# Patient Record
Sex: Female | Born: 1935 | Race: White | Hispanic: No | Smoking: Never smoker
Health system: Southern US, Community
[De-identification: ages and names within clinical notes are randomized; demographics above are authoritative.]

## PROBLEM LIST (undated history)

## (undated) DIAGNOSIS — I10 Essential (primary) hypertension: Secondary | ICD-10-CM

## (undated) DIAGNOSIS — M810 Age-related osteoporosis without current pathological fracture: Secondary | ICD-10-CM

## (undated) DIAGNOSIS — K922 Gastrointestinal hemorrhage, unspecified: Secondary | ICD-10-CM

## (undated) DIAGNOSIS — A159 Respiratory tuberculosis unspecified: Secondary | ICD-10-CM

## (undated) DIAGNOSIS — K253 Acute gastric ulcer without hemorrhage or perforation: Secondary | ICD-10-CM

## (undated) DIAGNOSIS — H269 Unspecified cataract: Secondary | ICD-10-CM

## (undated) DIAGNOSIS — T7840XA Allergy, unspecified, initial encounter: Secondary | ICD-10-CM

## (undated) HISTORY — DX: Gastrointestinal hemorrhage, unspecified: K92.2

## (undated) HISTORY — DX: Allergy, unspecified, initial encounter: T78.40XA

## (undated) HISTORY — DX: Unspecified cataract: H26.9

## (undated) HISTORY — DX: Age-related osteoporosis without current pathological fracture: M81.0

## (undated) HISTORY — DX: Acute gastric ulcer without hemorrhage or perforation: K25.3

## (undated) HISTORY — DX: Respiratory tuberculosis unspecified: A15.9

## (undated) HISTORY — PX: APPENDECTOMY: SHX54

## (undated) HISTORY — PX: OTHER SURGICAL HISTORY: SHX169

## (undated) HISTORY — PX: EYE SURGERY: SHX253

## (undated) HISTORY — PX: ABDOMINAL HYSTERECTOMY: SHX81

---
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
## 1998-04-09 ENCOUNTER — Other Ambulatory Visit: Admission: RE | Admit: 1998-04-09 | Discharge: 1998-04-09 | Payer: Self-pay

## 2003-10-21 ENCOUNTER — Other Ambulatory Visit: Admission: RE | Admit: 2003-10-21 | Discharge: 2003-10-21 | Payer: Self-pay | Admitting: Internal Medicine

## 2006-08-02 ENCOUNTER — Other Ambulatory Visit: Admission: RE | Admit: 2006-08-02 | Discharge: 2006-08-02 | Payer: Self-pay | Admitting: Internal Medicine

## 2014-11-12 DIAGNOSIS — J40 Bronchitis, not specified as acute or chronic: Secondary | ICD-10-CM | POA: Diagnosis not present

## 2014-11-12 DIAGNOSIS — M810 Age-related osteoporosis without current pathological fracture: Secondary | ICD-10-CM | POA: Diagnosis not present

## 2014-11-12 DIAGNOSIS — J0101 Acute recurrent maxillary sinusitis: Secondary | ICD-10-CM | POA: Diagnosis not present

## 2014-12-30 DIAGNOSIS — H2511 Age-related nuclear cataract, right eye: Secondary | ICD-10-CM | POA: Diagnosis not present

## 2014-12-30 DIAGNOSIS — H2512 Age-related nuclear cataract, left eye: Secondary | ICD-10-CM | POA: Diagnosis not present

## 2014-12-30 DIAGNOSIS — H3531 Nonexudative age-related macular degeneration: Secondary | ICD-10-CM | POA: Diagnosis not present

## 2014-12-30 DIAGNOSIS — H02839 Dermatochalasis of unspecified eye, unspecified eyelid: Secondary | ICD-10-CM | POA: Diagnosis not present

## 2014-12-30 DIAGNOSIS — H18411 Arcus senilis, right eye: Secondary | ICD-10-CM | POA: Diagnosis not present

## 2015-01-19 DIAGNOSIS — H25012 Cortical age-related cataract, left eye: Secondary | ICD-10-CM | POA: Diagnosis not present

## 2015-01-19 DIAGNOSIS — H25812 Combined forms of age-related cataract, left eye: Secondary | ICD-10-CM | POA: Diagnosis not present

## 2015-01-19 DIAGNOSIS — H2512 Age-related nuclear cataract, left eye: Secondary | ICD-10-CM | POA: Diagnosis not present

## 2015-01-20 DIAGNOSIS — H25041 Posterior subcapsular polar age-related cataract, right eye: Secondary | ICD-10-CM | POA: Diagnosis not present

## 2015-01-20 DIAGNOSIS — H2511 Age-related nuclear cataract, right eye: Secondary | ICD-10-CM | POA: Diagnosis not present

## 2015-01-20 DIAGNOSIS — H25011 Cortical age-related cataract, right eye: Secondary | ICD-10-CM | POA: Diagnosis not present

## 2015-02-19 DIAGNOSIS — Z803 Family history of malignant neoplasm of breast: Secondary | ICD-10-CM | POA: Diagnosis not present

## 2015-02-19 DIAGNOSIS — Z1231 Encounter for screening mammogram for malignant neoplasm of breast: Secondary | ICD-10-CM | POA: Diagnosis not present

## 2015-10-16 DIAGNOSIS — H6123 Impacted cerumen, bilateral: Secondary | ICD-10-CM | POA: Diagnosis not present

## 2015-10-16 DIAGNOSIS — H903 Sensorineural hearing loss, bilateral: Secondary | ICD-10-CM | POA: Diagnosis not present

## 2015-10-20 DIAGNOSIS — L820 Inflamed seborrheic keratosis: Secondary | ICD-10-CM | POA: Diagnosis not present

## 2015-10-20 DIAGNOSIS — Z85828 Personal history of other malignant neoplasm of skin: Secondary | ICD-10-CM | POA: Diagnosis not present

## 2015-10-20 DIAGNOSIS — C44319 Basal cell carcinoma of skin of other parts of face: Secondary | ICD-10-CM | POA: Diagnosis not present

## 2015-10-20 DIAGNOSIS — L821 Other seborrheic keratosis: Secondary | ICD-10-CM | POA: Diagnosis not present

## 2016-04-12 DIAGNOSIS — H2511 Age-related nuclear cataract, right eye: Secondary | ICD-10-CM | POA: Diagnosis not present

## 2016-04-12 DIAGNOSIS — H18412 Arcus senilis, left eye: Secondary | ICD-10-CM | POA: Diagnosis not present

## 2016-04-12 DIAGNOSIS — Z961 Presence of intraocular lens: Secondary | ICD-10-CM | POA: Diagnosis not present

## 2016-04-12 DIAGNOSIS — H353130 Nonexudative age-related macular degeneration, bilateral, stage unspecified: Secondary | ICD-10-CM | POA: Diagnosis not present

## 2016-04-12 DIAGNOSIS — H18411 Arcus senilis, right eye: Secondary | ICD-10-CM | POA: Diagnosis not present

## 2016-04-22 DIAGNOSIS — H2511 Age-related nuclear cataract, right eye: Secondary | ICD-10-CM | POA: Diagnosis not present

## 2016-05-26 DIAGNOSIS — H353132 Nonexudative age-related macular degeneration, bilateral, intermediate dry stage: Secondary | ICD-10-CM | POA: Diagnosis not present

## 2016-05-26 DIAGNOSIS — H35362 Drusen (degenerative) of macula, left eye: Secondary | ICD-10-CM | POA: Diagnosis not present

## 2016-05-26 DIAGNOSIS — H35361 Drusen (degenerative) of macula, right eye: Secondary | ICD-10-CM | POA: Diagnosis not present

## 2016-05-30 DIAGNOSIS — Z803 Family history of malignant neoplasm of breast: Secondary | ICD-10-CM | POA: Diagnosis not present

## 2016-05-30 DIAGNOSIS — Z1231 Encounter for screening mammogram for malignant neoplasm of breast: Secondary | ICD-10-CM | POA: Diagnosis not present

## 2016-05-30 DIAGNOSIS — M810 Age-related osteoporosis without current pathological fracture: Secondary | ICD-10-CM | POA: Diagnosis not present

## 2016-06-02 DIAGNOSIS — R51 Headache: Secondary | ICD-10-CM | POA: Diagnosis not present

## 2016-06-02 DIAGNOSIS — E559 Vitamin D deficiency, unspecified: Secondary | ICD-10-CM | POA: Diagnosis not present

## 2016-06-02 DIAGNOSIS — Z13820 Encounter for screening for osteoporosis: Secondary | ICD-10-CM | POA: Diagnosis not present

## 2016-06-02 DIAGNOSIS — Z0001 Encounter for general adult medical examination with abnormal findings: Secondary | ICD-10-CM | POA: Diagnosis not present

## 2016-06-09 DIAGNOSIS — Z7689 Persons encountering health services in other specified circumstances: Secondary | ICD-10-CM | POA: Diagnosis not present

## 2019-02-04 DIAGNOSIS — I10 Essential (primary) hypertension: Secondary | ICD-10-CM

## 2019-02-04 HISTORY — DX: Essential (primary) hypertension: I10

## 2019-02-14 ENCOUNTER — Encounter: Payer: Self-pay | Admitting: Family Medicine

## 2019-02-14 ENCOUNTER — Ambulatory Visit (INDEPENDENT_AMBULATORY_CARE_PROVIDER_SITE_OTHER): Payer: Medicare Other | Admitting: Family Medicine

## 2019-02-14 ENCOUNTER — Other Ambulatory Visit: Payer: Self-pay

## 2019-02-14 VITALS — BP 206/74 | HR 86 | Temp 98.1°F | Ht 60.0 in | Wt 119.6 lb

## 2019-02-14 DIAGNOSIS — I10 Essential (primary) hypertension: Secondary | ICD-10-CM | POA: Insufficient documentation

## 2019-02-14 DIAGNOSIS — R42 Dizziness and giddiness: Secondary | ICD-10-CM

## 2019-02-14 DIAGNOSIS — R030 Elevated blood-pressure reading, without diagnosis of hypertension: Secondary | ICD-10-CM | POA: Diagnosis not present

## 2019-02-14 MED ORDER — LISINOPRIL 5 MG PO TABS: ORAL_TABLET | ORAL | 0 refills | Status: DC

## 2019-02-14 NOTE — Progress Notes (Signed)
New Patient Office Visit  Subjective:  Patient ID: Gina Pace, female    DOB: Dec 03, 1924  Age: 85 y.o. MRN: [id number]  CC:  Chief Complaint  Patient presents with  . Dizziness    HPI Waylon Koffler Hardcastle presents for dizziness noted over the last few weeks- pt has not seen a doc since -07-no meds.  Eye appt next week-new glasses /exam-cataracts removed in the past-states bp noted to be slighly high at that appt Hearing aids needed-pt to call for appt Dizziness noted over the last several weeks-pt thought maybe cleaning and dust causing symptoms. Pt states stair climbing more difficult.  Pt with weakness -pre-syncope. Pt states sitting made her feel better. Pt states she eats at home-cooks her own food.  Pt states exertional exercise causes fatigue and headache.  No SOB, no CP.  O2 sat 98, pulse 69. Pt taking allegra for AR.  Pt states otc  meds for bloating. Pt states sudafed in the past for allergies.  Previously pt told her heart "skipped beats"--no cardiac work up. Pt states doctor mentioned when he listened to her heart.  ECG completed in the office and pt was told " heart was good"   Past Medical History:  Diagnosis Date  . Allergy   . Cataract   . Osteoporosis   . Tuberculosis   . Ulcer of gastric fundus, acute    No family history on file.  Social History   Socioeconomic History  . Marital status: Married    Spouse name: Not on file  . Number of children: Not on file  . Years of education: Not on file  . Highest education level: Not on file  Occupational History  . Not on file  Social Needs  . Financial resource strain: Not on file  . Food insecurity    Worry: Not on file    Inability: Not on file  . Transportation needs    Medical: Not on file    Non-medical: Not on file  Tobacco Use  . Smoking status: Never Smoker  . Smokeless tobacco: Never Used  Substance and Sexual Activity  . Alcohol use: Not on file  . Drug use: Not on file  . Sexual  activity: Not on file  Lifestyle  . Physical activity    Days per week: Not on file    Minutes per session: Not on file  . Stress: Not on file  Relationships  . Social Herbalist on phone: Not on file    Gets together: Not on file    Attends religious service: Not on file    Active member of club or organization: Not on file    Attends meetings of clubs or organizations: Not on file    Relationship status: Not on file  . Intimate partner violence    Fear of current or ex partner: Not on file    Emotionally abused: Not on file    Physically abused: Not on file    Forced sexual activity: Not on file  Other Topics Concern  . Not on file  Social History Narrative  . Not on file    ROS Review of Systems  Constitutional: Negative for fatigue and unexpected weight change.  Respiratory: Negative for shortness of breath and wheezing.   Neurological: Positive for dizziness and headaches. Negative for syncope.    Objective:   Today's Vitals: BP (!) 192/83 (BP Location: Right Arm, Patient Position: Sitting, Cuff Size: Normal)   Pulse  76   Temp 98.1 F (36.7 C) (Oral)   Ht 5' (1.524 m)   Wt 119 lb 9.6 oz (54.3 kg)   SpO2 98%   BMI 23.36 kg/m   Physical Exam Constitutional:      Appearance: Normal appearance.  HENT:     Head: Normocephalic and atraumatic.     Right Ear: Tympanic membrane normal.     Left Ear: Tympanic membrane normal.     Nose: Nose normal.     Mouth/Throat:     Mouth: Mucous membranes are moist.  Eyes:     Conjunctiva/sclera: Conjunctivae normal.  Cardiovascular:     Rate and Rhythm: Normal rate. Rhythm irregular.     Pulses: Normal pulses.     Heart sounds: Normal heart sounds.  Pulmonary:     Effort: Pulmonary effort is normal.     Breath sounds: Normal breath sounds.  Abdominal:     General: Abdomen is flat. Bowel sounds are normal.     Palpations: Abdomen is soft.  Musculoskeletal:     Right lower leg: No edema.     Left lower leg:  No edema.  Neurological:     Mental Status: She is alert and oriented to person, place, and time.  Psychiatric:        Mood and Affect: Mood normal.     Assessment & Plan:   Problem List Items Addressed This Visit    None      Outpatient Encounter Medications as of 02/14/2019  Medication Sig  . fexofenadine (ALLEGRA) 180 MG tablet Take 180 mg by mouth daily.   No facility-administered encounter medications on file as of 02/14/2019.   1. Elevated blood pressure reading without diagnosis of hypertension Gradual vs abrupt onset elevated bp-ecg no strain pattern. Pt to star lisinopril 5 mg-pt will see cardiology for evaluation-sisters see cardio for bradycardia and irregular heart beat per pt. Pt will call with name of chosen cardiology - CBC with Differential/Platelet - CMP14+EGFR - TSH - EKG 12-Lead Take lisinopril at night  2. Dizziness and giddiness Pre-syncope-concern for elevated bp vs irregular heart rhythm-, labwork pending  Follow-up: cardiology  LISA Hannah Beat, MD

## 2019-02-14 NOTE — Patient Instructions (Addendum)
     If you have lab work done today you will be contacted with your lab results within the next 2 weeks.  If you have not heard from [place] then please contact us. The fastest way to get your results is to register for My Chart. wil call for results Cardiology referral  IF you received an x-ray today, you will receive an invoice from [facility]. Please contact [facility] at [phone] with questions or concerns regarding your invoice.   IF you received labwork today, you will receive an invoice from [facility]. Please contact [facility] at [phone] with questions or concerns regarding your invoice.   Our billing staff will not be able to assist you with questions regarding bills from these companies.  You will be contacted with the lab results as soon as they are available. The fastest way to get your results is to activate your My Chart account. Instructions are located on the last page of this paperwork. If you have not heard from [place] regarding the results in 2 weeks, please contact this office.

## 2019-02-15 ENCOUNTER — Ambulatory Visit: Payer: Self-pay

## 2019-02-15 ENCOUNTER — Telehealth: Payer: Self-pay | Admitting: Cardiology

## 2019-02-15 LAB — CBC WITH DIFFERENTIAL/PLATELET
Basophils Absolute: 0.1 10*3/uL (ref 0.0–0.2)
Basos: 1 %
EOS (ABSOLUTE): 0 10*3/uL (ref 0.0–0.4)
Eos: 0 %
Hematocrit: 40.9 % (ref 34.0–46.6)
Hemoglobin: 13.1 g/dL (ref 11.1–15.9)
Immature Grans (Abs): 0 10*3/uL (ref 0.0–0.1)
Immature Granulocytes: 0 %
Lymphocytes Absolute: 2 10*3/uL (ref 0.7–3.1)
Lymphs: 29 %
MCH: 29.9 pg (ref 26.6–33.0)
MCHC: 32 g/dL (ref 31.5–35.7)
MCV: 93 fL (ref 79–97)
Monocytes Absolute: 0.4 10*3/uL (ref 0.1–0.9)
Monocytes: 6 %
Neutrophils Absolute: 4.6 10*3/uL (ref 1.4–7.0)
Neutrophils: 64 %
Platelets: 220 10*3/uL (ref 150–450)
RBC: 4.38 x10E6/uL (ref 3.77–5.28)
RDW: 12.7 % (ref 11.7–15.4)
WBC: 7.1 10*3/uL (ref 3.4–10.8)

## 2019-02-15 LAB — CMP14+EGFR
ALT: 7 IU/L (ref 0–32)
AST: 18 IU/L (ref 0–40)
Albumin/Globulin Ratio: 1.8 (ref 1.2–2.2)
Albumin: 4.6 g/dL (ref 3.5–4.6)
Alkaline Phosphatase: 85 IU/L (ref 39–117)
BUN/Creatinine Ratio: 18 (ref 12–28)
BUN: 15 mg/dL (ref 10–36)
Bilirubin Total: 0.3 mg/dL (ref 0.0–1.2)
CO2: 21 mmol/L (ref 20–29)
Calcium: 10.4 mg/dL — ABNORMAL HIGH (ref 8.7–10.3)
Chloride: 103 mmol/L (ref 96–106)
Creatinine, Ser: 0.82 mg/dL (ref 0.57–1.00)
GFR calc Af Amer: 72 mL/min/{1.73_m2} (ref >59)
GFR calc non Af Amer: 62 mL/min/{1.73_m2} (ref >59)
Globulin, Total: 2.5 g/dL (ref 1.5–4.5)
Glucose: 102 mg/dL — ABNORMAL HIGH (ref 65–99)
Potassium: 4.2 mmol/L (ref 3.5–5.2)
Sodium: 140 mmol/L (ref 134–144)
Total Protein: 7.1 g/dL (ref 6.0–8.5)

## 2019-02-15 LAB — TSH: TSH: 3.38 u[IU]/mL (ref 0.450–4.500)

## 2019-02-15 NOTE — Telephone Encounter (Signed)
Pt. And daughter reports she is still "very dizzy today. She took her new blood pressure medicine, but maybe she should only take a half a pill tonight." Encouraged to not stop her medicine - give it time to work. Pt. Eating and drinking well. Would like to know what Dr. Holly Bodily thinks.  Answer Assessment - Initial Assessment Questions 1. DESCRIPTION: "Describe your dizziness."     Lightheaded 2. LIGHTHEADED: "Do you feel lightheaded?" (e.g., somewhat faint, woozy, weak upon standing)     Yes 3. VERTIGO: "Do you feel like either you or the room is spinning or tilting?" (i.e. vertigo)     NoModerate 4. SEVERITY: "How bad is it?"  "Do you feel like you are going to faint?" "Can you stand and walk?"   - MILD - walking normally   - MODERATE - interferes with normal activities (e.g., work, school)    - SEVERE - unable to stand, requires support to walk, feels like passing out now.      Moderate 5. ONSET:  "When did the dizziness begin?"     2 weeks ago 6. AGGRAVATING FACTORS: "Does anything make it worse?" (e.g., standing, change in head position)     Standing up 7. HEART RATE: "Can you tell me your heart rate?" "How many beats in 15 seconds?"  (Note: not all patients can do this)       No 8. CAUSE: "What do you think is causing the dizziness?"     Unsure 9. RECURRENT SYMPTOM: "Have you had dizziness before?" If so, ask: "When was the last time?" "What happened that time?"     Yes 10. OTHER SYMPTOMS: "Do you have any other symptoms?" (e.g., fever, chest pain, vomiting, diarrhea, bleeding)       No 11. PREGNANCY: "Is there any chance you are pregnant?" "When was your last menstrual period?"       No  Protocols used: DIZZINESS [facility]

## 2019-02-15 NOTE — Telephone Encounter (Signed)
LVM for patient to call back and schedule an appointment with Dr. Percival Spanish.

## 2019-02-19 ENCOUNTER — Encounter: Payer: Self-pay | Admitting: *Deleted

## 2019-02-19 ENCOUNTER — Ambulatory Visit: Payer: Self-pay | Admitting: General Practice

## 2019-02-19 NOTE — Telephone Encounter (Signed)
Please see previous encounter. This encounter was created in error - please disregard.

## 2019-02-19 NOTE — Telephone Encounter (Signed)
   DA   Gina Pace  Next Appt 03/11/19 Summary: medication problem    Pt daughter called and states that pt is taking BP medication at night. Medication seems to be keeping patient up. Pt daughter would like a call back regarding. Pt BP has also been going up and down. Pt daughter would like a call back regarding.please advise         Call History   Type Contact Phone User  02/19/2019 12:27 PM EDT Phone (Incoming) Gina Pace (Child) [phone] Gina Pace   Daughter calls reporting her mother "just is not feeling better". She is not with her mother at this time. Reports she started on lisinopril  5mg  daily last Thursday.Only pressures she could report 146/70's, no pulse reported.Reports her eyes bother her to read. Unsure if vision is blurry or not. Feels weak. No fever/pain. Is not sleeping well since starting the medication. She will be seeing the patient later today. Reviewed symptoms to ask the patient and to call back with if present. Stated she understood.  Answer Assessment - Initial Assessment Questions 1. SYMPTOMS: "Do you have any symptoms?"     Patient's daughter is calling for her mother. She is not with her at this time. 2. SEVERITY: If symptoms are present, ask "Are they mild, moderate or severe?"     Daughter is unsure as she is not with patient.  Protocols used: MEDICATION QUESTION CALL-A-AH

## 2019-02-21 ENCOUNTER — Other Ambulatory Visit: Payer: Self-pay

## 2019-02-21 ENCOUNTER — Encounter (HOSPITAL_COMMUNITY): Payer: Self-pay | Admitting: *Deleted

## 2019-02-21 ENCOUNTER — Telehealth: Payer: Self-pay | Admitting: Family Medicine

## 2019-02-21 ENCOUNTER — Emergency Department (HOSPITAL_COMMUNITY)
Admission: EM | Admit: 2019-02-21 | Discharge: 2019-02-21 | Disposition: A | Discharge status: Home / Self Care | Payer: Medicare Other | Attending: Emergency Medicine | Admitting: Emergency Medicine

## 2019-02-21 DIAGNOSIS — I10 Essential (primary) hypertension: Secondary | ICD-10-CM | POA: Diagnosis not present

## 2019-02-21 DIAGNOSIS — T783XXA Angioneurotic edema, initial encounter: Secondary | ICD-10-CM | POA: Insufficient documentation

## 2019-02-21 DIAGNOSIS — Z79899 Other long term (current) drug therapy: Secondary | ICD-10-CM | POA: Diagnosis not present

## 2019-02-21 DIAGNOSIS — R51 Headache: Secondary | ICD-10-CM | POA: Diagnosis present

## 2019-02-21 LAB — BASIC METABOLIC PANEL
Anion gap: 7 (ref 5–15)
BUN: 14 mg/dL (ref 8–23)
CO2: 26 mmol/L (ref 22–32)
Calcium: 9.9 mg/dL (ref 8.9–10.3)
Chloride: 104 mmol/L (ref 98–111)
Creatinine, Ser: 0.93 mg/dL (ref 0.44–1.00)
GFR calc Af Amer: >60 mL/min (ref >60)
GFR calc non Af Amer: 53 mL/min — ABNORMAL LOW (ref >60)
Glucose, Bld: 93 mg/dL (ref 70–99)
Potassium: 4.2 mmol/L (ref 3.5–5.1)
Sodium: 137 mmol/L (ref 135–145)

## 2019-02-21 LAB — URINALYSIS, ROUTINE W REFLEX MICROSCOPIC
Bilirubin Urine: NEGATIVE
Glucose, UA: NEGATIVE mg/dL
Hgb urine dipstick: NEGATIVE
Ketones, ur: NEGATIVE mg/dL
Leukocytes,Ua: NEGATIVE
Nitrite: NEGATIVE
Protein, ur: NEGATIVE mg/dL
Specific Gravity, Urine: 1.008 (ref 1.005–1.030)
pH: 5 (ref 5.0–8.0)

## 2019-02-21 LAB — CBC
HCT: 38.8 % (ref 36.0–46.0)
Hemoglobin: 12.4 g/dL (ref 12.0–15.0)
MCH: 30.1 pg (ref 26.0–34.0)
MCHC: 32 g/dL (ref 30.0–36.0)
MCV: 94.2 fL (ref 80.0–100.0)
Platelets: 191 10*3/uL (ref 150–400)
RBC: 4.12 MIL/uL (ref 3.87–5.11)
RDW: 12.6 % (ref 11.5–15.5)
WBC: 5.6 10*3/uL (ref 4.0–10.5)
nRBC: 0 % (ref 0.0–0.2)

## 2019-02-21 LAB — TROPONIN I: Troponin I: <0.03 ng/mL (ref <0.03)

## 2019-02-21 LAB — CBG MONITORING, ED: Glucose-Capillary: 90 mg/dL (ref 70–99)

## 2019-02-21 MED ORDER — METHYLPREDNISOLONE SODIUM SUCC 125 MG IJ SOLR
125.0000 mg | Freq: Once | INTRAMUSCULAR | Status: AC
  Administered 2019-02-21: 125 mg via INTRAVENOUS
  Filled 2019-02-21: qty 2

## 2019-02-21 MED ORDER — SODIUM CHLORIDE 0.9% FLUSH
3.0000 mL | Freq: Once | INTRAVENOUS | Status: AC
  Administered 2019-02-21: 3 mL via INTRAVENOUS

## 2019-02-21 MED ORDER — FAMOTIDINE IN NACL 20-0.9 MG/50ML-% IV SOLN
20.0000 mg | Freq: Once | INTRAVENOUS | Status: AC
  Administered 2019-02-21: 20 mg via INTRAVENOUS
  Filled 2019-02-21: qty 50

## 2019-02-21 MED ORDER — PREDNISONE 10 MG (21) PO TBPK: ORAL_TABLET | ORAL | 0 refills | Status: DC

## 2019-02-21 MED ORDER — DIPHENHYDRAMINE HCL 50 MG/ML IJ SOLN
25.0000 mg | Freq: Once | INTRAMUSCULAR | Status: AC
  Administered 2019-02-21: 25 mg via INTRAVENOUS
  Filled 2019-02-21: qty 1

## 2019-02-21 MED ORDER — AMLODIPINE BESYLATE 2.5 MG PO TABS: 2.5000 mg | ORAL_TABLET | Freq: Every day | ORAL | 0 refills | Status: DC

## 2019-02-21 NOTE — ED Provider Notes (Signed)
[facility] EMERGENCY DEPARTMENT Provider Note   CSN: [id number] Arrival date & time: 02/21/19  1416    History   Chief Complaint Chief Complaint  Patient presents with  . Headache    HPI Gina Pace is a 85 y.o. female.     Pt presents to the ED today with tongue swelling.  The pt said she started lisinopril on Thursday, June 11.  She noticed some swelling in her mouth and tongue today.  She has not yet taken her dose for today as she takes it at night.  The pt denies any cp or sob.  The pt denies difficulty talking or swallowing.  The pt has had some lightheadedness and headaches for the last 2 weeks.  She did see her pcp on 6/11 for this problem.  Her doctor did a cbc, cmp, and tsh which were all nl.  Pt has also requested an appt with Dr. Percival Spanish (cards).     Past Medical History:  Diagnosis Date  . Allergy   . Cataract   . Osteoporosis   . Tuberculosis   . Ulcer of gastric fundus, acute     Patient Active Problem List   Diagnosis Date Noted  . Elevated blood pressure reading without diagnosis of hypertension 02/14/2019  . Dizziness and giddiness 02/14/2019    Past Surgical History:  Procedure Laterality Date  . ABDOMINAL HYSTERECTOMY    . APPENDECTOMY    . EYE SURGERY       OB History   No obstetric history on file.      Home Medications    Prior to Admission medications   Medication Sig Start Date End Date Taking? Authorizing Provider  amLODipine (NORVASC) 2.5 MG tablet Take 1 tablet (2.5 mg total) by mouth daily. 02/21/19   [name], MD  fexofenadine (ALLEGRA) 180 MG tablet Take 180 mg by mouth daily.    [provider]  predniSONE (STERAPRED UNI-PAK 21 TAB) 10 MG (21) TBPK tablet Take 4 for 2 days, then 3 for 2 days, 2 for 2 days, then 1 for 2 days 02/21/19   [name], MD  lisinopril (ZESTRIL) 5 MG tablet Take one po qd 02/14/19 02/21/19  [name], MD    Family History No family history on file.   Social History Social History   Tobacco Use  . Smoking status: Never Smoker  . Smokeless tobacco: Never Used  Substance Use Topics  . Alcohol use: Not on file  . Drug use: Not on file     Allergies   Penicillins   Review of Systems Review of Systems  HENT:       Tongue swelling  Neurological: Positive for weakness.  All other systems reviewed and are negative.    Physical Exam Updated Vital Signs BP (!) 160/83   Pulse 63   Temp 97.9 F (36.6 C) (Oral)   Resp 13   SpO2 99%   Physical Exam Vitals signs and nursing note reviewed.  Constitutional:      Appearance: She is well-developed.  HENT:     Head: Normocephalic and atraumatic.     Comments: Minimal swelling to tongue.    Mouth/Throat:     Mouth: Mucous membranes are moist.     Pharynx: Oropharynx is clear.  Eyes:     Extraocular Movements: Extraocular movements intact.     Pupils: Pupils are equal, round, and reactive to light.  Neck:     Musculoskeletal: Normal range of motion  and neck supple.  Cardiovascular:     Rate and Rhythm: Normal rate and regular rhythm.     Heart sounds: Normal heart sounds.  Pulmonary:     Effort: Pulmonary effort is normal.     Breath sounds: Normal breath sounds.  Abdominal:     General: Bowel sounds are normal.     Palpations: Abdomen is soft.  Skin:    General: Skin is warm and dry.     Capillary Refill: Capillary refill takes less than 2 seconds.  Neurological:     Mental Status: She is alert and oriented to person, place, and time.  Psychiatric:        Mood and Affect: Mood normal.        Speech: Speech normal.        Behavior: Behavior normal.      ED Treatments / Results  Labs (all labs ordered are listed, but only abnormal results are displayed) Labs Reviewed  BASIC METABOLIC PANEL - Abnormal; Notable for the following components:      Result Value   GFR calc non Af Amer 53 (*)    All other components within normal limits  CBC  URINALYSIS,  ROUTINE W REFLEX MICROSCOPIC  TROPONIN I  CBG MONITORING, ED    EKG EKG Interpretation  Date/Time:  Thursday February 21 2019 14:24:48 EDT Ventricular Rate:  65 PR Interval:    QRS Duration: 103 QT Interval:  429 QTC Calculation: 447 R Axis:   -18 Text Interpretation:  Sinus rhythm Atrial premature complexes Borderline left axis deviation No old tracing to compare Confirmed by [name] [id number]) on 02/21/2019 3:13:12 PM   Radiology No results found.  Procedures Procedures (including critical care time)  Medications Ordered in ED Medications  sodium chloride flush (NS) 0.9 % injection 3 mL (3 mLs Intravenous Given 02/21/19 1550)  diphenhydrAMINE (BENADRYL) injection 25 mg (25 mg Intravenous Given 02/21/19 1544)  famotidine (PEPCID) IVPB 20 mg premix (20 mg Intravenous New Bag/Given 02/21/19 1547)  methylPREDNISolone sodium succinate (SOLU-MEDROL) 125 mg/2 mL injection 125 mg (125 mg Intravenous Given 02/21/19 1550)     Initial Impression / Assessment and Plan / ED Course  I have reviewed the triage vital signs and the nursing notes.  Pertinent labs & imaging results that were available during my care of the patient were reviewed by me and considered in my medical decision making (see chart for details).     Pt is feeling much better after the above treatment.  She was monitored for several hours.  Her tongue feels normal.  She is told to stop the lisinopril and start norvasc.  She is d/c home with prednisone for her angioedema.  Return if worse.  Final Clinical Impressions(s) / ED Diagnoses   Final diagnoses:  Angioedema, initial encounter  Essential hypertension    ED Discharge Orders         Ordered    amLODipine (NORVASC) 2.5 MG tablet  Daily     02/21/19 1702    predniSONE (STERAPRED UNI-PAK 21 TAB) 10 MG (21) TBPK tablet     02/21/19 1702           [name], MD 02/21/19 1704

## 2019-02-21 NOTE — ED Notes (Signed)
Report given to [facility] R.

## 2019-02-21 NOTE — Telephone Encounter (Signed)
Copied from [place] [phone]. Topic: General - Other >> Feb 21, 2019  4:44 PM Nils Flack wrote: Reason for CRM: daughter called to let Dr Beola Cord know that pt is in the ed. She is asking for a call.  Please call [phone]

## 2019-02-21 NOTE — ED Notes (Signed)
Patient verbalizes understanding of discharge instructions. Opportunity for questioning and answers were provided. Armband removed by staff, pt discharged from ED.    Explained discharge instructions to pt's daughter Hilda Blades) as well.

## 2019-02-21 NOTE — ED Notes (Signed)
No obvious swelling to face or tongue

## 2019-02-21 NOTE — ED Triage Notes (Signed)
Pt reports having headache, weakness, lightheadedness, and feeling "swelling" in her mouth. Pt reports starting lisinipril on Thursday. Pt reports headache and lightheadedness for 2 weeks. No neuro deficits noted.

## 2019-02-22 ENCOUNTER — Other Ambulatory Visit: Payer: Self-pay

## 2019-02-22 ENCOUNTER — Encounter: Payer: Self-pay | Admitting: Family Medicine

## 2019-02-22 ENCOUNTER — Ambulatory Visit (INDEPENDENT_AMBULATORY_CARE_PROVIDER_SITE_OTHER): Payer: Medicare Other | Admitting: Family Medicine

## 2019-02-22 VITALS — BP 164/76 | HR 83 | Temp 98.0°F | Ht 60.0 in | Wt 117.0 lb

## 2019-02-22 DIAGNOSIS — T783XXD Angioneurotic edema, subsequent encounter: Secondary | ICD-10-CM | POA: Diagnosis not present

## 2019-02-22 DIAGNOSIS — I10 Essential (primary) hypertension: Secondary | ICD-10-CM

## 2019-02-22 MED ORDER — AMLODIPINE BESYLATE 5 MG PO TABS: 5.0000 mg | ORAL_TABLET | Freq: Every day | ORAL | 2 refills | Status: DC

## 2019-02-22 NOTE — Patient Instructions (Signed)
° ° ° °  If you have lab work done today you will be contacted with your lab results within the next 2 weeks.  If you have not heard from us then please contact us. The fastest way to get your results is to register for My Chart. ° ° °IF you received an x-ray today, you will receive an invoice from [facility]. Please contact [facility] at [phone] with questions or concerns regarding your invoice.  ° °IF you received labwork today, you will receive an invoice from [facility]. Please contact [facility] at [phone] with questions or concerns regarding your invoice.  ° °Our billing staff will not be able to assist you with questions regarding bills from these companies. ° °You will be contacted with the lab results as soon as they are available. The fastest way to get your results is to activate your My Chart account. Instructions are located on the last page of this paperwork. If you have not heard from us regarding the results in 2 weeks, please contact this office. °  ° ° ° °

## 2019-02-22 NOTE — Telephone Encounter (Signed)
Patient's concern/request has been addressed 

## 2019-02-22 NOTE — Telephone Encounter (Signed)
Called pt daughter to see if there were any other questions for the doctor before her appt today. The question was about the medication. Pt doesn't tolerate medication well so she usually is only able to handle the lower dose. They want to know the recommendation on prednisone.   Thanks.  FYI TO PROVIDER

## 2019-02-22 NOTE — Progress Notes (Signed)
6/19/20203:46 PM  Gina Pace October 23, 1924, 85 y.o., female [id number]  Chief Complaint  Patient presents with  . Follow-up    cannot take lisinopril, went to er yesrerday for tongue swelling. Has not taken bp med today. Did take last night wihen she received it. Has headache, does not know if this is a reaction to prednisone or norvasc    HPI:   Patient is a 85 y.o. female with past medical history significant for elevated BP who presents today for er followup  Started on lisinopril on 02/14/2019 for BP 206/74 Yesterday seen in ER for angioedema - tongue swelling BP 160/83, started on norvasc 2.5mg  rx prednisone Normal CBC, CMP and TSH  Took BP at home 134/75 Does not smoke Tongue swelling, dizziness and headache better not resolved Denies f/c/cp/sob/palpitations/cough/vision or speech changes/focal weakness  Depression screen [facility] 2/9 02/22/2019 02/14/2019  Decreased Interest 0 0  Down, Depressed, Hopeless 0 0  PHQ - 2 Score 0 0    Fall Risk  02/22/2019 02/14/2019  Falls in the past year? 0 0  Number falls in past yr: 0 0  Injury with Fall? 0 0  Follow up - Falls evaluation completed     Allergies  Allergen Reactions  . Clindamycin/Lincomycin   . Erythromycin   . Lisinopril     Angioedema   . Penicillins   . Sulfa Antibiotics     Prior to Admission medications   Medication Sig Start Date End Date Taking? Authorizing Provider  amLODipine (NORVASC) 2.5 MG tablet Take 1 tablet (2.5 mg total) by mouth daily. 02/21/19  Yes [name], MD  fexofenadine (ALLEGRA) 180 MG tablet Take 180 mg by mouth daily.   Yes [provider]  lisinopril (ZESTRIL) 5 MG tablet Take one po qd 02/14/19 02/21/19  [name], MD    Past Medical History:  Diagnosis Date  . Allergy   . Cataract   . Osteoporosis   . Tuberculosis   . Ulcer of gastric fundus, acute     Past Surgical History:  Procedure Laterality Date  . ABDOMINAL HYSTERECTOMY    . APPENDECTOMY     . EYE SURGERY      Social History   Tobacco Use  . Smoking status: Never Smoker  . Smokeless tobacco: Never Used  Substance Use Topics  . Alcohol use: Not on file    History reviewed. No pertinent family history.  ROS   OBJECTIVE:  Today's Vitals   02/22/19 1532 02/22/19 1613  BP: (!) 176/86 (!) 164/76  Pulse: 83   Temp: 98 F (36.7 C)   TempSrc: Oral   SpO2: 98%   Weight: 117 lb (53.1 kg)   Height: 5' (1.524 m)    Body mass index is 22.85 kg/m.   Physical Exam Vitals signs and nursing note reviewed.  Constitutional:      Appearance: She is well-developed.  HENT:     Head: Normocephalic and atraumatic.     Mouth/Throat:     Pharynx: No oropharyngeal exudate.     Comments: Mnimal tongue swelling, edges  Eyes:     General: No scleral icterus.    Conjunctiva/sclera: Conjunctivae normal.     Pupils: Pupils are equal, round, and reactive to light.  Neck:     Musculoskeletal: Neck supple.  Cardiovascular:     Rate and Rhythm: Normal rate and regular rhythm.     Heart sounds: Normal heart sounds. No murmur. No friction rub. No gallop.   Pulmonary:  Effort: Pulmonary effort is normal.     Breath sounds: Normal breath sounds. No wheezing or rales.  Skin:    General: Skin is warm and dry.  Neurological:     Mental Status: She is alert and oriented to person, place, and time.     ASSESSMENT and PLAN  1. Essential hypertension Elevated, increase amlodipine 5mg , cont home BP monitoring, nurse BP check in 1 week with BP cuff check - Care order/instruction:  2. Angioedema, subsequent encounter Improving, complete prednisone  Other orders - amLODipine (NORVASC) 5 MG tablet; Take 1 tablet (5 mg total) by mouth daily.  Return for as scheduled.    Rutherford Guys, MD [facility] [place], [place] [address] Ph.  [phone] Fax [phone]

## 2019-02-25 ENCOUNTER — Encounter: Payer: Self-pay | Admitting: *Deleted

## 2019-02-25 ENCOUNTER — Ambulatory Visit: Payer: Self-pay

## 2019-02-25 NOTE — Telephone Encounter (Signed)
Patient's daughter Jackelyn Poling called and says her mother's blood pressure this morning at 0900 was 174/90, at 1300 it was 172/90. She says her mother took Prednisone at 0900 and 1300. She says she has a headache that's been ongoing for several weeks and at 1100 and 1500, she took Tylenol 1 tab each time. She says at 1700 she took Allegra 1/2 tab for her allergies. She says now her BP is 179/89 and she still has a headache with no other symptoms. She asks is the prednisone causing the high blood pressure, she says her mother asked her if she should take 1/2 amlodipine in the morning and the other 1/2 at night, which she normally takes the amlodipine 5 mg at night. I advised with the BP being high like that all day and she still has a headache, she should go to the ED for evaluation, her daughter verbalized understanding and says she will let her know.  Reason for Disposition . [6] Systolic BP  >= 314 OR Diastolic >= 970 AND [2] cardiac or neurologic symptoms (e.g., chest pain, difficulty breathing, unsteady gait, blurred vision)  Answer Assessment - Initial Assessment Questions 1. BLOOD PRESSURE: "What is the blood pressure?" "Did you take at least two measurements 5 minutes apart?"     174/90; 172/90 2. ONSET: "When did you take your blood pressure?"     9 am and 1 pm today 3. HOW: "How did you obtain the blood pressure?" (e.g., visiting nurse, automatic home BP monitor)     Automatic home BP monitor 4. HISTORY: "Do you have a history of high blood pressure?"     Yes 5. MEDICATIONS: "Are you taking any medications for blood pressure?" "Have you missed any doses recently?"      Yes, not missed any medications 6. OTHER SYMPTOMS: "Do you have any symptoms?" (e.g., headache, chest pain, blurred vision, difficulty breathing, weakness)      Headache 7. PREGNANCY: "Is there any chance you are pregnant?" "When was your last menstrual period?"     No  Protocols used: HIGH BLOOD PRESSURE-A-AH

## 2019-02-25 NOTE — Telephone Encounter (Signed)
This encounter was created in error - please disregard.

## 2019-02-25 NOTE — Telephone Encounter (Signed)
Patient's daughter called back and asked if the note will be sent to the doctor about her BP. I advised yes the note will be sent, but I recommend her mother to go to the ED for evaluation of the elevated BP. I advised if it gets higher, she could possibly have a stroke, so if she doesn't go tonight, she will need to be closely monitored for any stroke symptoms-blurred vision, confusion, difficulty speech, weakness/numbess on one side of the body; chest pain, difficulty breathing, worsening of the BP she needs to go by ambulance to the ED. Her daughter verbalized understanding and says she will call her mom now and let her know what I suggested. I advised if her mom refuses to go, to call back in the morning so that the call can be sent to the office to speak to someone in the office, she verbalized understanding.

## 2019-02-26 ENCOUNTER — Telehealth: Payer: Self-pay | Admitting: Cardiology

## 2019-02-26 NOTE — Telephone Encounter (Signed)
° ° °  Pt c/o medication issue:  1. Name of Medication: amLODipine (NORVASC) 5 MG tablet  2. How are you currently taking this medication (dosage and times per day)? 2 2.5mg  tablets once daily    3. Are you having a reaction (difficulty breathing--STAT)? Not sure  4. What is your medication issue? Possible allergic reaction  Daughter states that the patient was taking 5mg  of lisinopril daily for her bp. The patient had a reaction to this medication, so she was transported to the hospital via ambulance on Thursday 02/21/19. The doctor at the hospital switched her medication to amlodipine and told her to take 2, 2.5mg  tablets (5 mg total) once daily. At the hospital, the patient was also given prednisone for the allergic reaction, but the daughter states that the patient also reacts to the prednisone. The daughter is giving the patient 1 tablet of prednisone daily. The daughter notes that the patient is still having some swelling in the tongue and face, and she is not sure if that is a result of the prednisone or the amlodipine.   The patient has a virtual appointment scheduled with Dr. Stanford Breed on 07/21 at 10:40 am. The daughter was hoping that the patient could be seen sooner and in person by Dr. Stanford Breed.

## 2019-02-26 NOTE — Telephone Encounter (Signed)
Spoke with daughter - they are more concerned about continued elevated high pressure as well as her still not feeling her best.  Tongue swelling has continued to go down, but her BP is still elevated.  Swelling has not increased since starting the amlodipine.    Advised daughter that her elevated BP is most likely due to the prednisone at this time.  We won't know the full effect of amlodipine until she has been off the prednisone for a week or two.  Gave daughter instructions on how to check home BP, including to not take it more than twice daily.   They should keep a record of her BP readings and bring to MD appointment with [facility].  She should not stop the amlodipine.    All questions answered, family feeling better about situation.

## 2019-02-26 NOTE — Telephone Encounter (Signed)
Please advise?  I dont see active allergies on her list- but we can add them.

## 2019-02-27 ENCOUNTER — Other Ambulatory Visit: Payer: Self-pay

## 2019-02-27 ENCOUNTER — Emergency Department (HOSPITAL_COMMUNITY)
Admission: EM | Admit: 2019-02-27 | Discharge: 2019-02-28 | Disposition: A | Discharge status: Home / Self Care | Payer: Medicare Other | Attending: Emergency Medicine | Admitting: Emergency Medicine

## 2019-02-27 ENCOUNTER — Emergency Department (HOSPITAL_COMMUNITY): Payer: Medicare Other

## 2019-02-27 ENCOUNTER — Encounter (HOSPITAL_COMMUNITY): Payer: Self-pay | Admitting: Emergency Medicine

## 2019-02-27 DIAGNOSIS — I10 Essential (primary) hypertension: Secondary | ICD-10-CM | POA: Diagnosis not present

## 2019-02-27 DIAGNOSIS — R200 Anesthesia of skin: Secondary | ICD-10-CM | POA: Diagnosis not present

## 2019-02-27 DIAGNOSIS — R51 Headache: Secondary | ICD-10-CM | POA: Insufficient documentation

## 2019-02-27 DIAGNOSIS — R918 Other nonspecific abnormal finding of lung field: Secondary | ICD-10-CM | POA: Diagnosis not present

## 2019-02-27 DIAGNOSIS — R5383 Other fatigue: Secondary | ICD-10-CM | POA: Diagnosis not present

## 2019-02-27 DIAGNOSIS — R42 Dizziness and giddiness: Secondary | ICD-10-CM | POA: Insufficient documentation

## 2019-02-27 LAB — CBC
HCT: 40.9 % (ref 36.0–46.0)
Hemoglobin: 13.3 g/dL (ref 12.0–15.0)
MCH: 30.2 pg (ref 26.0–34.0)
MCHC: 32.5 g/dL (ref 30.0–36.0)
MCV: 92.7 fL (ref 80.0–100.0)
Platelets: 281 10*3/uL (ref 150–400)
RBC: 4.41 MIL/uL (ref 3.87–5.11)
RDW: 12.4 % (ref 11.5–15.5)
WBC: 7.9 10*3/uL (ref 4.0–10.5)
nRBC: 0 % (ref 0.0–0.2)

## 2019-02-27 LAB — BASIC METABOLIC PANEL
Anion gap: 7 (ref 5–15)
BUN: 16 mg/dL (ref 8–23)
CO2: 25 mmol/L (ref 22–32)
Calcium: 10.2 mg/dL (ref 8.9–10.3)
Chloride: 104 mmol/L (ref 98–111)
Creatinine, Ser: 0.85 mg/dL (ref 0.44–1.00)
GFR calc Af Amer: >60 mL/min (ref >60)
GFR calc non Af Amer: 59 mL/min — ABNORMAL LOW (ref >60)
Glucose, Bld: 100 mg/dL — ABNORMAL HIGH (ref 70–99)
Potassium: 4.6 mmol/L (ref 3.5–5.1)
Sodium: 136 mmol/L (ref 135–145)

## 2019-02-27 NOTE — ED Notes (Signed)
Gina Pace, daughter- [phone] for updates

## 2019-02-27 NOTE — ED Triage Notes (Signed)
BIB [facility] from home with c/o of continued HTN. Seen here for angioedema from Linsopril and switched to Amilodipine. Pt seen and again and increased dose to 5mg  of Amio. Pt experiencing increased weakness, and some facial numbness with blood pressures remaining elevated. Initial pressure of 208/90 with EMS. Down to 176/85 just before arrival. Pt took her 5mg  PTA.

## 2019-02-28 ENCOUNTER — Encounter (HOSPITAL_COMMUNITY): Payer: Self-pay | Admitting: Radiology

## 2019-02-28 ENCOUNTER — Emergency Department (HOSPITAL_COMMUNITY): Payer: Medicare Other

## 2019-02-28 MED ORDER — IOHEXOL 300 MG/ML  SOLN
75.0000 mL | Freq: Once | INTRAMUSCULAR | Status: AC | PRN
  Administered 2019-02-28: 75 mL via INTRAVENOUS

## 2019-02-28 NOTE — ED Provider Notes (Signed)
[facility] EMERGENCY DEPARTMENT Provider Note   CSN: [id number] Arrival date & time: 02/27/19  2111     History   Chief Complaint Chief Complaint  Patient presents with  . Hypertension    HPI Gina Pace is a 85 y.o. female.     Patient in with concerns for high blood pressure.  Patient was seen June 18 and had angioedema swelling of her tongue probably secondary to her lisinopril that was stopped and patient was started on amlodipine.  Initially 2.5 mg a day.  Seen by her primary care doctor on June 19 the next day at [facility] urgent care and was increased to 5 mg a day.  They have her taking that at night before she goes to bed.  Patient has been very concerned about her blood pressure and she is checking it frequently sometimes 3 times a day and has noticed that it is high.  She has had for a while and intermittent headache on top of her head.  And then sometimes has some numbness or tingling and lightheadedness feeling.  The tingling is in the lower extremities bilaterally.  The headache is not severe.  No true muscle weakness.  She still feels as if she has some slight swelling of her tongue.  Her primary care doctor is having her keep a blood pressure log.  On arrival here tonight her blood pressure was 199/83.  Through observation here patient did take her medicine right before she came in.  Patient denies any severe headache chest pain shortness of breath any visual changes.  No fevers or upper respiratory symptoms.  Patient is finishing a course of prednisone that she was put on for the angioedema.     Past Medical History:  Diagnosis Date  . Allergy   . Cataract   . Osteoporosis   . Tuberculosis   . Ulcer of gastric fundus, acute     Patient Active Problem List   Diagnosis Date Noted  . Elevated blood pressure reading without diagnosis of hypertension 02/14/2019  . Dizziness and giddiness 02/14/2019    Past Surgical History:  Procedure  Laterality Date  . ABDOMINAL HYSTERECTOMY    . APPENDECTOMY    . EYE SURGERY       OB History   No obstetric history on file.      Home Medications    Prior to Admission medications   Medication Sig Start Date End Date Taking? Authorizing Provider  amLODipine (NORVASC) 5 MG tablet Take 1 tablet (5 mg total) by mouth daily. 02/22/19   Rutherford Guys, MD  fexofenadine (ALLEGRA) 180 MG tablet Take 180 mg by mouth daily.    [provider]  lisinopril (ZESTRIL) 5 MG tablet Take one po qd 02/14/19 02/21/19  [name], MD    Family History History reviewed. No pertinent family history.  Social History Social History   Tobacco Use  . Smoking status: Never Smoker  . Smokeless tobacco: Never Used  Substance Use Topics  . Alcohol use: Yes    Comment: 1 Glass of Scotch a Night  . Drug use: Never     Allergies   Clindamycin/lincomycin, Erythromycin, Lisinopril, Penicillins, and Sulfa antibiotics   Review of Systems Review of Systems  Constitutional: Positive for fatigue. Negative for chills and fever.  HENT: Negative for rhinorrhea and sore throat.   Eyes: Negative for visual disturbance.  Respiratory: Negative for cough and shortness of breath.   Cardiovascular: Negative for chest pain  and leg swelling.  Gastrointestinal: Negative for abdominal pain, diarrhea, nausea and vomiting.  Genitourinary: Negative for dysuria.  Musculoskeletal: Negative for back pain and neck pain.  Skin: Negative for rash.  Neurological: Positive for light-headedness, numbness and headaches. Negative for dizziness.  Hematological: Does not bruise/bleed easily.  Psychiatric/Behavioral: Negative for confusion.     Physical Exam Updated Vital Signs BP (!) 146/67   Pulse 65   Temp 98.1 F (36.7 C) (Oral)   Resp 11   Ht 1.524 m (5')   Wt 53.1 kg   SpO2 97%   BMI 22.85 kg/m   Physical Exam Vitals signs and nursing note reviewed.  Constitutional:      General: She is not in  acute distress.    Appearance: She is well-developed.  HENT:     Head: Normocephalic and atraumatic.  Eyes:     Extraocular Movements: Extraocular movements intact.     Conjunctiva/sclera: Conjunctivae normal.     Pupils: Pupils are equal, round, and reactive to light.  Neck:     Musculoskeletal: Normal range of motion and neck supple. No neck rigidity.  Cardiovascular:     Rate and Rhythm: Normal rate and regular rhythm.     Heart sounds: No murmur.  Pulmonary:     Effort: Pulmonary effort is normal. No respiratory distress.     Breath sounds: Normal breath sounds.  Abdominal:     General: Bowel sounds are normal.     Palpations: Abdomen is soft.     Tenderness: There is no abdominal tenderness.  Musculoskeletal: Normal range of motion.        General: No swelling.  Skin:    General: Skin is warm and dry.  Neurological:     General: No focal deficit present.     Mental Status: She is alert and oriented to person, place, and time.     Cranial Nerves: No cranial nerve deficit.     Sensory: No sensory deficit.     Motor: No weakness.      ED Treatments / Results  Labs (all labs ordered are listed, but only abnormal results are displayed) Labs Reviewed  BASIC METABOLIC PANEL - Abnormal; Notable for the following components:      Result Value   Glucose, Bld 100 (*)    GFR calc non Af Amer 59 (*)    All other components within normal limits  CBC    EKG    Radiology Dg Chest 2 View  Result Date: 02/27/2019 CLINICAL DATA:  85 year old female with hypertension. EXAM: CHEST - 2 VIEW COMPARISON:  None. FINDINGS: There is emphysematous changes of the lungs. Focal opacity in the posterior right lung base seen on the lateral view may represent atelectasis or infiltrate. Underlying mass is not excluded. Clinical correlation is recommended. CT may provide better evaluation if clinically indicated. The left lung is clear. There is no pleural effusion or pneumothorax. The cardiac  silhouette is within normal limits. The aorta is tortuous. No acute osseous pathology. IMPRESSION: Focal opacity in the posterior right lung base. Electronically Signed   By: Anner Crete M.D.   On: 02/27/2019 23:32   Ct Head Wo Contrast  Result Date: 02/27/2019 CLINICAL DATA:  85 year old female with chronic headache. EXAM: CT HEAD WITHOUT CONTRAST TECHNIQUE: Contiguous axial images were obtained from the base of the skull through the vertex without intravenous contrast. COMPARISON:  None. FINDINGS: Brain: There is mild age-related atrophy and chronic microvascular ischemic changes. There is no acute intracranial  hemorrhage. No mass effect midline shift. No extra-axial fluid collection. Vascular: No hyperdense vessel or unexpected calcification. Skull: Normal. Negative for fracture or focal lesion. Sinuses/Orbits: No acute finding. Other: None IMPRESSION: 1. No acute intracranial hemorrhage. 2. Mild age-related atrophy and chronic microvascular ischemic changes. Electronically Signed   By: Anner Crete M.D.   On: 02/27/2019 23:26   Ct Chest W Contrast  Result Date: 02/28/2019 CLINICAL DATA:  Pneumonia. EXAM: CT CHEST WITH CONTRAST TECHNIQUE: Multidetector CT imaging of the chest was performed during intravenous contrast administration. CONTRAST:  32mL OMNIPAQUE IOHEXOL 300 MG/ML  SOLN COMPARISON:  None. FINDINGS: Cardiovascular: The main right and left pulmonary arteries are dilated. The ascending aorta is mildly ectatic. Coronary artery calcifications are noted. The heart size is enlarged. Mild aortic calcifications are noted. Mediastinum/Nodes: No enlarged mediastinal, hilar, or axillary lymph nodes. Thyroid gland, trachea, and esophagus demonstrate no significant findings. Lungs/Pleura: There is no pneumothorax. No significant pleural effusion. The trachea is unremarkable. There is atelectasis at the left lung base. 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 Upper Abdomen: There are a few  hyperdense cyst arising from the left kidney. There is a trace amount of free fluid in the upper abdomen. Musculoskeletal: No chest wall abnormality. No acute or significant osseous findings. IMPRESSION: 1. Left basilar atelectasis which likely explains the finding seen on recent chest x-ray. There is no pneumonia. 2. Dilated pulmonary arteries which can be seen in patients with elevated pulmonary artery pressures. 3. Cardiomegaly. Aortic Atherosclerosis (ICD10-I70.0). Electronically Signed   By: Constance Holster M.D.   On: 02/28/2019 00:47    Procedures Procedures (including critical care time)  Medications Ordered in ED Medications  iohexol (OMNIPAQUE) 300 MG/ML solution 75 mL (75 mLs Intravenous Contrast Given 02/28/19 0021)     Initial Impression / Assessment and Plan / ED Course  I have reviewed the triage vital signs and the nursing notes.  Pertinent labs & imaging results that were available during my care of the patient were reviewed by me and considered in my medical decision making (see chart for details).       Work-up for the high blood pressure which corrected itself pretty much on its own from the medicine she is supposed to take at night.  The 5 mg of her amlodipine.  Pressure came down nicely to 146/67.  Patient had CT head without any acute findings.  Patient's basic labs without any significant abnormality chest x-ray raise some concern about an opacity so CT chest was done with contrast.  And it showed no abnormalities.  Patient can go home keep her blood pressure log and make an follow-up appointment with her primary care doctors at [facility].  Continue on her current blood pressure medicines.  She may need some adjustments.  If her numbness and headaches persist MRI may be appropriate but do not feel it needs to be done tonight.  No concerns for an acute stroke.  The symptoms are intermittent.  And do not fit a stroke pattern.  Patient nontoxic no acute distress.   Final  Clinical Impressions(s) / ED Diagnoses   Final diagnoses:  Essential hypertension    ED Discharge Orders    None       Fredia Sorrow, MD 02/28/19 [phone]

## 2019-02-28 NOTE — Discharge Instructions (Addendum)
Work-up here today without any acute findings.  Your blood pressure improved significantly now down to 146/67 which is almost normal.  Without any particular intervention.  Recommend that she follow back up with [facility] urgent care.  Recommend that you measure your blood pressure once daily in the afternoon and keep a log.  Labs here today without any significant abnormality CT of your head chest x-rays raise some concerns for an opacity but CT chest showed no abnormalities.  Return for any severe chest pain severe headache or any strokelike symptoms.  Or difficulty breathing.  Otherwise follow-up with [facility] or urgent care.  For further fine-tuning of your blood pressure.

## 2019-02-28 NOTE — Telephone Encounter (Signed)
Follow up   Patient's daughter states that her mother went to the ER last night and was released. Please call to discuss at [phone].

## 2019-02-28 NOTE — ED Notes (Signed)
Patient verbalizes understanding of discharge instructions. Opportunity for questioning and answers were provided. Armband removed by staff, pt discharged from ED ambulatory.   

## 2019-03-01 ENCOUNTER — Telehealth: Payer: Self-pay | Admitting: Family Medicine

## 2019-03-01 ENCOUNTER — Ambulatory Visit: Payer: Self-pay

## 2019-03-01 NOTE — Telephone Encounter (Signed)
Per Dr. [name] I called pt and talked with her daughter Jackelyn Poling and stated- have your mother take 5 mg Amlodipine now and 10 mg amlodipine tomorrow morning and every morning until further notice. I also stated to record b/p daily to discuss readings at Monday appointment . Pt daughter states understand.

## 2019-03-01 NOTE — Telephone Encounter (Signed)
I talked with pt's daughterI stated due to her mom symptoms she should be seen at the ED.   Pt daughter  states that her mom is not doing any better nor worse then she was when she was at the ED on 02/27/2019. She states that her mom b/p is elevated and having head pressure more so when sitting up. Pt daughter states that she's not sure if pt b/p medication is to much enough or to much. And would like to speak with pt's doctor

## 2019-03-01 NOTE — Telephone Encounter (Signed)
I have not seen this pt before; schedule APP appt Kirk Ruths

## 2019-03-01 NOTE — Telephone Encounter (Signed)
Pt's daughter is calling to cancel Gina Pace's nurse appointment today at 1:20. Pt was back in ED on 02/27/19 for body and headaches. Her bp was over 200 at home, 210 at ED and 140 four hours later. Today she is not doing well, feeling weak, and can not get out of bed without help. She is questioning if the amLODipine (NORVASC) 5 MG tablet [[id number]] is effective.  Debbie(daughter) would like a CB concerning this issue. Mom does have a telemed appointment with [place] on 03/04/19. Please advise at [phone].

## 2019-03-01 NOTE — Telephone Encounter (Signed)
Called and spoke with daughter- she states that her mother is still having issues with her BP- they went by EMS to ER Wednesday night, because her top number was 208/? And then rechecked at hospital was 210/?Marland Kitchen They allowed her to leave when the BP was 146/67. She states yesterday the BP was 165/76 and her mother complaints again of her head pounding, and feeling very weak. Patient daughter state she takes the Amlodipine 2 tablets at night, (not the same as med list that is daily) they are wanting to know if a sooner appointment could be made- or what else needs to be done.  Will route to MD, Nurse and PharmD.

## 2019-03-01 NOTE — Telephone Encounter (Signed)
Called patient, spoke with daughter- made appointment with APP. Patient daughter thankful for call.

## 2019-03-01 NOTE — Telephone Encounter (Signed)
Daughter called to report pt. Continues to have increased pressure in top of head with sitting up.  Rated pressure in top of head at 9/10 with sitting, and 3-4/10 with laying down.  Reported head feels "woozy with turning quickly."  Reported she had tingling in left arm and left side of face.  Daughter stated there is no facial droop, and speech is clear.  Reported pt. denied any tingling or weakness in lower extremities.  Denied chest pain or shortness of breath.  BP 164/84, P. 86 @ 3:30 PM, and BP 198/87, P. 78 at 3:45 PM.  Reported in ER on Wed. And they sent her home and advised that her PCP needs to regulate her BP medication.  Reported last dose of BP medication was 7:30 PM; took Amlodipine 5 mg.  Pt. Has been laying most of the day, due to increased pressure in her head.  Advised daughter that pt. Should be reevaluated in the ER with current BP and symptoms.  Daughter stated she did not want to take her back to the ER, due to the fact that they will not admit her and will tell her it is the PCP responsibility to manage the BP.  Noted a call was placed to office and routed to Dr. Ardyth Gal team this morning at 10:21 AM, and daughter has not rec'd return call.   Call placed to office at 3:55 PM; spoke with Vaughan Basta, CMA.  She will return call to pt. And her daughter, after speaking with Dr. [name].  Daughter advised of above, and agreed with plan.        Reason for Disposition . [1] SEVERE headache (e.g., excruciating) AND [2] not improved after 2 hours of pain medicine  Answer Assessment - Initial Assessment Questions 1. LOCATION: "Where does it hurt?"      Pressure in head on top of head for a few weeks;  2. ONSET: "When did the headache start?" (Minutes, hours or days)     Worsening of pressure in head on Wed. Evening.   3. PATTERN: "Does the pain come and go, or has it been constant since it started?"     It becomes mild when laying down  4. SEVERITY: "How bad is the pain?" and "What does it  keep you from doing?"  (e.g., Scale 1-10; mild, moderate, or severe)   - MILD (1-3): doesn't interfere with normal activities    - MODERATE (4-7): interferes with normal activities or awakens from sleep    - SEVERE (8-10): excruciating pain, unable to do any normal activities        Sitting up in bed and pain is 9/10 5. RECURRENT SYMPTOM: "Have you ever had headaches before?" If so, ask: "When was the last time?" and "What happened that time?"      yes 6. CAUSE: "What do you think is causing the headache?"     Unknown  7. MIGRAINE: "Have you been diagnosed with migraine headaches?" If so, ask: "Is this headache similar?"     Hx of migraines 8. HEAD INJURY: "Has there been any recent injury to the head?"      No 9. OTHER SYMPTOMS: "Do you have any other symptoms?" (fever, stiff neck, eye pain, sore throat, cold symptoms)    Pressure in top of the head, tingling in left arm, and some tingling in left face, stated she was laying on the left side that caused the numbness, no speech difficulty, denied chest pain or shortness of breath. Reports "woozy  feeling if turns head quickly"; BP 164/84, P 86 @ 3:30;  198/87, P 78   10. PREGNANCY: "Is there any chance you are pregnant?" "When was your last menstrual period?"      N/a  Protocols used: HEADACHE-A-AH

## 2019-03-01 NOTE — Telephone Encounter (Signed)
Follow up ° ° °Patient's daughter is returning your call. Please call. °

## 2019-03-01 NOTE — Telephone Encounter (Signed)
Called patient daughter- LVM advising to call back, left call back number.

## 2019-03-02 ENCOUNTER — Encounter (HOSPITAL_COMMUNITY): Payer: Self-pay | Admitting: Emergency Medicine

## 2019-03-02 ENCOUNTER — Emergency Department (HOSPITAL_COMMUNITY)
Admission: EM | Admit: 2019-03-02 | Discharge: 2019-03-02 | Disposition: A | Discharge status: Home / Self Care | Payer: Medicare Other | Attending: Emergency Medicine | Admitting: Emergency Medicine

## 2019-03-02 ENCOUNTER — Other Ambulatory Visit: Payer: Self-pay

## 2019-03-02 DIAGNOSIS — I160 Hypertensive urgency: Secondary | ICD-10-CM

## 2019-03-02 DIAGNOSIS — R42 Dizziness and giddiness: Secondary | ICD-10-CM | POA: Diagnosis present

## 2019-03-02 DIAGNOSIS — Z79899 Other long term (current) drug therapy: Secondary | ICD-10-CM | POA: Diagnosis not present

## 2019-03-02 LAB — CBC WITH DIFFERENTIAL/PLATELET
Abs Immature Granulocytes: 0.1 10*3/uL — ABNORMAL HIGH (ref 0.00–0.07)
Basophils Absolute: 0.1 10*3/uL (ref 0.0–0.1)
Basophils Relative: 1 %
Eosinophils Absolute: 0.1 10*3/uL (ref 0.0–0.5)
Eosinophils Relative: 1 %
HCT: 42.9 % (ref 36.0–46.0)
Hemoglobin: 13.3 g/dL (ref 12.0–15.0)
Immature Granulocytes: 1 %
Lymphocytes Relative: 33 %
Lymphs Abs: 2.3 10*3/uL (ref 0.7–4.0)
MCH: 29.4 pg (ref 26.0–34.0)
MCHC: 31 g/dL (ref 30.0–36.0)
MCV: 94.9 fL (ref 80.0–100.0)
Monocytes Absolute: 0.5 10*3/uL (ref 0.1–1.0)
Monocytes Relative: 7 %
Neutro Abs: 3.9 10*3/uL (ref 1.7–7.7)
Neutrophils Relative %: 57 %
Platelets: 249 10*3/uL (ref 150–400)
RBC: 4.52 MIL/uL (ref 3.87–5.11)
RDW: 12.7 % (ref 11.5–15.5)
WBC: 6.9 10*3/uL (ref 4.0–10.5)
nRBC: 0 % (ref 0.0–0.2)

## 2019-03-02 LAB — URINALYSIS, ROUTINE W REFLEX MICROSCOPIC
Bacteria, UA: NONE SEEN
Bilirubin Urine: NEGATIVE
Glucose, UA: NEGATIVE mg/dL
Ketones, ur: NEGATIVE mg/dL
Leukocytes,Ua: NEGATIVE
Nitrite: NEGATIVE
Protein, ur: NEGATIVE mg/dL
Specific Gravity, Urine: 1.003 — ABNORMAL LOW (ref 1.005–1.030)
pH: 7 (ref 5.0–8.0)

## 2019-03-02 LAB — COMPREHENSIVE METABOLIC PANEL
ALT: 8 U/L (ref 0–44)
AST: 14 U/L — ABNORMAL LOW (ref 15–41)
Albumin: 3.9 g/dL (ref 3.5–5.0)
Alkaline Phosphatase: 65 U/L (ref 38–126)
Anion gap: 8 (ref 5–15)
BUN: 14 mg/dL (ref 8–23)
CO2: 24 mmol/L (ref 22–32)
Calcium: 9.8 mg/dL (ref 8.9–10.3)
Chloride: 106 mmol/L (ref 98–111)
Creatinine, Ser: 0.85 mg/dL (ref 0.44–1.00)
GFR calc Af Amer: >60 mL/min (ref >60)
GFR calc non Af Amer: 59 mL/min — ABNORMAL LOW (ref >60)
Glucose, Bld: 100 mg/dL — ABNORMAL HIGH (ref 70–99)
Potassium: 4.1 mmol/L (ref 3.5–5.1)
Sodium: 138 mmol/L (ref 135–145)
Total Bilirubin: 0.4 mg/dL (ref 0.3–1.2)
Total Protein: 6.5 g/dL (ref 6.5–8.1)

## 2019-03-02 LAB — MAGNESIUM: Magnesium: 2.4 mg/dL (ref 1.7–2.4)

## 2019-03-02 NOTE — ED Triage Notes (Signed)
Pt BIBA from home c/o dizziness, angioedema as a recurrent issue for the past few weeks.  EMS reports giving 50 mg IV Benadryl 1758, which pt reported some relief of tongue swelling.  Pt reports dealing with both of these issues ongoing for the last few weeks.

## 2019-03-02 NOTE — ED Notes (Signed)
Bed: WA20 Expected date:  Expected time:  Means of arrival:  Comments: 84 yo Dizziness, HTN

## 2019-03-02 NOTE — ED Triage Notes (Signed)
Brought in by EMS after feeling like tongue was heavy. This happened once before after taking Lipitor.  This time pt believes it was amlodipine that caused the swelling.  Pt states that she was talking prednisone in the morning for the previous swelling of her tongue and she thinks that this caused her hypertension. She took Facilities manager around 1330 today.  She took the amlodipine.

## 2019-03-02 NOTE — ED Provider Notes (Signed)
[facility] DEPT Provider Note   CSN: [id number] Arrival date & time: 03/02/19  1817    History   Chief Complaint Chief Complaint  Patient presents with  . Hypertension  . Dizziness    HPI TWYLLA ARCENEAUX is a 85 y.o. female.     HPI Patient presents for the third time in little more than 1 week, now with concern for ongoing generalized weakness, dizziness and tongue swelling. Patient states that she is generally well, does not take medication regularly, but not long ago after she was found to have hypertension she was started on lisinopril. Subsequently, she developed angioedema, and her first ED visit resulted in change in her antihypertensive medication, and initiation of Benadryl, with recommendation for prednisone.  Subsequently, the patient returned, 2 days ago, with concern of dizziness, generalized weakness. She notes that since that visit she has had persistent generalized weakness, but has remained ambulatory, moving all extremities in a normal fashion, though with perception of weakness throughout.  No fever, no dyspnea Today, the patient felt as though her tongue was swelling again, and she presents for evaluation. History is provided by the patient herself, and the patient's son, via nursing staff. Today with the concern of tongue swelling she received Benadryl, 50 mg, IV.  Past Medical History:  Diagnosis Date  . Allergy   . Cataract   . Osteoporosis   . Tuberculosis   . Ulcer of gastric fundus, acute     Patient Active Problem List   Diagnosis Date Noted  . Elevated blood pressure reading without diagnosis of hypertension 02/14/2019  . Dizziness and giddiness 02/14/2019    Past Surgical History:  Procedure Laterality Date  . ABDOMINAL HYSTERECTOMY    . APPENDECTOMY    . EYE SURGERY       OB History   No obstetric history on file.      Home Medications    Prior to Admission medications   Medication Sig Start  Date End Date Taking? Authorizing Provider  amLODipine (NORVASC) 5 MG tablet Take 1 tablet (5 mg total) by mouth daily. 02/22/19   Rutherford Guys, MD  fexofenadine (ALLEGRA) 180 MG tablet Take 180 mg by mouth daily.    [provider]  predniSONE (DELTASONE) 10 MG tablet Take 10 mg by mouth See admin instructions. Take 40mg  by mouth for 2 days, then 30mg  by mouth for 2 days, then 10mg  by mouth for 2 days 02/21/19   [provider]  lisinopril (ZESTRIL) 5 MG tablet Take one po qd 02/14/19 02/21/19  [name], MD    Family History No family history on file.  Social History Social History   Tobacco Use  . Smoking status: Never Smoker  . Smokeless tobacco: Never Used  Substance Use Topics  . Alcohol use: Yes    Comment: 1 Glass of Scotch a Night  . Drug use: Never     Allergies   Clindamycin/lincomycin, Erythromycin, Lisinopril, Penicillins, and Sulfa antibiotics   Review of Systems Review of Systems  Constitutional:       Per HPI, otherwise negative  HENT:       Per HPI, otherwise negative  Respiratory:       Per HPI, otherwise negative  Cardiovascular:       Per HPI, otherwise negative  Gastrointestinal: Negative for vomiting.  Endocrine:       Negative aside from HPI  Genitourinary:       Neg aside from HPI  Musculoskeletal:       Per HPI, otherwise negative  Skin: Negative.   Neurological: Positive for dizziness, weakness and light-headedness. Negative for syncope.     Physical Exam Updated Vital Signs BP (!) 146/92 (BP Location: Left Arm)   Pulse 82   Temp 97.6 F (36.4 C) (Oral)   Resp 16   Ht 5' (1.524 m)   Wt 54.4 kg   SpO2 96%   BMI 23.44 kg/m   Physical Exam Vitals signs and nursing note reviewed.  Constitutional:      General: She is not in acute distress.    Appearance: She is well-developed.  HENT:     Head: Normocephalic and atraumatic.     Comments: No appreciable swelling of the tongue, nor visible oropharynx.  Eyes:     Conjunctiva/sclera: Conjunctivae normal.  Cardiovascular:     Rate and Rhythm: Normal rate and regular rhythm.  Pulmonary:     Effort: Pulmonary effort is normal. No respiratory distress.     Breath sounds: Normal breath sounds. No stridor.  Abdominal:     General: There is no distension.  Skin:    General: Skin is warm and dry.  Neurological:     Mental Status: She is alert and oriented to person, place, and time.     Cranial Nerves: No cranial nerve deficit.     Motor: Atrophy present. No tremor or abnormal muscle tone.     Comments: No asymmetric weakness, no facial asymmetry, speech is clear, brief.      ED Treatments / Results  Labs (all labs ordered are listed, but only abnormal results are displayed) Labs Reviewed  COMPREHENSIVE METABOLIC PANEL - Abnormal; Notable for the following components:      Result Value   Glucose, Bld 100 (*)    AST 14 (*)    GFR calc non Af Amer 59 (*)    All other components within normal limits  CBC WITH DIFFERENTIAL/PLATELET - Abnormal; Notable for the following components:   Abs Immature Granulocytes 0.10 (*)    All other components within normal limits  URINALYSIS, ROUTINE W REFLEX MICROSCOPIC - Abnormal; Notable for the following components:   Color, Urine STRAW (*)    Specific Gravity, Urine 1.003 (*)    Hgb urine dipstick SMALL (*)    All other components within normal limits  MAGNESIUM     Procedures Procedures (including critical care time)  Medications Ordered in ED Medications - No data to display   Initial Impression / Assessment and Plan / ED Course  I have reviewed the triage vital signs and the nursing notes.  Pertinent labs & imaging results that were available during my care of the patient were reviewed by me and considered in my medical decision making (see chart for details).    I reviewed the patient's chart including scan CT, CT chest from 2 days ago, and evaluation for angioedema last week.      9:46 PM 9:46 PM Patient in no distress, awake, alert, no new complaints. We had a lengthy conversation about her lack of history for medications until this recent diagnosis of hypertension. Now, repeat values, sitting and standing, both 145/90, roughly. She does have some head fullness, but notes that she has been dependent on Allegra for a long time for sinus congestion With reassuring CT scan of her head a few days ago, and x-rays, CT of her chest also reassuring, with reassuring values tonight, no evidence for endorgan effects, no indication for  admission, or additional imaging. With a lengthy conversation about her recent adjustments in her blood pressure medication regimen, there is some suspicion for her take some time to get adjusted to these given her otherwise reassuring findings Patient will call the office Monday to expedite her follow-up which is already scheduled for midweek. Patient discharged in stable condition after conversation on return precautions and follow-up instructions.  Patient amenable to close outpatient follow-up.  Final Clinical Impressions(s) / ED Diagnoses   Final diagnoses:  Hypertensive urgency      Carmin Muskrat, MD 03/02/19 2148

## 2019-03-02 NOTE — Discharge Instructions (Addendum)
As discussed, it is important that you follow-up as scheduled this week with your physician. There are some suspicion for about her taking time to accommodate the new blood pressure medication regimen. Return here for any concerning changes in your condition.

## 2019-03-04 ENCOUNTER — Other Ambulatory Visit: Payer: Self-pay

## 2019-03-04 ENCOUNTER — Telehealth: Payer: Self-pay | Admitting: Family Medicine

## 2019-03-04 ENCOUNTER — Telehealth (INDEPENDENT_AMBULATORY_CARE_PROVIDER_SITE_OTHER): Payer: Medicare Other | Admitting: Family Medicine

## 2019-03-04 ENCOUNTER — Encounter: Payer: Self-pay | Admitting: Family Medicine

## 2019-03-04 VITALS — BP 190/92 | HR 82

## 2019-03-04 DIAGNOSIS — T783XXD Angioneurotic edema, subsequent encounter: Secondary | ICD-10-CM

## 2019-03-04 DIAGNOSIS — I10 Essential (primary) hypertension: Secondary | ICD-10-CM

## 2019-03-04 DIAGNOSIS — R42 Dizziness and giddiness: Secondary | ICD-10-CM | POA: Diagnosis not present

## 2019-03-04 MED ORDER — AMLODIPINE BESYLATE 2.5 MG PO TABS: 2.5000 mg | ORAL_TABLET | Freq: Every day | ORAL | 0 refills | Status: DC

## 2019-03-04 NOTE — Progress Notes (Signed)
Telemedicine Encounter- SOAP NOTE Established Patient  This telephone encounter was conducted with the patient's (or proxy's) verbal consent via audio telecommunications: yes/no: Yes Patient was instructed to have this encounter in a suitably private space; and to only have persons present to whom they give permission to participate. In addition, patient identity was confirmed by use of name plus two identifiers (DOB and address).  I discussed the limitations, risks, security and privacy concerns of performing an evaluation and management service by telephone and the availability of in person appointments. I also discussed with the patient that there may be a patient responsible charge related to this service. The patient expressed understanding and agreed to proceed.  I spent a total of TIME; 0 MIN TO 60 MIN: 25 minutes talking with the patient or their proxy.  CC: hypertension  Subjective   Gina Pace is a 85 y.o. established patient. Telephone visit today for  HPI  She is taking amlodipine 2.5mg  in the morning and in the afternoon She had home health stop by and her bp was noted to be elevated She felt like her bp was elevated on her head She states that she had bp right arm 190/92 at 749 176/103 left arm She had a normal pulse  She reports that she has been getting her bp checked for 2.5 weeks now Yesterday her bp was  173/94 and right arm 159/83 Yesterday at 5pm 150/86  She states that the feels like she has been having pressure on the top of her head when she sits up When she lies down the symptoms resolve She was on lisinopril and developed angioedema and was treated with Beandryl and Prednisone She states that she feels like her bp meds have been adjusted up and down She states that she had high bp from the prednisone She felt terrible from that and took amlodipine 10mg   She states that that made her feel ill She continues to have some tongue swelling  She  used to drink a daily scotch on the rocks She has not been having that and now she has daily headaches She hates being still and keeps calling 911 because she feels funny on the medication.   Patient Active Problem List   Diagnosis Date Noted  . Elevated blood pressure reading without diagnosis of hypertension 02/14/2019  . Dizziness and giddiness 02/14/2019    Past Medical History:  Diagnosis Date  . Allergy   . Cataract   . Osteoporosis   . Tuberculosis   . Ulcer of gastric fundus, acute     Current Outpatient Medications  Medication Sig Dispense Refill  . fexofenadine (ALLEGRA) 180 MG tablet Take 180 mg by mouth daily.    Marland Kitchen amLODipine (NORVASC) 2.5 MG tablet Take 1 tablet (2.5 mg total) by mouth daily. 30 tablet 0   No current facility-administered medications for this visit.     Allergies  Allergen Reactions  . Clindamycin/Lincomycin   . Erythromycin   . Lisinopril     Angioedema   . Penicillins   . Sulfa Antibiotics     Social History   Socioeconomic History  . Marital status: Married    Spouse name: Not on file  . Number of children: Not on file  . Years of education: Not on file  . Highest education level: Not on file  Occupational History  . Not on file  Social Needs  . Financial resource strain: Not on file  . Food insecurity  Worry: Not on file    Inability: Not on file  . Transportation needs    Medical: Not on file    Non-medical: Not on file  Tobacco Use  . Smoking status: Never Smoker  . Smokeless tobacco: Never Used  Substance and Sexual Activity  . Alcohol use: Yes    Comment: 1 Glass of Scotch a Night  . Drug use: Never  . Sexual activity: Not Currently  Lifestyle  . Physical activity    Days per week: Not on file    Minutes per session: Not on file  . Stress: Not on file  Relationships  . Social Herbalist on phone: Not on file    Gets together: Not on file    Attends religious service: Not on file    Active  member of club or organization: Not on file    Attends meetings of clubs or organizations: Not on file    Relationship status: Not on file  . Intimate partner violence    Fear of current or ex partner: Not on file    Emotionally abused: Not on file    Physically abused: Not on file    Forced sexual activity: Not on file  Other Topics Concern  . Not on file  Social History Narrative  . Not on file    ROS Review of Systems  Constitutional: Negative for activity change, appetite change, chills and fever.  HENT: Negative for congestion, nosebleeds, trouble swallowing and voice change.   Respiratory: Negative for cough, shortness of breath and wheezing.   Gastrointestinal: Negative for diarrhea, nausea and vomiting.  Genitourinary: Negative for difficulty urinating, dysuria, flank pain and hematuria.  Musculoskeletal: Negative for back pain, joint swelling and neck pain.  Neurological: Negative for dizziness, speech difficulty, light-headedness and numbness.  See HPI. All other review of systems negative.   Objective   Vitals as reported by the patient: Today's Vitals   03/04/19 0906 03/04/19 0907  BP: (!) 176/103 (!) 190/92  Pulse: 82 82    There are no diagnoses linked to this encounter.  Instructed the patient and her son: Stop checking BP If you get a headache take tylenol and lay down She should stop checking her bp so many times a day She is developing anxiety about hypertension and getting her bp checked She can resume her scotch on the rocks but she should skip her dose if she feels dizzy BP goal is 150/90 based on her age Follow up for 2 weeks in person for an office visit to reassure the patient of her bp and how she is doing She has a follow up appointment with her Cardiologist on 03/07/2019.     I discussed the assessment and treatment plan with the patient. The patient was provided an opportunity to ask questions and all were answered. The patient agreed with  the plan and demonstrated an understanding of the instructions.   The patient was advised to call back or seek an in-person evaluation if the symptoms worsen or if the condition fails to improve as anticipated.  I provided 25 minutes of non-face-to-face time during this encounter.  Forrest Moron, MD  Primary Care at [facility]

## 2019-03-04 NOTE — Progress Notes (Signed)
Scheduled appt for July 13

## 2019-03-04 NOTE — Progress Notes (Signed)
Patient and son on the phone states this morning when she got up she was feeling fine but then she started to feel pressure in her entire head.  States her face had numbness and her mouth became very dry.  The home nurse took her BP and it was 176/103 in her left arm and 190/92 in her right arm.   She took her BP medication this morning (2.5 mg) and went to lay down. She states that after awhile laying down makes her feel better.   Her son states she has been to the ER x3 and was sent home each time. The hospital constantly changes her medication dosage up and down.    Patient denies Shortness of breath, N/V, denies edema in lower extremities, denies chest pain/discomfort. She has not notice in changes in her urine output and her intake of water is sufficient.   Denies any recent travel/exposure in past 14-30 days

## 2019-03-04 NOTE — Telephone Encounter (Signed)
Pt sent to pharmacy

## 2019-03-04 NOTE — Telephone Encounter (Signed)
Medication: amLODipine (NORVASC) 2.5 MG tablet   Patient requesting refill of this medication.    Pharmacy:  [id number] [address], [place] (Phone) [phone] (Fax)

## 2019-03-05 NOTE — Progress Notes (Signed)
I have not seen this patient before. Not certain who her main cardiologist. Is. Will see her on scheduled appointment. I cannot treat this until I see her.

## 2019-03-06 ENCOUNTER — Telehealth: Payer: Self-pay | Admitting: Family Medicine

## 2019-03-06 ENCOUNTER — Telehealth: Payer: Self-pay | Admitting: Adult Health

## 2019-03-06 NOTE — Telephone Encounter (Signed)
SPOKE TO PATIENT-  APPOINTMENT CHANGED TO VIRTUAL -  INSTRUCTION GIVEN

## 2019-03-06 NOTE — Telephone Encounter (Signed)
New message:    Patient is calling stating that she needs to change her appt to virtual app she can not travel right now. Please call patient.

## 2019-03-06 NOTE — Telephone Encounter (Signed)
This message needs to go to the doctor, nurse and scheduler, not to the refill pool. Thanks

## 2019-03-06 NOTE — Telephone Encounter (Signed)
Medication Refill - Medication: amLODipine (NORVASC) 2.5 MG tablet (Patient called and stated that pharmacy was unaware that dosage should be 2.5mg  twice a day and needs correct dosage sent over.)  Has the patient contacted their pharmacy?Yes (Agent: If no, request that the patient contact the pharmacy for the refill.) (Agent: If yes, when and what did the pharmacy advise?)Contact PCP  Preferred Pharmacy (with phone number or street name):  [id number] [address], [place] [phone] (Phone) [phone] (Fax)     Agent: Please be advised that RX refills may take up to 3 business days. We ask that you follow-up with your pharmacy.

## 2019-03-07 ENCOUNTER — Telehealth: Payer: Self-pay | Admitting: Cardiology

## 2019-03-07 ENCOUNTER — Telehealth (INDEPENDENT_AMBULATORY_CARE_PROVIDER_SITE_OTHER): Payer: Medicare Other | Admitting: Adult Health

## 2019-03-07 ENCOUNTER — Encounter: Payer: Self-pay | Admitting: Adult Health

## 2019-03-07 VITALS — BP 148/76 | HR 98 | Ht 60.0 in | Wt 120.0 lb

## 2019-03-07 DIAGNOSIS — I10 Essential (primary) hypertension: Secondary | ICD-10-CM | POA: Diagnosis not present

## 2019-03-07 NOTE — Patient Instructions (Addendum)
Medication Instructions:  Continue current medications  If you need a refill on your cardiac medications before your next appointment, please call your pharmacy.  Labwork: None Ordered   Testing/Procedures: Your physician has requested that you have an echocardiogram. Echocardiography is a painless test that uses sound waves to create images of your heart. It provides your doctor with information about the size and shape of your heart and how well your heart's chambers and valves are working. This procedure takes approximately one hour. There are no restrictions for this procedure.  Follow-Up: .  Keep appointment with Dr Stanford Breed on 07/21 @ 10:20 am  At [facility], you and your health needs are our priority.  As part of our continuing mission to provide you with exceptional heart care, we have created designated Provider Care Teams.  These Care Teams include your primary Cardiologist (physician) and Advanced Practice Providers (APPs -  Physician Assistants and Nurse Practitioners) who all work together to provide you with the care you need, when you need it.  Thank you for choosing [facility] at [facility]!!

## 2019-03-07 NOTE — Telephone Encounter (Signed)
Left message for patient to call and schedule Echo that is needed before her appointment on 03/26/19 with Dr. Stanford Breed

## 2019-03-07 NOTE — Telephone Encounter (Signed)
Prescription filled 03-04-2019

## 2019-03-07 NOTE — Progress Notes (Signed)
Virtual Visit via Video Note   This visit type was conducted due to national recommendations for restrictions regarding the COVID-19 Pandemic (e.g. social distancing) in an effort to limit this patient's exposure and mitigate transmission in our community.  Due to her co-morbid illnesses, this patient is at least at moderate risk for complications without adequate follow up.  This format is felt to be most appropriate for this patient at this time.  All issues noted in this document were discussed and addressed.  A limited physical exam was performed with this format.  Please refer to the patient's chart for her consent to telehealth for [facility].   Date:  03/07/2019   ID:  Gina Pace, DOB October 20, 1924, MRN [id number]  Patient Location: Home Provider Location: Office  PCP:  Rutherford Guys, MD  Cardiologist:  Hochrein  Electrophysiologist:  None   Evaluation Performed:  New Patient Evaluation  Chief Complaint:  Hypertension   History of Present Illness:    Gina Pace is a 85 y.o. female with we are following for hypertension.  She has never been seen by cardiology in the past.  The patient's daughter called our office complaining of elevated blood pressure requiring her to be checked by EMS, her blood pressure was 146/87, and 165/76 with the patient complains of her heart pounding and feeling very weak.  The patient is on amlodipine.  Due to elevated blood pressure they requested an appointment today.  It is noted that on 02/21/2019 the patient was seen in the emergency room for angioedema after starting lisinopril.  She was treated with Benadryl, famotidine, prednisone, and IV fluids.  It does not appear that the patient has been seen in our office in the past but was to be established with Dr. Percival Spanish.  She is a new patient to our practice and was to be seen by Dr.Crenshaw to be established.Her BP was not well controlled and as above, was seen in the ED. She has  since been seen by PCP and BP is much better controlled.  Past medical history, family history, surgical history and social are documented below.  She is currently on amlodipine 2.5 mg twice daily with blood pressure running in the 389H over 73S systolic.  Her primary care physician has recently started her on this and does not want to manipulate her medications for at least 2 weeks.  The patient is not to take her blood pressure as she is found to get anxious with blood pressure readings and therefore she will have blood pressure readings only completed in physician's office.  She has practice social distancing.  Her family is very attentive and helpful.  Her only complaint is of some fullness in her head after taking amlodipine.  No dizziness, no blurred vision, no severe headache, chest pain or dyspnea.  The patient does not  have symptoms concerning for COVID-19 infection (fever, chills, cough, or new shortness of breath).    Past Medical History:  Diagnosis Date   Allergy    Cataract    GI bleed    Hypertension 02/2019   Newly diagnosed    Osteoporosis    Tuberculosis    Ulcer of gastric fundus, acute    Past Surgical History:  Procedure Laterality Date   ABDOMINAL HYSTERECTOMY     APPENDECTOMY     Bladder     Bladder tack   EYE SURGERY     Rectum     Rectal tear repair  Current Meds  Medication Sig   amLODipine (NORVASC) 2.5 MG tablet Take 1 tablet (2.5 mg total) by mouth daily.   fexofenadine (ALLEGRA) 180 MG tablet Take 180 mg by mouth daily.     Allergies:   Clindamycin/lincomycin, Erythromycin, Lisinopril, Penicillins, and Sulfa antibiotics   Social History   Tobacco Use   Smoking status: Never Smoker   Smokeless tobacco: Never Used  Substance Use Topics   Alcohol use: Yes    Comment: 1 Glass of Scotch a Night   Drug use: Never     Family Hx: The patient's family history includes CAD in her sister; CAD (age of onset: 43) in her father;  CAD (age of onset: 46) in her mother; Congestive Heart Failure in her sister; Emphysema in her brother; Heart attack in her father and mother.  ROS:   Please see the history of present illness.    All other systems reviewed and are negative.   Prior CV studies:   The following studies were reviewed today: No cardiology studies were completed prior to this office visit  Labs/Other Tests and Data Reviewed:    EKG:  EKG dated 02/25/2019, sinus bradycardia with occasional PACs, rate of 64 bpm (completed in ER).  Recent Labs: 02/14/2019: TSH 3.380 03/02/2019: ALT 8; BUN 14; Creatinine, Ser 0.85; Hemoglobin 13.3; Magnesium 2.4; Platelets 249; Potassium 4.1; Sodium 138   Recent Lipid Panel No results found for: CHOL, TRIG, HDL, CHOLHDL, LDLCALC, LDLDIRECT  Wt Readings from Last 3 Encounters:  03/07/19 120 lb (54.4 kg)  03/02/19 120 lb (54.4 kg)  02/27/19 117 lb (53.1 kg)     Objective:    Vital Signs:  BP (!) 148/76    Pulse 98    Ht 5' (1.524 m)    Wt 120 lb (54.4 kg)    BMI 23.44 kg/m    VITAL SIGNS:  reviewed GEN:  no acute distress EYES:  sclerae anicteric, EOMI - Extraocular Movements Intact RESPIRATORY:  normal respiratory effort, symmetric expansion MUSCULOSKELETAL:  no obvious deformities. NEURO:  alert and oriented x 3, no obvious focal deficit PSYCH:  normal affect  ASSESSMENT & PLAN:    1.  Newly diagnosed hypertension: Was to be established with Dr. Stanford Breed but was seen in the emergency room on 02/25/2019, with elevated blood pressure and reaction to lisinopril requiring treatment with Benadryl, Solu-Medrol, and famotidine with IV fluids.  She was taken off the lisinopril.  She has since been seen by her primary care physician who has her on amlodipine 2.5 mg twice daily.  She seems to be tolerating this okay but does have some complaints of some fullness in her head after taking the medication which is beginning to subside.  I will not make any changes in her medication  regimen at this time.  I will order an echocardiogram to help [place] to manage medications and evaluate her LV function.  At her age not certain that we need to proceed with any ischemic testing as EKG was not indicative of abnormalities related to ischemia or prior MI.  She remains very active even though she is staying quarantined due to pandemic but prior to this she was very active socially did line dancing, visit often with friends exercise daily.  She had no symptoms related to any of her exertional activities.  She does have a strong family history of coronary artery disease but she is completely asymptomatic at this time.  Will defer to Dr. Stanford Breed for need to proceed with any  further cardiac testing after echocardiogram has been completed and more information is available concerning her response to amlodipine.  COVID-19 Education: The signs and symptoms of COVID-19 were discussed with the patient and how to seek care for testing (follow up with PCP or arrange E-visit).  The importance of social distancing was discussed today.  Time:   Today, I have spent 40 minutes with the patient with telehealth technology discussing the above problems.     Medication Adjustments/Labs and Tests Ordered: Current medicines are reviewed at length with the patient today.  Concerns regarding medicines are outlined above.   Tests Ordered: Orders Placed This Encounter  Procedures   ECHOCARDIOGRAM COMPLETE    Medication Changes: Echocardiogram  Disposition:  Follow up with Dr.Crenshaw in the office to be established with him.   Signed, Phill Myron. West Pugh, ANP, AACC  03/07/2019 11:35 AM    [facility]

## 2019-03-11 ENCOUNTER — Ambulatory Visit: Payer: Self-pay | Admitting: Family Medicine

## 2019-03-18 ENCOUNTER — Other Ambulatory Visit: Payer: Self-pay

## 2019-03-18 ENCOUNTER — Ambulatory Visit (INDEPENDENT_AMBULATORY_CARE_PROVIDER_SITE_OTHER): Payer: Medicare Other | Admitting: Family Medicine

## 2019-03-18 ENCOUNTER — Encounter: Payer: Self-pay | Admitting: Family Medicine

## 2019-03-18 VITALS — BP 170/76 | HR 99 | Temp 97.4°F | Resp 18 | Ht 60.0 in | Wt 116.8 lb

## 2019-03-18 DIAGNOSIS — I10 Essential (primary) hypertension: Secondary | ICD-10-CM

## 2019-03-18 MED ORDER — AMLODIPINE BESYLATE 5 MG PO TABS: 5.0000 mg | ORAL_TABLET | Freq: Two times a day (BID) | ORAL | 0 refills | Status: DC

## 2019-03-18 NOTE — Patient Instructions (Addendum)
   If you have lab work done today you will be contacted with your lab results within the next 2 weeks.  If you have not heard from us then please contact us. The fastest way to get your results is to register for My Chart.   IF you received an x-ray today, you will receive an invoice from [facility]. Please contact [facility] at [phone] with questions or concerns regarding your invoice.   IF you received labwork today, you will receive an invoice from [facility]. Please contact [facility] at [phone] with questions or concerns regarding your invoice.   Our billing staff will not be able to assist you with questions regarding bills from these companies.  You will be contacted with the lab results as soon as they are available. The fastest way to get your results is to activate your My Chart account. Instructions are located on the last page of this paperwork. If you have not heard from us regarding the results in 2 weeks, please contact this office.     Living With Anxiety  After being diagnosed with an anxiety disorder, you may be relieved to know why you have felt or behaved a certain way. It is natural to also feel overwhelmed about the treatment ahead and what it will mean for your life. With care and support, you can manage this condition and recover from it. How to cope with anxiety Dealing with stress Stress is your body's reaction to life changes and events, both good and bad. Stress can last just a few hours or it can be ongoing. Stress can play a major role in anxiety, so it is important to learn both how to cope with stress and how to think about it differently. Talk with your health care provider or a counselor to learn more about stress reduction. He or she may suggest some stress reduction techniques, such as:  Music therapy. This can include creating or listening to music that you enjoy and that inspires you.  Mindfulness-based meditation. This  involves being aware of your normal breaths, rather than trying to control your breathing. It can be done while sitting or walking.  Centering prayer. This is a kind of meditation that involves focusing on a word, phrase, or sacred image that is meaningful to you and that brings you peace.  Deep breathing. To do this, expand your stomach and inhale slowly through your nose. Hold your breath for 3-5 seconds. Then exhale slowly, allowing your stomach muscles to relax.  Self-talk. This is a skill where you identify thought patterns that lead to anxiety reactions and correct those thoughts.  Muscle relaxation. This involves tensing muscles then relaxing them. Choose a stress reduction technique that fits your lifestyle and personality. Stress reduction techniques take time and practice. Set aside 5-15 minutes a day to do them. Therapists can offer training in these techniques. The training may be covered by some insurance plans. Other things you can do to manage stress include:  Keeping a stress diary. This can help you learn what triggers your stress and ways to control your response.  Thinking about how you respond to certain situations. You may not be able to control everything, but you can control your reaction.  Making time for activities that help you relax, and not feeling guilty about spending your time in this way. Therapy combined with coping and stress-reduction skills provides the best chance for successful treatment. Medicines Medicines can help ease symptoms. Medicines for anxiety include:    Anti-anxiety drugs.  Antidepressants.  Beta-blockers. Medicines may be used as the main treatment for anxiety disorder, along with therapy, or if other treatments are not working. Medicines should be prescribed by a health care provider. Relationships Relationships can play a big part in helping you recover. Try to spend more time connecting with trusted friends and family members. Consider  going to couples counseling, taking family education classes, or going to family therapy. Therapy can help you and others better understand the condition. How to recognize changes in your condition Everyone has a different response to treatment for anxiety. Recovery from anxiety happens when symptoms decrease and stop interfering with your daily activities at home or work. This may mean that you will start to:  Have better concentration and focus.  Sleep better.  Be less irritable.  Have more energy.  Have improved memory. It is important to recognize when your condition is getting worse. Contact your health care provider if your symptoms interfere with home or work and you do not feel like your condition is improving. Where to find help and support: You can get help and support from these sources:  Self-help groups.  Online and community organizations.  A trusted spiritual leader.  Couples counseling.  Family education classes.  Family therapy. Follow these instructions at home:  Eat a healthy diet that includes plenty of vegetables, fruits, whole grains, low-fat dairy products, and lean protein. Do not eat a lot of foods that are high in solid fats, added sugars, or salt.  Exercise. Most adults should do the following: ? Exercise for at least 150 minutes each week. The exercise should increase your heart rate and make you sweat (moderate-intensity exercise). ? Strengthening exercises at least twice a week.  Cut down on caffeine, tobacco, alcohol, and other potentially harmful substances.  Get the right amount and quality of sleep. Most adults need 7-9 hours of sleep each night.  Make choices that simplify your life.  Take over-the-counter and prescription medicines only as told by your health care provider.  Avoid caffeine, alcohol, and certain over-the-counter cold medicines. These may make you feel worse. Ask your pharmacist which medicines to avoid.  Keep all  follow-up visits as told by your health care provider. This is important. Questions to ask your health care provider  Would I benefit from therapy?  How often should I follow up with a health care provider?  How long do I need to take medicine?  Are there any long-term side effects of my medicine?  Are there any alternatives to taking medicine? Contact a health care provider if:  You have a hard time staying focused or finishing daily tasks.  You spend many hours a day feeling worried about everyday life.  You become exhausted by worry.  You start to have headaches, feel tense, or have nausea.  You urinate more than normal.  You have diarrhea. Get help right away if:  You have a racing heart and shortness of breath.  You have thoughts of hurting yourself or others. If you ever feel like you may hurt yourself or others, or have thoughts about taking your own life, get help right away. You can go to your nearest emergency department or call:  Your local emergency services (911 in the [place]  A suicide crisis helpline, such as the National Suicide Prevention Lifeline at 1-800-273-8255. This is open 24-hours a day. Summary  Taking steps to deal with stress can help calm you.  Medicines cannot cure anxiety disorders,   but they can help ease symptoms.  Family, friends, and partners can play a big part in helping you recover from an anxiety disorder. This information is not intended to replace advice given to you by your health care provider. Make sure you discuss any questions you have with your health care provider. Document Released: 08/16/2016 Document Revised: 08/04/2017 Document Reviewed: 08/16/2016 Elsevier Patient Education  2020 Elsevier Inc.  

## 2019-03-18 NOTE — Progress Notes (Signed)
Referring-Gina M. Gina Pert, MD Reason for referral-hypertension  HPI: 85 year old female for evaluation of hypertension at request of Gina Pace. Gina Pert, MD.  Patient does have a history of angioedema with lisinopril.  Chest CT June 2020 showed dilated pulmonary artery and cardiomegaly. Laboratories June 2020 showed sodium 138, potassium 4.1, BUN 14 and creatinine 0.85.  Echocardiogram July 2020 showed normal LV function, mild LVH, mild diastolic dysfunction, moderate left atrial enlargement, moderate mitral regurgitation, moderate aortic insufficiency and moderate pulmonic insufficiency.  Patient denies dyspnea, chest pain, palpitations or syncope.  Current Outpatient Medications  Medication Sig Dispense Refill  . Acetaminophen (TYLENOL PO) Take by mouth.    Marland Kitchen amLODipine (NORVASC) 5 MG tablet Take 1 tablet (5 mg total) by mouth 2 (two) times a day. 60 tablet 0  . fexofenadine (ALLEGRA) 180 MG tablet Take 180 mg by mouth daily.     No current facility-administered medications for this visit.     Allergies  Allergen Reactions  . Clindamycin/Lincomycin   . Erythromycin   . Lisinopril     Angioedema   . Penicillins   . Sulfa Antibiotics      Past Medical History:  Diagnosis Date  . Allergy   . Cataract   . GI bleed   . Hypertension 02/2019   Newly diagnosed   . Osteoporosis   . Tuberculosis   . Ulcer of gastric fundus, acute     Past Surgical History:  Procedure Laterality Date  . ABDOMINAL HYSTERECTOMY    . APPENDECTOMY    . Bladder     Bladder tack  . EYE SURGERY    . Rectum     Rectal tear repair    Social History   Socioeconomic History  . Marital status: Widowed    Spouse name: Not on file  . Number of children: 3  . Years of education: Not on file  . Highest education level: Not on file  Occupational History  . Occupation: Clinical cytogeneticist    Comment: Retired   . 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 Financial resource  strain: Not on file  . Food insecurity    Worry: Not on file    Inability: Not on file  . Transportation needs    Medical: Not on file    Non-medical: Not on file  Tobacco Use  . Smoking status: Never Smoker  . Smokeless tobacco: Never Used  Substance and Sexual Activity  . Alcohol use: Yes    Comment: 1 Glass of Scotch a Night  . Drug use: Never  . Sexual activity: Not Currently  Lifestyle  . Physical activity    Days per week: Not on file    Minutes per session: Not on file  . Stress: Not on file  Relationships  . Social Herbalist on phone: Not on file    Gets together: Not on file    Attends religious service: Not on file    Active member of club or organization: Not on file    Attends meetings of clubs or organizations: Not on file    Relationship status: Not on file  . Intimate partner violence    Fear of current or ex partner: Not on file    Emotionally abused: Not on file    Physically abused: Not on file    Forced sexual activity: Not on file  Other Topics Concern  . Not on file  Social History  Narrative   Has 3 children 2 daughters and a son, who are very attentive.    Lives alone in a townhouse.    Able to perform all ADL's   Exercises daily   Belongs to social clubs   Does line dancing.     Family History  Problem Relation Age of Onset  . CAD Mother 16  . Heart attack Mother   . CAD Father 68  . Heart attack Father   . Congestive Heart Failure Sister   . CAD Sister        Hx of CABG  . Emphysema Brother     ROS: no fevers or chills, productive cough, hemoptysis, dysphasia, odynophagia, melena, hematochezia, dysuria, hematuria, rash, seizure activity, orthopnea, PND, pedal edema, claudication. Remaining systems are negative.  Physical Exam:   Blood pressure (!) 146/80, pulse 100, temperature (!) 97.2 F (36.2 C), height 5' (1.524 m), weight 114 lb (51.7 kg), SpO2 98 %.  General:  Well developed/well nourished in NAD Skin warm/dry  Patient not depressed No peripheral clubbing Back-normal HEENT-normal/normal eyelids Neck supple/normal carotid upstroke bilaterally; no bruits; no JVD; no thyromegaly chest - CTA/ normal expansion CV - RRR/normal S1 and S2; no rubs or gallops;  PMI nondisplaced, 1/6 systolic murmur apex. Abdomen -NT/ND, no HSM, no mass, + bowel sounds, no bruit 2+ femoral pulses, no bruits Ext-no edema, chords, 2+ DP Neuro-grossly nonfocal  ECG -February 21, 2019-sinus rhythm with nonspecific ST changes personally reviewed  A/P  1 hypertension-patient's blood pressure is borderline today.  Her amlodipine was increased to 5 mg twice daily 1 week ago.  I have asked her to check her blood pressure 3 times weekly and keep records.  We can add additional medications as needed.  2 moderate aortic and mitral regurgitation-we will plan follow-up echoes in the future but given age would be conservative if possible.  Kirk Ruths, MD

## 2019-03-18 NOTE — Progress Notes (Signed)
Established Patient Office Visit  Subjective:  Patient ID: Gina Pace, female    DOB: Sep 29, 1924  Age: 85 y.o. MRN: [id number]  CC:  Chief Complaint  Patient presents with  . Hypertension    2 week follow up on her BP.  states she may need a refill on Amlodipine    HPI [name] presents for   Pt reports that she could tell that her bp has been good but she can feel that her bp is high coming  She is taking amlodipine 2.5mg  in the morning and in the evening She states that she feels fine  No dizziness but noted that she had  She had headache and angioedema with lisinopril She reports that she gets headaches and it affects her mouth and the side of her face She reports that the allegra seems to calm down the swelling of the tongue and the lip.  But she is happy to be off the lisinopril She states that she is taking her meds and is afraid of any new meds She has an ECHO on 03/20/2019  Past Medical History:  Diagnosis Date  . Allergy   . Cataract   . GI bleed   . Hypertension 02/2019   Newly diagnosed   . Osteoporosis   . Tuberculosis   . Ulcer of gastric fundus, acute     Past Surgical History:  Procedure Laterality Date  . ABDOMINAL HYSTERECTOMY    . APPENDECTOMY    . Bladder     Bladder tack  . EYE SURGERY    . Rectum     Rectal tear repair    Family History  Problem Relation Age of Onset  . CAD Mother 35  . Heart attack Mother   . CAD Father 59  . Heart attack Father   . Congestive Heart Failure Sister   . CAD Sister        Hx of CABG  . Emphysema Brother     Social History   Socioeconomic History  . Marital status: Widowed    Spouse name: Not on file  . Number of children: 3  . Years of education: Not on file  . Highest education level: Not on file  Occupational History  . Occupation: Clinical cytogeneticist    Comment: Retired   . 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 Financial resource strain: Not on  file  . Food insecurity    Worry: Not on file    Inability: Not on file  . Transportation needs    Medical: Not on file    Non-medical: Not on file  Tobacco Use  . Smoking status: Never Smoker  . Smokeless tobacco: Never Used  Substance and Sexual Activity  . Alcohol use: Yes    Comment: 1 Glass of Scotch a Night  . Drug use: Never  . Sexual activity: Not Currently  Lifestyle  . Physical activity    Days per week: Not on file    Minutes per session: Not on file  . Stress: Not on file  Relationships  . Social Herbalist on phone: Not on file    Gets together: Not on file    Attends religious service: Not on file    Active member of club or organization: Not on file    Attends meetings of clubs or organizations: Not on file    Relationship status: Not on file  . Intimate  partner violence    Fear of current or ex partner: Not on file    Emotionally abused: Not on file    Physically abused: Not on file    Forced sexual activity: Not on file  Other Topics Concern  . Not on file  Social History Narrative   Has 3 children 2 daughters and a son, who are very attentive.    Lives alone in a townhouse.    Able to perform all ADL's   Exercises daily   Belongs to social clubs   Does line dancing.     Outpatient Medications Prior to Visit  Medication Sig Dispense Refill  . Acetaminophen (TYLENOL PO) Take by mouth.    . fexofenadine (ALLEGRA) 180 MG tablet Take 180 mg by mouth daily.    Marland Kitchen amLODipine (NORVASC) 2.5 MG tablet Take 1 tablet (2.5 mg total) by mouth daily. 30 tablet 0   No facility-administered medications prior to visit.     Allergies  Allergen Reactions  . Clindamycin/Lincomycin   . Erythromycin   . Lisinopril     Angioedema   . Penicillins   . Sulfa Antibiotics     ROS Review of Systems See hpi   Objective:    Physical Exam  BP (!) 170/76 (BP Location: Right Arm, Patient Position: Sitting, Cuff Size: Normal)   Pulse 99   Temp (!) 97.4  F (36.3 C) (Oral)   Resp 18   Ht 5' (1.524 m)   Wt 116 lb 12.8 oz (53 kg)   SpO2 96%   BMI 22.81 kg/m  Wt Readings from Last 3 Encounters:  03/26/19 114 lb (51.7 kg)  03/18/19 116 lb 12.8 oz (53 kg)  03/07/19 120 lb (54.4 kg)   Physical Exam  Constitutional: Oriented to person, place, and time. Appears well-developed and well-nourished.  HENT:  Head: Normocephalic and atraumatic.  Eyes: Conjunctivae and EOM are normal.  Cardiovascular: Normal rate, regular rhythm, normal heart sounds and intact distal pulses.  No murmur heard. Pulmonary/Chest: Effort normal and breath sounds normal. No stridor. No respiratory distress. Has no wheezes.  Neurological: Is alert and oriented to person, place, and time.  Skin: Skin is warm. Capillary refill takes less than 2 seconds.  Psychiatric: Has a normal mood and affect. Behavior is normal. Judgment and thought content normal.    Health Maintenance Due  Topic Date Due  . TETANUS/TDAP  02/01/1946  . DEXA SCAN  02/02/1992  . PNA vac Low Risk Adult (1 of 2 - PCV13) 02/02/1992    There are no preventive care reminders to display for this patient.  Lab Results  Component Value Date   TSH 3.380 02/14/2019   Lab Results  Component Value Date   WBC 6.9 03/02/2019   HGB 13.3 03/02/2019   HCT 42.9 03/02/2019   MCV 94.9 03/02/2019   PLT 249 03/02/2019   Lab Results  Component Value Date   NA 138 03/02/2019   K 4.1 03/02/2019   CO2 24 03/02/2019   GLUCOSE 100 (H) 03/02/2019   BUN 14 03/02/2019   CREATININE 0.85 03/02/2019   BILITOT 0.4 03/02/2019   ALKPHOS 65 03/02/2019   AST 14 (L) 03/02/2019   ALT 8 03/02/2019   PROT 6.5 03/02/2019   ALBUMIN 3.9 03/02/2019   CALCIUM 9.8 03/02/2019   ANIONGAP 8 03/02/2019   No results found for: CHOL No results found for: HDL No results found for: LDLCALC No results found for: TRIG No results found for: CHOLHDL No results  found for: HGBA1C    Assessment & Plan:   Problem List Items  Addressed This Visit    None    Visit Diagnoses    Essential hypertension    -  Primary   Relevant Medications   amLODipine (NORVASC) 5 MG tablet    adjusted medication to 5mg  of amlodpidine bid Discussed continue a healthy diet Decreasing how often she checks her bp at home  Meds ordered this encounter  Medications  . amLODipine (NORVASC) 5 MG tablet    Sig: Take 1 tablet (5 mg total) by mouth 2 (two) times a day.    Dispense:  60 tablet    Refill:  0    Follow-up: Return in about 4 weeks (around 04/15/2019) for with Dr. [name] as a televisit.    Forrest Moron, MD

## 2019-03-19 ENCOUNTER — Other Ambulatory Visit (HOSPITAL_COMMUNITY): Payer: Medicare Other

## 2019-03-20 ENCOUNTER — Ambulatory Visit (HOSPITAL_COMMUNITY): Payer: Medicare Other | Attending: Cardiology

## 2019-03-20 ENCOUNTER — Other Ambulatory Visit: Payer: Self-pay

## 2019-03-20 DIAGNOSIS — I10 Essential (primary) hypertension: Secondary | ICD-10-CM | POA: Diagnosis present

## 2019-03-26 ENCOUNTER — Encounter: Payer: Self-pay | Admitting: Cardiology

## 2019-03-26 ENCOUNTER — Other Ambulatory Visit: Payer: Self-pay

## 2019-03-26 ENCOUNTER — Ambulatory Visit (INDEPENDENT_AMBULATORY_CARE_PROVIDER_SITE_OTHER): Payer: Medicare Other | Admitting: Cardiology

## 2019-03-26 VITALS — BP 146/80 | HR 100 | Temp 97.2°F | Ht 60.0 in | Wt 114.0 lb

## 2019-03-26 DIAGNOSIS — I10 Essential (primary) hypertension: Secondary | ICD-10-CM

## 2019-03-26 DIAGNOSIS — I351 Nonrheumatic aortic (valve) insufficiency: Secondary | ICD-10-CM | POA: Diagnosis not present

## 2019-03-26 NOTE — Patient Instructions (Signed)

## 2019-04-15 ENCOUNTER — Other Ambulatory Visit: Payer: Self-pay | Admitting: Family Medicine

## 2019-04-15 DIAGNOSIS — I10 Essential (primary) hypertension: Secondary | ICD-10-CM

## 2019-04-17 ENCOUNTER — Telehealth: Payer: Self-pay | Admitting: Cardiovascular Disease

## 2019-04-17 NOTE — Telephone Encounter (Signed)
Echo ordered by K.Lawrence NP but more recent MD note does not suggest testing at this time.   Sent to MD/RN to advise if necessary

## 2019-04-17 NOTE — Telephone Encounter (Signed)
Echo ordered for hypertension and murmur Gina Pace

## 2019-04-17 NOTE — Telephone Encounter (Signed)
New Message    Patient calling with questions about Echo needs more information as to why it's needed so her insurance won't deny the request.  Referral has Dr. Court Joy name on it but patient's cardiologist is Dr. Stanford Breed.

## 2019-04-17 NOTE — Telephone Encounter (Signed)
Echo done 03/20/2019  Spoke with patient about [employer] issue/denial - denied 3D images of blood vessels of kidney (?)  This is for a order requested by Dr. Sherren Mocha from 8 days ago in [place] that I see nothing with Dr. Antionette Char name on it  Asked that she send a copy of this denial to our office for review Will route to our billing department

## 2019-04-19 ENCOUNTER — Other Ambulatory Visit: Payer: Self-pay

## 2019-04-19 ENCOUNTER — Encounter: Payer: Self-pay | Admitting: Family Medicine

## 2019-04-19 ENCOUNTER — Telehealth (INDEPENDENT_AMBULATORY_CARE_PROVIDER_SITE_OTHER): Payer: Medicare Other | Admitting: Family Medicine

## 2019-04-19 DIAGNOSIS — I10 Essential (primary) hypertension: Secondary | ICD-10-CM | POA: Diagnosis not present

## 2019-04-19 MED ORDER — AMLODIPINE BESYLATE 5 MG PO TABS: 5.0000 mg | ORAL_TABLET | Freq: Two times a day (BID) | ORAL | 3 refills | Status: DC

## 2019-04-19 NOTE — Patient Instructions (Signed)
° ° ° °  If you have lab work done today you will be contacted with your lab results within the next 2 weeks.  If you have not heard from us then please contact us. The fastest way to get your results is to register for My Chart. ° ° °IF you received an x-ray today, you will receive an invoice from [facility]. Please contact [facility] at [phone] with questions or concerns regarding your invoice.  ° °IF you received labwork today, you will receive an invoice from [facility]. Please contact [facility] at [phone] with questions or concerns regarding your invoice.  ° °Our billing staff will not be able to assist you with questions regarding bills from these companies. ° °You will be contacted with the lab results as soon as they are available. The fastest way to get your results is to activate your My Chart account. Instructions are located on the last page of this paperwork. If you have not heard from us regarding the results in 2 weeks, please contact this office. °  ° ° ° °

## 2019-04-19 NOTE — Progress Notes (Signed)
CC: 4 week recheck medical conditions.  Pt c/o head pressure after taking the amlodipine every so often but not every day.  No travel outside [place] or [place] in the past 3 weeks.  No med refills needed.

## 2019-04-19 NOTE — Progress Notes (Signed)
Telemedicine Encounter- SOAP NOTE Established Patient  This telephone encounter was conducted with the patient's (or proxy's) verbal consent via audio telecommunications: yes/no: Yes Patient was instructed to have this encounter in a suitably private space; and to only have persons present to whom they give permission to participate. In addition, patient identity was confirmed by use of name plus two identifiers (DOB and address).  I discussed the limitations, risks, security and privacy concerns of performing an evaluation and management service by telephone and the availability of in person appointments. I also discussed with the patient that there may be a patient responsible charge related to this service. The patient expressed understanding and agreed to proceed.  I spent a total of TIME; 0 MIN TO 60 MIN: 15 minutes talking with the patient or their proxy.  CC: hypertension    Subjective   Gina Pace is a 85 y.o. established patient. Telephone visit today for  HPI  Hypertension: Patient here for follow-up of elevated blood pressure. She is not exercising and is adherent to low salt diet.   BP Readings from Last 3 Encounters:  04/19/19 (!) 146/70  03/26/19 (!) 146/80  03/18/19 (!) 170/76   She is taking amlodipine 5mg  She took her bp med this morning and she notices that this morning she had a headache on the top of her head She states that she took 10mg  of amlodipine and she reports that she    Patient Active Problem List   Diagnosis Date Noted  . Elevated blood pressure reading without diagnosis of hypertension 02/14/2019  . Dizziness and giddiness 02/14/2019    Past Medical History:  Diagnosis Date  . Allergy   . Cataract   . GI bleed   . Hypertension 02/2019   Newly diagnosed   . Osteoporosis   . Tuberculosis   . Ulcer of gastric fundus, acute     Current Outpatient Medications  Medication Sig Dispense Refill  . Acetaminophen (TYLENOL PO) Take by  mouth.    Marland Kitchen amLODipine (NORVASC) 5 MG tablet Take 1 tablet (5 mg total) by mouth 2 (two) times daily. 60 tablet 3  . fexofenadine (ALLEGRA) 180 MG tablet Take 180 mg by mouth daily.     No current facility-administered medications for this visit.     Allergies  Allergen Reactions  . Chocolate Anaphylaxis    Migraines   . Prednisone Hypertension  . Shellfish Allergy   . Clindamycin/Lincomycin   . Erythromycin   . Lisinopril     Angioedema   . Penicillins   . Sulfa Antibiotics     Social History   Socioeconomic History  . Marital status: Widowed    Spouse name: Not on file  . Number of children: 3  . Years of education: Not on file  . Highest education level: Not on file  Occupational History  . Occupation: Clinical cytogeneticist    Comment: Retired   . 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 Financial resource strain: Not on file  . Food insecurity    Worry: Not on file    Inability: Not on file  . Transportation needs    Medical: Not on file    Non-medical: Not on file  Tobacco Use  . Smoking status: Never Smoker  . Smokeless tobacco: Never Used  Substance and Sexual Activity  . Alcohol use: Yes    Comment: 1 Glass of Scotch a Night  . Drug use:  Never  . Sexual activity: Not Currently  Lifestyle  . Physical activity    Days per week: Not on file    Minutes per session: Not on file  . Stress: Not on file  Relationships  . Social Herbalist on phone: Not on file    Gets together: Not on file    Attends religious service: Not on file    Active member of club or organization: Not on file    Attends meetings of clubs or organizations: Not on file    Relationship status: Not on file  . Intimate partner violence    Fear of current or ex partner: Not on file    Emotionally abused: Not on file    Physically abused: Not on file    Forced sexual activity: Not on file  Other Topics Concern  . Not on file  Social History  Narrative   Has 3 children 2 daughters and a son, who are very attentive.    Lives alone in a townhouse.    Able to perform all ADL's   Exercises daily   Belongs to social clubs   Does line dancing.     ROS  Review of Systems  Constitutional: Negative for activity change, appetite change, chills and fever.  HENT: Negative for congestion, nosebleeds, trouble swallowing and voice change.   Respiratory: Negative for cough, shortness of breath and wheezing.   Gastrointestinal: Negative for diarrhea, nausea and vomiting.  Genitourinary: Negative for difficulty urinating, dysuria, flank pain and hematuria.  Musculoskeletal: Negative for back pain, joint swelling and neck pain.  Neurological: Negative for dizziness, speech difficulty, light-headedness and numbness.  See HPI. All other review of systems negative.   Objective   Vitals as reported by the patient: Today's Vitals   04/19/19 1011  BP: (!) 146/70  Weight: 116 lb (52.6 kg)   Normal pulmonary effort  Diagnoses and all orders for this visit:  Essential hypertension -     amLODipine (NORVASC) 5 MG tablet; Take 1 tablet (5 mg total) by mouth 2 (two) times daily.  continue taking amlodipine BID Continue checking blood pressure three times a week as recommended by Cardiology Continue DASH diet Prn allegra   I discussed the assessment and treatment plan with the patient. The patient was provided an opportunity to ask questions and all were answered. The patient agreed with the plan and demonstrated an understanding of the instructions.   The patient was advised to call back or seek an in-person evaluation if the symptoms worsen or if the condition fails to improve as anticipated.  I provided 15 minutes of non-face-to-face time during this encounter.  Forrest Moron, MD  Primary Care at [facility]

## 2019-05-14 ENCOUNTER — Other Ambulatory Visit: Payer: Self-pay | Admitting: Family Medicine

## 2019-05-14 DIAGNOSIS — I10 Essential (primary) hypertension: Secondary | ICD-10-CM

## 2019-05-16 ENCOUNTER — Other Ambulatory Visit: Payer: Self-pay | Admitting: Family Medicine

## 2019-05-16 DIAGNOSIS — I10 Essential (primary) hypertension: Secondary | ICD-10-CM

## 2019-05-16 NOTE — Telephone Encounter (Signed)
Requested medication (s) are due for refill today: yes  Requested medication (s) are on the active medication list: yes  Last refill: 03/06/2019  Future visit scheduled: no  Notes to clinic:  The original prescription was discontinued on 03/18/2019 by Forrest Moron, MD. Renewing this prescription may not be appropriate   Requested Prescriptions  Pending Prescriptions Disp Refills   amLODipine (NORVASC) 2.5 MG tablet [Pharmacy Med Name: AMLODIPINE BESYLATE 2.5 MG TAB] 30 tablet 0    Sig: TAKE 1 TABLET BY MOUTH EVERY DAY     Cardiovascular:  Calcium Channel Blockers Failed - 05/16/2019 12:49 PM      Failed - Last BP in normal range    BP Readings from Last 1 Encounters:  04/19/19 (!) 146/70         Passed - Valid encounter within last 6 months    Recent Outpatient Visits          3 weeks ago Essential hypertension   Primary Care at [facility], Arlie Solomons, MD   1 month ago Essential hypertension   Primary Care at [facility], Arlie Solomons, MD   2 months ago    Primary Care at [facility], Arlie Solomons, MD   2 months ago Essential hypertension   Primary [facility] at [facility], Lilia Argue, MD   3 months ago Elevated blood pressure reading without diagnosis of hypertension   Primary Care at [facility], Rex Kras, MD

## 2019-05-26 NOTE — Progress Notes (Signed)
Problem List Items Addressed This Visit      Other   Dizziness and giddiness - improving Likely due to withdrawal from alcohol  No evidence of DTs    Other Visit Diagnoses    Essential hypertension    -  Provided reassurance, discussed meds    Angioedema, subsequent encounter    - resolved

## 2019-05-31 ENCOUNTER — Other Ambulatory Visit: Payer: Self-pay

## 2019-05-31 ENCOUNTER — Ambulatory Visit (INDEPENDENT_AMBULATORY_CARE_PROVIDER_SITE_OTHER): Payer: Medicare Other | Admitting: Family Medicine

## 2019-05-31 ENCOUNTER — Encounter: Payer: Self-pay | Admitting: Family Medicine

## 2019-05-31 VITALS — BP 160/70 | HR 87 | Temp 97.8°F | Ht 60.0 in | Wt 117.0 lb

## 2019-05-31 DIAGNOSIS — N3090 Cystitis, unspecified without hematuria: Secondary | ICD-10-CM

## 2019-05-31 DIAGNOSIS — R32 Unspecified urinary incontinence: Secondary | ICD-10-CM

## 2019-05-31 LAB — POC MICROSCOPIC URINALYSIS (UMFC): Mucus: ABSENT

## 2019-05-31 LAB — POCT URINALYSIS DIP (MANUAL ENTRY)
Bilirubin, UA: NEGATIVE
Glucose, UA: NEGATIVE mg/dL
Ketones, POC UA: NEGATIVE mg/dL
Nitrite, UA: POSITIVE — AB
Protein Ur, POC: NEGATIVE mg/dL
Spec Grav, UA: 1.015 (ref 1.010–1.025)
Urobilinogen, UA: 0.2 E.U./dL
pH, UA: 5.5 (ref 5.0–8.0)

## 2019-05-31 MED ORDER — CIPROFLOXACIN HCL 250 MG PO TABS: 250.0000 mg | ORAL_TABLET | Freq: Two times a day (BID) | ORAL | 0 refills | Status: DC

## 2019-05-31 NOTE — Patient Instructions (Addendum)
   You have a urinary tract infection and need to be treated with an antibiotic.  I understand that you have successfully used ciprofloxacin in the past, the last time 2 or 3 years ago.  This medicine does carry warnings with it for the elderly, but due to the fact that you are allergic to so many of the other antibiotics and have tolerated this I am going ahead and using it in you.  However I want you to drink plenty of fluids along with it.  If you develop any adverse side effects such as lightheadedness or confusion or muscle weakness or soreness you should discontinue it immediately and get back to [place].  Ciprofloxacin 250 mg 1 twice daily for 3 days  In 1 week you should get a repeat urinalysis to make sure this has cleared completely because it is been going on for a long time.  You do not need to see the physician, but come to the office and tell them you need to get your labs checked.  If you have lab work done today you will be contacted with your lab results within the next 2 weeks.  If you have not heard from [place] then please contact us. The fastest way to get your results is to register for My Chart.   IF you received an x-ray today, you will receive an invoice from [facility]. Please contact [facility] at [phone] with questions or concerns regarding your invoice.   IF you received labwork today, you will receive an invoice from [facility]. Please contact [facility] at [phone] with questions or concerns regarding your invoice.   Our billing staff will not be able to assist you with questions regarding bills from these companies.  You will be contacted with the lab results as soon as they are available. The fastest way to get your results is to activate your My Chart account. Instructions are located on the last page of this paperwork. If you have not heard from [place] regarding the results in 2 weeks, please contact this office.

## 2019-05-31 NOTE — Progress Notes (Signed)
Patient ID: Gina Pace, female    DOB: December 23, 1924  Age: 85 y.o. MRN: [id number]  Chief Complaint  Patient presents with  . order in urine    1.5 weeks     Subjective:   Patient has been noticing a bad odor to her urine for the past several weeks.  She noticed no blood.  She has not had a lot of symptoms.  She has a little frequency may be.  No fever or chills.  No flank pain.  She has had urinary tract infections in the past.  Several years ago she was treated with ciprofloxacin for this.  She says that is what she is always treated with, that she tolerates it well.  She is allergic to a number of other antibiotics.  We discussed the fact that this now carries a black box warning, but she has tolerated it well and cannot take most of the other meds so we will treat her accordingly.  Current allergies, medications, problem list, past/family and social histories reviewed.  Objective:  BP (!) 160/70 (BP Location: Left Arm, Patient Position: Sitting, Cuff Size: Normal)   Pulse 87   Temp 97.8 F (36.6 C) (Oral)   Ht 5' (1.524 m)   Wt 117 lb (53.1 kg)   SpO2 97%   BMI 22.85 kg/m   No major acute distress.  No CVA tenderness.  Abdomen soft without mass or tenderness.  Assessment & Plan:   Assessment: 1. Urinary incontinence, unspecified type   2. Cystitis       Plan: See instructions.  Orders Placed This Encounter  Procedures  . Urine Culture  . POCT urinalysis dipstick  . POCT Microscopic Urinalysis ([facility])    Meds ordered this encounter  Medications  . ciprofloxacin (CIPRO) 250 MG tablet    Sig: Take 1 tablet (250 mg total) by mouth 2 (two) times daily.    Dispense:  6 tablet    Refill:  0         Patient Instructions     You have a urinary tract infection and need to be treated with an antibiotic.  I understand that you have successfully used ciprofloxacin in the past, the last time 2 or 3 years ago.  This medicine does carry warnings with it for the  elderly, but due to the fact that you are allergic to so many of the other antibiotics and have tolerated this I am going ahead and using it in you.  However I want you to drink plenty of fluids along with it.  If you develop any adverse side effects such as lightheadedness or confusion or muscle weakness or soreness you should discontinue it immediately and get back to [place].  Ciprofloxacin 250 mg 1 twice daily for 3 days  In 1 week you should get a repeat urinalysis to make sure this has cleared completely because it is been going on for a long time.  You do not need to see the physician, but come to the office and tell them you need to get your labs checked.  If you have lab work done today you will be contacted with your lab results within the next 2 weeks.  If you have not heard from [place] then please contact us. The fastest way to get your results is to register for My Chart.   IF you received an x-ray today, you will receive an invoice from [facility]. Please contact [facility] at [phone] with questions or concerns  regarding your invoice.   IF you received labwork today, you will receive an invoice from [facility]. Please contact [facility] at [phone] with questions or concerns regarding your invoice.   Our billing staff will not be able to assist you with questions regarding bills from these companies.  You will be contacted with the lab results as soon as they are available. The fastest way to get your results is to activate your My Chart account. Instructions are located on the last page of this paperwork. If you have not heard from [place] regarding the results in 2 weeks, please contact this office.         Return if symptoms worsen or fail to improve.   Ruben Reason, MD 05/31/2019

## 2019-06-02 LAB — URINE CULTURE

## 2019-06-07 ENCOUNTER — Other Ambulatory Visit: Payer: Self-pay

## 2019-06-07 ENCOUNTER — Ambulatory Visit: Payer: Medicare Other

## 2019-06-07 DIAGNOSIS — N3090 Cystitis, unspecified without hematuria: Secondary | ICD-10-CM

## 2019-06-07 LAB — POCT URINALYSIS DIP (MANUAL ENTRY)
Bilirubin, UA: NEGATIVE
Glucose, UA: NEGATIVE mg/dL
Ketones, POC UA: NEGATIVE mg/dL
Leukocytes, UA: NEGATIVE
Nitrite, UA: NEGATIVE
Protein Ur, POC: NEGATIVE mg/dL
Spec Grav, UA: 1.01 (ref 1.010–1.025)
Urobilinogen, UA: 0.2 E.U./dL
pH, UA: 6 (ref 5.0–8.0)

## 2019-06-07 LAB — POC MICROSCOPIC URINALYSIS (UMFC): Mucus: ABSENT

## 2019-06-11 ENCOUNTER — Other Ambulatory Visit: Payer: Self-pay | Admitting: Family Medicine

## 2019-06-11 DIAGNOSIS — N3090 Cystitis, unspecified without hematuria: Secondary | ICD-10-CM

## 2019-06-11 DIAGNOSIS — I10 Essential (primary) hypertension: Secondary | ICD-10-CM

## 2019-06-11 NOTE — Telephone Encounter (Signed)
Requested medication (s) are due for refill today: no  Requested medication (s) are on the active medication list: no  Last refill:  03/06/2019  Future visit scheduled: no  Notes to clinic:  Medication was discontinued    Requested Prescriptions  Pending Prescriptions Disp Refills   amLODipine (NORVASC) 2.5 MG tablet [Pharmacy Med Name: AMLODIPINE BESYLATE 2.5 MG TAB] 30 tablet 0    Sig: TAKE 1 TABLET BY MOUTH EVERY DAY     Cardiovascular:  Calcium Channel Blockers Failed - 06/11/2019 10:27 AM      Failed - Last BP in normal range    BP Readings from Last 1 Encounters:  05/31/19 (!) 160/70         Passed - Valid encounter within last 6 months    Recent Outpatient Visits          1 week ago Urinary incontinence, unspecified type   Primary Care at [facility], Fenton Malling, MD   1 month ago Essential hypertension   Primary Care at [facility], Arlie Solomons, MD   2 months ago Essential hypertension   Primary Care at [place], MD   3 months ago Essential hypertension   Primary Care at [place], MD   3 months ago Essential hypertension   Primary Care at [facility], Lilia Argue, MD

## 2019-06-11 NOTE — Telephone Encounter (Signed)
Requested medication (s) are due for refill today: yes  Requested medication (s) are on the active medication list: yes  Last refill:  05/31/2019  Future visit scheduled: no  Notes to clinic:  Review for refill   Requested Prescriptions  Pending Prescriptions Disp Refills   ciprofloxacin (CIPRO) 250 MG tablet [Pharmacy Med Name: CIPROFLOXACIN HCL 250 MG TAB] 6 tablet 0    Sig: TAKE 1 TABLET BY MOUTH TWICE A DAY     Off-Protocol Failed - 06/11/2019 10:36 AM      Failed - Medication not assigned to a protocol, review manually.      Passed - Valid encounter within last 12 months    Recent Outpatient Visits          1 week ago Urinary incontinence, unspecified type   Primary Care at [facility], Fenton Malling, MD   1 month ago Essential hypertension   Primary Care at [facility], Arlie Solomons, MD   2 months ago Essential hypertension   Primary Care at [facility], Arlie Solomons, MD   3 months ago Essential hypertension   Primary Care at [facility], Arlie Solomons, MD   3 months ago Essential hypertension   [facility] at [facility], Lilia Argue, MD

## 2019-06-11 NOTE — Telephone Encounter (Signed)
Note states discontinues medication, is there an alternative. I did not see anything in notes

## 2019-06-14 NOTE — Telephone Encounter (Signed)
Please let the patient know that I sent in 90 days supply of the amlodipine in August.

## 2019-08-21 ENCOUNTER — Other Ambulatory Visit: Payer: Self-pay | Admitting: Family Medicine

## 2019-08-21 DIAGNOSIS — I10 Essential (primary) hypertension: Secondary | ICD-10-CM

## 2019-09-26 ENCOUNTER — Other Ambulatory Visit: Payer: Self-pay

## 2019-09-26 ENCOUNTER — Ambulatory Visit (INDEPENDENT_AMBULATORY_CARE_PROVIDER_SITE_OTHER): Payer: Medicare Other | Admitting: Family Medicine

## 2019-09-26 VITALS — BP 132/88 | HR 84 | Temp 98.0°F | Ht 60.0 in | Wt 120.0 lb

## 2019-09-26 DIAGNOSIS — R32 Unspecified urinary incontinence: Secondary | ICD-10-CM | POA: Diagnosis not present

## 2019-09-26 DIAGNOSIS — I10 Essential (primary) hypertension: Secondary | ICD-10-CM | POA: Diagnosis not present

## 2019-09-26 DIAGNOSIS — R0981 Nasal congestion: Secondary | ICD-10-CM

## 2019-09-26 LAB — POCT URINALYSIS DIP (MANUAL ENTRY)
Bilirubin, UA: NEGATIVE
Blood, UA: NEGATIVE
Glucose, UA: NEGATIVE mg/dL
Ketones, POC UA: NEGATIVE mg/dL
Leukocytes, UA: NEGATIVE
Nitrite, UA: NEGATIVE
Protein Ur, POC: NEGATIVE mg/dL
Spec Grav, UA: 1.015 (ref 1.010–1.025)
Urobilinogen, UA: NEGATIVE E.U./dL — AB
pH, UA: 6.5 (ref 5.0–8.0)

## 2019-09-26 MED ORDER — AMLODIPINE BESYLATE 2.5 MG PO TABS: 2.5000 mg | ORAL_TABLET | Freq: Two times a day (BID) | ORAL | 3 refills | Status: DC

## 2019-09-26 MED ORDER — FLUTICASONE PROPIONATE 50 MCG/ACT NA SUSP: 1.0000 | Freq: Every day | NASAL | 1 refills | Status: DC

## 2019-09-26 NOTE — Patient Instructions (Addendum)
See info below on how to check BP at home. Amlodipine 2.5mg  full pill twice per day for now. Keep a record of your blood pressures outside of the office for follow up virtual visit in the next 2 weeks  I do not see any concerning amount of face swelling today.  Okay to continue the Allegra, try 1 spray of Flonase in each nostril once per day to see if that will also help with sinus congestion and allergies.  Can stop that medicine if you have new side effects.  Follow-up in 2 weeks, sooner or to the emergency room or urgent care if any acute worsening including worsening swelling in face or other areas.    Urinary Incontinence  Urinary incontinence refers to a condition in which a person is unable to control where and when to pass urine. A person with this condition will urinate when he or she does not mean to (involuntarily). What are the causes? This condition may be caused by:  Medicines.  Infections.  Constipation.  Overactive bladder muscles.  Weak bladder muscles.  Weak pelvic floor muscles. These muscles provide support for the bladder, intestine, and, in women, the uterus.  Enlarged prostate in men. The prostate is a gland near the bladder. When it gets too big, it can pinch the urethra. With the urethra blocked, the bladder can weaken and lose the ability to empty properly.  Surgery.  Emotional factors, such as anxiety, stress, or post-traumatic stress disorder (PTSD).  Pelvic organ prolapse. This happens in women when organs shift out of place and into the vagina. This shift can prevent the bladder and urethra from working properly. What increases the risk? The following factors may make you more likely to develop this condition:  Older age.  Obesity and physical inactivity.  Pregnancy and childbirth.  Menopause.  Diseases that affect the nerves or spinal cord (neurological diseases).  Long-term (chronic) coughing. This can increase pressure on the bladder and  pelvic floor muscles. What are the signs or symptoms? Symptoms may vary depending on the type of urinary incontinence you have. They include:  A sudden urge to urinate, but passing urine involuntarily before you can get to a bathroom (urge incontinence).  Suddenly passing urine with any activity that forces urine to pass, such as coughing, laughing, exercise, or sneezing (stress incontinence).  Needing to urinate often, but urinating only a small amount, or constantly dribbling urine (overflow incontinence).  Urinating because you cannot get to the bathroom in time due to a physical disability, such as arthritis or injury, or communication and thinking problems, such as Alzheimer disease (functional incontinence). How is this diagnosed? This condition may be diagnosed based on:  Your medical history.  A physical exam.  Tests, such as: ? Urine tests. ? X-rays of your kidney and bladder. ? Ultrasound. ? CT scan. ? Cystoscopy. In this procedure, a health care provider inserts a tube with a light and camera (cystoscope) through the urethra and into the bladder in order to check for problems. ? Urodynamic testing. These tests assess how well the bladder, urethra, and sphincter can store and release urine. There are different types of urodynamic tests, and they vary depending on what the test is measuring. To help diagnose your condition, your health care provider may recommend that you keep a log of when you urinate and how much you urinate. How is this treated? Treatment for this condition depends on the type of incontinence that you have and its cause. Treatment may  include:  Lifestyle changes, such as: ? Quitting smoking. ? Maintaining a healthy weight. ? Staying active. Try to get 150 minutes of moderate-intensity exercise every week. Ask your health care provider which activities are safe for you. ? Eating a healthy diet.  Avoid high-fat foods, like fried foods.  Avoid refined  carbohydrates like white bread and white rice.  Limit how much alcohol and caffeine you drink.  Increase your fiber intake. Foods such as fresh fruits, vegetables, beans, and whole grains are healthy sources of fiber.  Pelvic floor muscle exercises.  Bladder training, such as lengthening the amount of time between bathroom breaks, or using the bathroom at regular intervals.  Using techniques to suppress bladder urges. This can include distraction techniques or controlled breathing exercises.  Medicines to relax the bladder muscles and prevent bladder spasms.  Medicines to help slow or prevent the growth of a man's prostate.  Botox injections. These can help relax the bladder muscles.  Using pulses of electricity to help change bladder reflexes (electrical nerve stimulation).  For women, using a medical device to prevent urine leaks. This is a small, tampon-like, disposable device that is inserted into the urethra.  Injecting collagen or carbon beads (bulking agents) into the urinary sphincter. These can help thicken tissue and close the bladder opening.  Surgery. Follow these instructions at home: Lifestyle  Limit alcohol and caffeine. These can fill your bladder quickly and irritate it.  Keep yourself clean to help prevent odors and skin damage. Ask your doctor about special skin creams and cleansers that can protect the skin from urine.  Consider wearing pads or adult diapers. Make sure to change them regularly, and always change them right after experiencing incontinence. General instructions  Take over-the-counter and prescription medicines only as told by your health care provider.  Use the bathroom about every 3-4 hours, even if you do not feel the need to urinate. Try to empty your bladder completely every time. After urinating, wait a minute. Then try to urinate again.  Make sure you are in a relaxed position while urinating.  If your incontinence is caused by nerve  problems, keep a log of the medicines you take and the times you go to the bathroom.  Keep all follow-up visits as told by your health care provider. This is important. Contact a health care provider if:  You have pain that gets worse.  Your incontinence gets worse. Get help right away if:  You have a fever or chills.  You are unable to urinate.  You have redness in your groin area or down your legs. Summary  Urinary incontinence refers to a condition in which a person is unable to control where and when to pass urine.  This condition may be caused by medicines, infection, weak bladder muscles, weak pelvic floor muscles, enlargement of the prostate (in men), or surgery.  The following factors increase your risk for developing this condition: older age, obesity, pregnancy and childbirth, menopause, neurological diseases, and chronic coughing.  There are several types of urinary incontinence. They include urge incontinence, stress incontinence, overflow incontinence, and functional incontinence.  This condition is usually treated first with lifestyle and behavioral changes, such as quitting smoking, eating a healthier diet, and doing regular pelvic floor exercises. Other treatment options include medicines, bulking agents, medical devices, electrical nerve stimulation, or surgery. This information is not intended to replace advice given to you by your health care provider. Make sure you discuss any questions you have with your  health care provider. Document Revised: 09/01/2017 Document Reviewed: 12/01/2016 Elsevier Patient Education  [place] [employer].    How to Take Your Blood Pressure You can take your blood pressure at home with a machine. You may need to check your blood pressure at home:  To check if you have high blood pressure (hypertension).  To check your blood pressure over time.  To make sure your blood pressure medicine is working. Supplies needed: You will need a  blood pressure machine, or monitor. You can buy one at a drugstore or online. When choosing one:  Choose one with an arm cuff.  Choose one that wraps around your upper arm. Only one finger should fit between your arm and the cuff.  Do not choose one that measures your blood pressure from your wrist or finger. Your doctor can suggest a monitor. How to prepare Avoid these things for 30 minutes before checking your blood pressure:  Drinking caffeine.  Drinking alcohol.  Eating.  Smoking.  Exercising. Five minutes before checking your blood pressure:  Pee.  Sit in a dining chair. Avoid sitting in a soft couch or armchair.  Be quiet. Do not talk. How to take your blood pressure Follow the instructions that came with your machine. If you have a digital blood pressure monitor, these may be the instructions: 1. Sit up straight. 2. Place your feet on the floor. Do not cross your ankles or legs. 3. Rest your left arm at the level of your heart. You may rest it on a table, desk, or chair. 4. Pull up your shirt sleeve. 5. Wrap the blood pressure cuff around the upper part of your left arm. The cuff should be 1 inch (2.5 cm) above your elbow. It is best to wrap the cuff around bare skin. 6. Fit the cuff snugly around your arm. You should be able to place only one finger between the cuff and your arm. 7. Put the cord inside the groove of your elbow. 8. Press the power button. 9. Sit quietly while the cuff fills with air and loses air. 10. Write down the numbers on the screen. 11. Wait 2-3 minutes and then repeat steps 1-10. What do the numbers mean? Two numbers make up your blood pressure. The first number is called systolic pressure. The second is called diastolic pressure. An example of a blood pressure reading is "120 over 80" (or 120/80). If you are an adult and do not have a medical condition, use this guide to find out if your blood pressure is normal: Normal  First number: below  120.  Second number: below 80. Elevated  First number: 120-129.  Second number: below 80. Hypertension stage 1  First number: 130-139.  Second number: 80-89. Hypertension stage 2  First number: 140 or above.  Second number: 34 or above. Your blood pressure is above normal even if only the top or bottom number is above normal. Follow these instructions at home:  Check your blood pressure as often as your doctor tells you to.  Take your monitor to your next doctor's appointment. Your doctor will: ? Make sure you are using it correctly. ? Make sure it is working right.  Make sure you understand what your blood pressure numbers should be.  Tell your doctor if your medicines are causing side effects. Contact a doctor if:  Your blood pressure keeps being high. Get help right away if:  Your first blood pressure number is higher than 180.  Your second blood  pressure number is higher than 120. This information is not intended to replace advice given to you by your health care provider. Make sure you discuss any questions you have with your health care provider. Document Revised: 08/04/2017 Document Reviewed: 01/29/2016 Elsevier Patient Education  [employer].    If you have lab work done today you will be contacted with your lab results within the next 2 weeks.  If you have not heard from [place] then please contact us. The fastest way to get your results is to register for My Chart.   IF you received an x-ray today, you will receive an invoice from [facility]. Please contact [facility] at [phone] with questions or concerns regarding your invoice.   IF you received labwork today, you will receive an invoice from [place]. Please contact [facility] at [phone] with questions or concerns regarding your invoice.   Our billing staff will not be able to assist you with questions regarding bills from these companies.  You will be contacted with the  lab results as soon as they are available. The fastest way to get your results is to activate your My Chart account. Instructions are located on the last page of this paperwork. If you have not heard from [place] regarding the results in 2 weeks, please contact this office.

## 2019-09-26 NOTE — Progress Notes (Signed)
Subjective:  Patient ID: Gina Pace, female    DOB: 09/22/1924  Age: 86 y.o. MRN: [id number]  CC:  Chief Complaint  Patient presents with  . Edema    in the pt's face. pt feels presure in her head and face. pt isn't sure if its her sinusus or if it is related to pt's BP.  Marland Kitchen Follow-up    on urinary incontinence. pt would like to do a urine test to make sure her infection is completly gone.    HPI Gina Pace presents for   Hypertension: Amlodipine 5 mg daily. When took meds - felt  like gave her a headache with full pill. Now taking 1/2 pill twice per day. Working ok.  Met with cardiology - Dr. Stanford Breed last year - continued 77m BID at that time. Note reviewed Home readings: 159/85, 140/84, 149/82, 158/77. Not sitting down prior to checking BP at times.   BP Readings from Last 3 Encounters:  09/26/19 132/88  05/31/19 (!) 160/70  04/19/19 (!) 146/70   Lab Results  Component Value Date   CREATININE 0.85 03/02/2019   Cheek swelling: Noticed past week - goes up and down in past week. No rash/redness. Left side feels puffy at times.  No injury.  No speech difficulty or face droop.  New symptom.  improves with taking allegra (1/2 BID).Some sinus congestion in the morning.    Urinary incontinence: Last discussed with Dr. HLinna Darnerin September 2020. Note reviewed - Treated for cystitis with Cipro at that time.  E. coli UTI on urine culture greater than 100,000 colony-forming units, pansensitive.  Did not tolerate meds for incontinence in past - wears incontinence pad/undregartment.  No dysuria, abd pain, fever, or new urinary symptoms. No hematuria.     History Patient Active Problem List   Diagnosis Date Noted  . Elevated blood pressure reading without diagnosis of hypertension 02/14/2019  . Dizziness and giddiness 02/14/2019   Past Medical History:  Diagnosis Date  . Allergy   . Cataract   . GI bleed   . Hypertension 02/2019   Newly diagnosed   .  Osteoporosis   . Tuberculosis   . Ulcer of gastric fundus, acute    Past Surgical History:  Procedure Laterality Date  . ABDOMINAL HYSTERECTOMY    . APPENDECTOMY    . Bladder     Bladder tack  . EYE SURGERY    . Rectum     Rectal tear repair   Allergies  Allergen Reactions  . Chocolate Anaphylaxis    Migraines   . Prednisone Hypertension  . Shellfish Allergy   . Clindamycin/Lincomycin   . Erythromycin   . Lisinopril     Angioedema   . Penicillins   . Sulfa Antibiotics    Prior to Admission medications   Medication Sig Start Date End Date Taking? Authorizing Provider  Acetaminophen (TYLENOL PO) Take by mouth.   Yes [provider]  amLODipine (NORVASC) 5 MG tablet TAKE 1 TABLET BY MOUTH TWICE A DAY 08/21/19  Yes Stallings, Zoe A, MD  ciprofloxacin (CIPRO) 250 MG tablet Take 1 tablet (250 mg total) by mouth 2 (two) times daily. 05/31/19  Yes HPosey Boyer MD  fexofenadine (ALLEGRA) 180 MG tablet Take 180 mg by mouth daily.   Yes [provider]   Social History   Socioeconomic History  . Marital status: Widowed    Spouse name: Not on file  . Number of children: 3  . Years of education:  Not on file  . Highest education level: Not on file  Occupational History  . Occupation: Clinical cytogeneticist    Comment: Retired   . Occupation: Higher education careers adviser     Comment: Retired   Tobacco Use  . Smoking status: Never Smoker  . Smokeless tobacco: Never Used  Substance and Sexual Activity  . Alcohol use: Yes    Comment: 1 Glass of Scotch a Night  . Drug use: Never  . Sexual activity: Not Currently  Other Topics Concern  . Not on file  Social History Narrative   Has 3 children 2 daughters and a son, who are very attentive.    Lives alone in a townhouse.    Able to perform all ADL's   Exercises daily   Belongs to social clubs   Does line dancing.    Social Determinants of Health   Financial Resource Strain:   . Difficulty of Paying Living Expenses: Not  on file  Food Insecurity:   . Worried About Charity fundraiser in the Last Year: Not on file  . Ran Out of Food in the Last Year: Not on file  Transportation Needs:   . Lack of Transportation (Medical): Not on file  . Lack of Transportation (Non-Medical): Not on file  Physical Activity:   . Days of Exercise per Week: Not on file  . Minutes of Exercise per Session: Not on file  Stress:   . Feeling of Stress : Not on file  Social Connections:   . Frequency of Communication with Friends and Family: Not on file  . Frequency of Social Gatherings with Friends and Family: Not on file  . Attends Religious Services: Not on file  . Active Member of Clubs or Organizations: Not on file  . Attends Archivist Meetings: Not on file  . Marital Status: Not on file  Intimate Partner Violence:   . Fear of Current or Ex-Partner: Not on file  . Emotionally Abused: Not on file  . Physically Abused: Not on file  . Sexually Abused: Not on file    Review of Systems   Objective:   Vitals:   09/26/19 1159  BP: 132/88  Pulse: 84  Temp: 98 F (36.7 C)  TempSrc: Temporal  SpO2: 97%  Weight: 120 lb (54.4 kg)  Height: 5' (1.524 m)     Physical Exam Vitals reviewed.  Constitutional:      Appearance: She is well-developed.  HENT:     Head: Normocephalic and atraumatic.     Comments: Possible slight prominence of skin left face versus right but I do not appreciate a significant amount of facial swelling.  Sinuses were nontender.  No active discharge from nasal passages.  Exam was performed with patient holding breath, temporarily removed mask.    Nose: No congestion or rhinorrhea.  Eyes:     Extraocular Movements: Extraocular movements intact.     Conjunctiva/sclera: Conjunctivae normal.     Pupils: Pupils are equal, round, and reactive to light.  Neck:     Vascular: No carotid bruit.  Cardiovascular:     Rate and Rhythm: Normal rate and regular rhythm.     Heart sounds: Normal  heart sounds.  Pulmonary:     Effort: Pulmonary effort is normal.     Breath sounds: Normal breath sounds. No stridor. No wheezing.  Abdominal:     Palpations: Abdomen is soft. There is no pulsatile mass.     Tenderness: There is no abdominal tenderness.  Skin:    General: Skin is warm and dry.  Neurological:     Mental Status: She is alert and oriented to person, place, and time.  Psychiatric:        Behavior: Behavior normal.    Results for orders placed or performed in visit on 09/26/19  POCT urinalysis dipstick  Result Value Ref Range   Color, UA yellow yellow   Clarity, UA clear clear   Glucose, UA negative negative mg/dL   Bilirubin, UA negative negative   Ketones, POC UA negative negative mg/dL   Spec Grav, UA 1.015 1.010 - 1.025   Blood, UA negative negative   pH, UA 6.5 5.0 - 8.0   Protein Ur, POC negative negative mg/dL   Urobilinogen, UA negative (A) 0.2 or 1.0 E.U./dL   Nitrite, UA Negative Negative   Leukocytes, UA Negative Negative       Assessment & Plan:  Gina Pace is a 86 y.o. female . Urinary incontinence, unspecified type - Plan: POCT urinalysis dipstick  -Intolerant to previous medications.  Prior treatment for E. coli UTI, discussed potential for asymptomatic bacteriuria with testing today but she would like me to repeat test.  Would not start antibiotics at this time unless new or worsening symptoms.  Essential hypertension - Plan: amLODipine (NORVASC) 2.5 MG tablet, Basic metabolic panel  -Stable in office, potentially elevated readings at home, may not be accurate.  Handout given on appropriate monitoring at home with follow-up TeleMed visit in 2 weeks.  Sinus congestion - Plan: fluticasone (FLONASE) 50 MCG/ACT nasal spray  -Suspect component of allergies.  Did not appreciate significant amount of facial swelling in office.  She does report that improves after waking up.  Okay to continue Allegra, short-term trial of Flonase nasal spray  low-dose recommended.  RTC precautions, recheck 2 weeks  Meds ordered this encounter  Medications  . amLODipine (NORVASC) 2.5 MG tablet    Sig: Take 1 tablet (2.5 mg total) by mouth 2 (two) times daily.    Dispense:  90 tablet    Refill:  3  . fluticasone (FLONASE) 50 MCG/ACT nasal spray    Sig: Place 1 spray into both nostrils daily.    Dispense:  16 g    Refill:  1   Patient Instructions   See info below on how to check BP at home. Amlodipine 2.19m full pill twice per day for now. Keep a record of your blood pressures outside of the office for follow up virtual visit in the next 2 weeks  I do not see any concerning amount of face swelling today.  Okay to continue the Allegra, try 1 spray of Flonase in each nostril once per day to see if that will also help with sinus congestion and allergies.  Can stop that medicine if you have new side effects.  Follow-up in 2 weeks, sooner or to the emergency room or urgent care if any acute worsening including worsening swelling in face or other areas.    Urinary Incontinence  Urinary incontinence refers to a condition in which a person is unable to control where and when to pass urine. A person with this condition will urinate when he or she does not mean to (involuntarily). What are the causes? This condition may be caused by:  Medicines.  Infections.  Constipation.  Overactive bladder muscles.  Weak bladder muscles.  Weak pelvic floor muscles. These muscles provide support for the bladder, intestine, and, in women, the uterus.  Enlarged prostate  in men. The prostate is a gland near the bladder. When it gets too big, it can pinch the urethra. With the urethra blocked, the bladder can weaken and lose the ability to empty properly.  Surgery.  Emotional factors, such as anxiety, stress, or post-traumatic stress disorder (PTSD).  Pelvic organ prolapse. This happens in women when organs shift out of place and into the vagina. This shift  can prevent the bladder and urethra from working properly. What increases the risk? The following factors may make you more likely to develop this condition:  Older age.  Obesity and physical inactivity.  Pregnancy and childbirth.  Menopause.  Diseases that affect the nerves or spinal cord (neurological diseases).  Long-term (chronic) coughing. This can increase pressure on the bladder and pelvic floor muscles. What are the signs or symptoms? Symptoms may vary depending on the type of urinary incontinence you have. They include:  A sudden urge to urinate, but passing urine involuntarily before you can get to a bathroom (urge incontinence).  Suddenly passing urine with any activity that forces urine to pass, such as coughing, laughing, exercise, or sneezing (stress incontinence).  Needing to urinate often, but urinating only a small amount, or constantly dribbling urine (overflow incontinence).  Urinating because you cannot get to the bathroom in time due to a physical disability, such as arthritis or injury, or communication and thinking problems, such as Alzheimer disease (functional incontinence). How is this diagnosed? This condition may be diagnosed based on:  Your medical history.  A physical exam.  Tests, such as: ? Urine tests. ? X-rays of your kidney and bladder. ? Ultrasound. ? CT scan. ? Cystoscopy. In this procedure, a health care provider inserts a tube with a light and camera (cystoscope) through the urethra and into the bladder in order to check for problems. ? Urodynamic testing. These tests assess how well the bladder, urethra, and sphincter can store and release urine. There are different types of urodynamic tests, and they vary depending on what the test is measuring. To help diagnose your condition, your health care provider may recommend that you keep a log of when you urinate and how much you urinate. How is this treated? Treatment for this condition  depends on the type of incontinence that you have and its cause. Treatment may include:  Lifestyle changes, such as: ? Quitting smoking. ? Maintaining a healthy weight. ? Staying active. Try to get 150 minutes of moderate-intensity exercise every week. Ask your health care provider which activities are safe for you. ? Eating a healthy diet.  Avoid high-fat foods, like fried foods.  Avoid refined carbohydrates like white bread and white rice.  Limit how much alcohol and caffeine you drink.  Increase your fiber intake. Foods such as fresh fruits, vegetables, beans, and whole grains are healthy sources of fiber.  Pelvic floor muscle exercises.  Bladder training, such as lengthening the amount of time between bathroom breaks, or using the bathroom at regular intervals.  Using techniques to suppress bladder urges. This can include distraction techniques or controlled breathing exercises.  Medicines to relax the bladder muscles and prevent bladder spasms.  Medicines to help slow or prevent the growth of a man's prostate.  Botox injections. These can help relax the bladder muscles.  Using pulses of electricity to help change bladder reflexes (electrical nerve stimulation).  For women, using a medical device to prevent urine leaks. This is a small, tampon-like, disposable device that is inserted into the urethra.  Injecting collagen  or carbon beads (bulking agents) into the urinary sphincter. These can help thicken tissue and close the bladder opening.  Surgery. Follow these instructions at home: Lifestyle  Limit alcohol and caffeine. These can fill your bladder quickly and irritate it.  Keep yourself clean to help prevent odors and skin damage. Ask your doctor about special skin creams and cleansers that can protect the skin from urine.  Consider wearing pads or adult diapers. Make sure to change them regularly, and always change them right after experiencing incontinence. General  instructions  Take over-the-counter and prescription medicines only as told by your health care provider.  Use the bathroom about every 3-4 hours, even if you do not feel the need to urinate. Try to empty your bladder completely every time. After urinating, wait a minute. Then try to urinate again.  Make sure you are in a relaxed position while urinating.  If your incontinence is caused by nerve problems, keep a log of the medicines you take and the times you go to the bathroom.  Keep all follow-up visits as told by your health care provider. This is important. Contact a health care provider if:  You have pain that gets worse.  Your incontinence gets worse. Get help right away if:  You have a fever or chills.  You are unable to urinate.  You have redness in your groin area or down your legs. Summary  Urinary incontinence refers to a condition in which a person is unable to control where and when to pass urine.  This condition may be caused by medicines, infection, weak bladder muscles, weak pelvic floor muscles, enlargement of the prostate (in men), or surgery.  The following factors increase your risk for developing this condition: older age, obesity, pregnancy and childbirth, menopause, neurological diseases, and chronic coughing.  There are several types of urinary incontinence. They include urge incontinence, stress incontinence, overflow incontinence, and functional incontinence.  This condition is usually treated first with lifestyle and behavioral changes, such as quitting smoking, eating a healthier diet, and doing regular pelvic floor exercises. Other treatment options include medicines, bulking agents, medical devices, electrical nerve stimulation, or surgery. This information is not intended to replace advice given to you by your health care provider. Make sure you discuss any questions you have with your health care provider. Document Revised: 09/01/2017 Document  Reviewed: 12/01/2016 Elsevier Patient Education  [place].    How to Take Your Blood Pressure You can take your blood pressure at home with a machine. You may need to check your blood pressure at home:  To check if you have high blood pressure (hypertension).  To check your blood pressure over time.  To make sure your blood pressure medicine is working. Supplies needed: You will need a blood pressure machine, or monitor. You can buy one at a drugstore or online. When choosing one:  Choose one with an arm cuff.  Choose one that wraps around your upper arm. Only one finger should fit between your arm and the cuff.  Do not choose one that measures your blood pressure from your wrist or finger. Your doctor can suggest a monitor. How to prepare Avoid these things for 30 minutes before checking your blood pressure:  Drinking caffeine.  Drinking alcohol.  Eating.  Smoking.  Exercising. Five minutes before checking your blood pressure:  Pee.  Sit in a dining chair. Avoid sitting in a soft couch or armchair.  Be quiet. Do not talk. How to take your  blood pressure Follow the instructions that came with your machine. If you have a digital blood pressure monitor, these may be the instructions: 1. Sit up straight. 2. Place your feet on the floor. Do not cross your ankles or legs. 3. Rest your left arm at the level of your heart. You may rest it on a table, desk, or chair. 4. Pull up your shirt sleeve. 5. Wrap the blood pressure cuff around the upper part of your left arm. The cuff should be 1 inch (2.5 cm) above your elbow. It is best to wrap the cuff around bare skin. 6. Fit the cuff snugly around your arm. You should be able to place only one finger between the cuff and your arm. 7. Put the cord inside the groove of your elbow. 8. Press the power button. 9. Sit quietly while the cuff fills with air and loses air. 10. Write down the numbers on the screen. 11. Wait  2-3 minutes and then repeat steps 1-10. What do the numbers mean? Two numbers make up your blood pressure. The first number is called systolic pressure. The second is called diastolic pressure. An example of a blood pressure reading is "120 over 80" (or 120/80). If you are an adult and do not have a medical condition, use this guide to find out if your blood pressure is normal: Normal  First number: below 120.  Second number: below 80. Elevated  First number: 120-129.  Second number: below 80. Hypertension stage 1  First number: 130-139.  Second number: 80-89. Hypertension stage 2  First number: 140 or above.  Second number: 75 or above. Your blood pressure is above normal even if only the top or bottom number is above normal. Follow these instructions at home:  Check your blood pressure as often as your doctor tells you to.  Take your monitor to your next doctor's appointment. Your doctor will: ? Make sure you are using it correctly. ? Make sure it is working right.  Make sure you understand what your blood pressure numbers should be.  Tell your doctor if your medicines are causing side effects. Contact a doctor if:  Your blood pressure keeps being high. Get help right away if:  Your first blood pressure number is higher than 180.  Your second blood pressure number is higher than 120. This information is not intended to replace advice given to you by your health care provider. Make sure you discuss any questions you have with your health care provider. Document Revised: 08/04/2017 Document Reviewed: 01/29/2016 Elsevier Patient Education  [employer].    If you have lab work done today you will be contacted with your lab results within the next 2 weeks.  If you have not heard from [place] then please contact us. The fastest way to get your results is to register for My Chart.   IF you received an x-ray today, you will receive an invoice from [facility]  Radiology. Please contact [facility]iology at [phone] with questions or concerns regarding your invoice.   IF you received labwork today, you will receive an invoice from [facility]. Please contact [facility] at [phone] with questions or concerns regarding your invoice.   Our billing staff will not be able to assist you with questions regarding bills from these companies.  You will be contacted with the lab results as soon as they are available. The fastest way to get your results is to activate your My Chart account. Instructions are located on the  last page of this paperwork. If you have not heard from [place] regarding the results in 2 weeks, please contact this office.         Signed, Merri Ray, MD [facility]

## 2019-09-27 LAB — BASIC METABOLIC PANEL
BUN/Creatinine Ratio: 16 (ref 12–28)
BUN: 14 mg/dL (ref 10–36)
CO2: 24 mmol/L (ref 20–29)
Calcium: 10.6 mg/dL — ABNORMAL HIGH (ref 8.7–10.3)
Chloride: 104 mmol/L (ref 96–106)
Creatinine, Ser: 0.88 mg/dL (ref 0.57–1.00)
GFR calc Af Amer: 66 mL/min/{1.73_m2} (ref >59)
GFR calc non Af Amer: 57 mL/min/{1.73_m2} — ABNORMAL LOW (ref >59)
Glucose: 86 mg/dL (ref 65–99)
Potassium: 4.5 mmol/L (ref 3.5–5.2)
Sodium: 142 mmol/L (ref 134–144)

## 2019-09-30 NOTE — Progress Notes (Deleted)
HPI: Follow-up hypertension. Patient does have a history of angioedema with lisinopril.  Echocardiogram July 2020 showed normal LV function, mild LVH, mild diastolic dysfunction, moderate left atrial enlargement, moderate mitral regurgitation, moderate aortic insufficiency and moderate pulmonic insufficiency.    Since last seen  Current Outpatient Medications  Medication Sig Dispense Refill  . Acetaminophen (TYLENOL PO) Take by mouth.    Marland Kitchen amLODipine (NORVASC) 2.5 MG tablet Take 1 tablet (2.5 mg total) by mouth 2 (two) times daily. 90 tablet 3  . ciprofloxacin (CIPRO) 250 MG tablet Take 1 tablet (250 mg total) by mouth 2 (two) times daily. 6 tablet 0  . fexofenadine (ALLEGRA) 180 MG tablet Take 180 mg by mouth daily.    . fluticasone (FLONASE) 50 MCG/ACT nasal spray Place 1 spray into both nostrils daily. 16 g 1   No current facility-administered medications for this visit.     Past Medical History:  Diagnosis Date  . Allergy   . Cataract   . GI bleed   . Hypertension 02/2019   Newly diagnosed   . Osteoporosis   . Tuberculosis   . Ulcer of gastric fundus, acute     Past Surgical History:  Procedure Laterality Date  . ABDOMINAL HYSTERECTOMY    . APPENDECTOMY    . Bladder     Bladder tack  . EYE SURGERY    . Rectum     Rectal tear repair    Social History   Socioeconomic History  . Marital status: Widowed    Spouse name: Not on file  . Number of children: 3  . Years of education: Not on file  . Highest education level: Not on file  Occupational History  . Occupation: Clinical cytogeneticist    Comment: Retired   . Occupation: Higher education careers adviser     Comment: Retired   Tobacco Use  . Smoking status: Never Smoker  . Smokeless tobacco: Never Used  Substance and Sexual Activity  . Alcohol use: Yes    Comment: 1 Glass of Scotch a Night  . Drug use: Never  . Sexual activity: Not Currently  Other Topics Concern  . Not on file  Social History Narrative   Has 3 children  2 daughters and a son, who are very attentive.    Lives alone in a townhouse.    Able to perform all ADL's   Exercises daily   Belongs to social clubs   Does line dancing.    Social Determinants of Health   Financial Resource Strain:   . Difficulty of Paying Living Expenses: Not on file  Food Insecurity:   . Worried About Charity fundraiser in the Last Year: Not on file  . Ran Out of Food in the Last Year: Not on file  Transportation Needs:   . Lack of Transportation (Medical): Not on file  . Lack of Transportation (Non-Medical): Not on file  Physical Activity:   . Days of Exercise per Week: Not on file  . Minutes of Exercise per Session: Not on file  Stress:   . Feeling of Stress : Not on file  Social Connections:   . Frequency of Communication with Friends and Family: Not on file  . Frequency of Social Gatherings with Friends and Family: Not on file  . Attends Religious Services: Not on file  . Active Member of Clubs or Organizations: Not on file  . Attends Archivist Meetings: Not on file  . Marital Status: Not on  file  Intimate Partner Violence:   . Fear of Current or Ex-Partner: Not on file  . Emotionally Abused: Not on file  . Physically Abused: Not on file  . Sexually Abused: Not on file    Family History  Problem Relation Age of Onset  . CAD Mother 62  . Heart attack Mother   . CAD Father 42  . Heart attack Father   . Congestive Heart Failure Sister   . CAD Sister        Hx of CABG  . Emphysema Brother     ROS: no fevers or chills, productive cough, hemoptysis, dysphasia, odynophagia, melena, hematochezia, dysuria, hematuria, rash, seizure activity, orthopnea, PND, pedal edema, claudication. Remaining systems are negative.  Physical Exam: Well-developed well-nourished in no acute distress.  Skin is warm and dry.  HEENT is normal.  Neck is supple.  Chest is clear to auscultation with normal expansion.  Cardiovascular exam is regular rate and  rhythm.  Abdominal exam nontender or distended. No masses palpated. Extremities show no edema. neuro grossly intact  ECG- personally reviewed  A/P  1 hypertension-blood pressure controlled.  Continue present medications and follow.  2 history of moderate aortic and mitral regurgitation-we will plan follow-up echocardiogram July 2021.  Given age would like to be conservative.  Kirk Ruths, MD

## 2019-10-03 ENCOUNTER — Ambulatory Visit: Payer: Medicare Other | Admitting: Cardiology

## 2019-10-10 ENCOUNTER — Telehealth (INDEPENDENT_AMBULATORY_CARE_PROVIDER_SITE_OTHER): Payer: Medicare Other | Admitting: Family Medicine

## 2019-10-10 ENCOUNTER — Telehealth: Payer: Self-pay | Admitting: Family Medicine

## 2019-10-10 ENCOUNTER — Other Ambulatory Visit: Payer: Self-pay

## 2019-10-10 VITALS — BP 141/68 | HR 77 | Ht 60.0 in | Wt 120.0 lb

## 2019-10-10 DIAGNOSIS — R0981 Nasal congestion: Secondary | ICD-10-CM | POA: Diagnosis not present

## 2019-10-10 DIAGNOSIS — R220 Localized swelling, mass and lump, head: Secondary | ICD-10-CM

## 2019-10-10 DIAGNOSIS — I10 Essential (primary) hypertension: Secondary | ICD-10-CM | POA: Diagnosis not present

## 2019-10-10 NOTE — Telephone Encounter (Addendum)
10/10/2019 - PATIENT HAD A TELEMED VISIT WITH DR. Carlota Raspberry ON Thursday (10/10/2019). DR. Carlota Raspberry HAS REQUESTED SHE HAVE AN IN-OFFICE APPOINTMENT IN 3 MONTHS. DR. Nolon Rod IS HER PRIMARY PROVIDER BUT DR. GREENE SAID HE WOULD BE HAPPY TO SEE HER. PLEASE ASK IF PATIENT WOULD PREFER DR. Carlota Raspberry OR DR. STALLINGS SO THAT WE CAN SEE HER FOR FOLLOW-UP IN MAY 2021. I TRIED TO CALL VINCENT Falk (SON) BUT HAD TO LEAVE HIM A VOICE MAIL TO RETURN MY CALL. I WAS ABLE TO GET DEBBIE MARKOWSKI (DAUGHTER) AND WE DID SCHEDULE MOM'S 3 MONTH IN MAY 2021. [place]

## 2019-10-10 NOTE — Progress Notes (Signed)
Virtual Visit via telephone Note  I connected with Gina Pace on 10/10/19 at 12:48 PM by phone - video declined. verified that I am speaking with the correct person using two identifiers.   I discussed the limitations, risks, security and privacy concerns of performing an evaluation and management service by telephone and the availability of in person appointments. I also discussed with the patient that there may be a patient responsible charge related to this service. The patient expressed understanding and agreed to proceed, consent obtained  Chief complaint:  Chief Complaint  Patient presents with  . Follow-up    hypertention. pt has facial swelling and thinks it maybe related to her hypertention. sinus presure has gotten better.      History of Present Illness: ACHANTE Pace is a 86 y.o. female  Hypertension:  Last visit with me January 21.  Was tolerating amlodipine 2.5 mg BID, full pill caused her headache.  Elevated home readings at that time but in office testing was okay.  She did note some cheek swelling at that time up and down in the previous week.  Improved with Allegra.  Some sinus congestion in the morning.  There was a slight possible prominence of the skin of her left face versus the right but I did not appreciate significant facial swelling on exam on January 21., sinuses were nontender, no active discharge from her nasal passages.  Flonase discussed as additional option to Allegra.  Home readings: 141/68 this am, 126/72 2 days ago. 140/82, 142/78. Usually 130-140/70's.   Feels like BP doing well.   BP Readings from Last 3 Encounters:  10/10/19 (!) 141/68  09/26/19 132/88  05/31/19 (!) 160/70   Lab Results  Component Value Date   CREATININE 0.88 09/26/2019   Facial swelling.   Tried flonase once - thinks caused BP to elevate to 190? - did not take again. Sinus pressure doing better.  Face feels swollen in am at times, improves during the day as  she gets up and gets moving. Felt like worse at higher dose of amlodpine prior.   No lip swelling, no cough/trouble swallowing or breathing. Allegra 1/2 pill BID has been helpful for sinus symptoms.  Hypercalcemia: Calcium 10.6 on last labs.   Patient Active Problem List   Diagnosis Date Noted  . Elevated blood pressure reading without diagnosis of hypertension 02/14/2019  . Dizziness and giddiness 02/14/2019   Past Medical History:  Diagnosis Date  . Allergy   . Cataract   . GI bleed   . Hypertension 02/2019   Newly diagnosed   . Osteoporosis   . Tuberculosis   . Ulcer of gastric fundus, acute    Past Surgical History:  Procedure Laterality Date  . ABDOMINAL HYSTERECTOMY    . APPENDECTOMY    . Bladder     Bladder tack  . EYE SURGERY    . Rectum     Rectal tear repair   Allergies  Allergen Reactions  . Chocolate Anaphylaxis    Migraines   . Prednisone Hypertension  . Shellfish Allergy   . Clindamycin/Lincomycin   . Erythromycin   . Lisinopril     Angioedema   . Penicillins   . Sulfa Antibiotics    Prior to Admission medications   Medication Sig Start Date End Date Taking? Authorizing Provider  Acetaminophen (TYLENOL PO) Take by mouth.   Yes [provider]  amLODipine (NORVASC) 2.5 MG tablet Take 1 tablet (2.5 mg total) by mouth 2 (two)  times daily. 09/26/19  Yes Wendie Agreste, MD  fexofenadine (ALLEGRA) 180 MG tablet Take 180 mg by mouth daily.   Yes [provider]  ciprofloxacin (CIPRO) 250 MG tablet Take 1 tablet (250 mg total) by mouth 2 (two) times daily. Patient not taking: Reported on 10/10/2019 05/31/19   Posey Boyer, MD  fluticasone [facility]) 50 MCG/ACT nasal spray Place 1 spray into both nostrils daily. Patient not taking: Reported on 10/10/2019 09/26/19   Wendie Agreste, MD   Social History   Socioeconomic History  . Marital status: Widowed    Spouse name: Not on file  . Number of children: 3  . Years of education: Not  on file  . Highest education level: Not on file  Occupational History  . Occupation: Clinical cytogeneticist    Comment: Retired   . Occupation: Higher education careers adviser     Comment: Retired   Tobacco Use  . Smoking status: Never Smoker  . Smokeless tobacco: Never Used  Substance and Sexual Activity  . Alcohol use: Yes    Comment: 1 Glass of Scotch a Night  . Drug use: Never  . Sexual activity: Not Currently  Other Topics Concern  . Not on file  Social History Narrative   Has 3 children 2 daughters and a son, who are very attentive.    Lives alone in a townhouse.    Able to perform all ADL's   Exercises daily   Belongs to social clubs   Does line dancing.    Social Determinants of Health   Financial Resource Strain:   . Difficulty of Paying Living Expenses: Not on file  Food Insecurity:   . Worried About Charity fundraiser in the Last Year: Not on file  . Ran Out of Food in the Last Year: Not on file  Transportation Needs:   . Lack of Transportation (Medical): Not on file  . Lack of Transportation (Non-Medical): Not on file  Physical Activity:   . Days of Exercise per Week: Not on file  . Minutes of Exercise per Session: Not on file  Stress:   . Feeling of Stress : Not on file  Social Connections:   . Frequency of Communication with Friends and Family: Not on file  . Frequency of Social Gatherings with Friends and Family: Not on file  . Attends Religious Services: Not on file  . Active Member of Clubs or Organizations: Not on file  . Attends Archivist Meetings: Not on file  . Marital Status: Not on file  Intimate Partner Violence:   . Fear of Current or Ex-Partner: Not on file  . Emotionally Abused: Not on file  . Physically Abused: Not on file  . Sexually Abused: Not on file    Observations/Objective: Vitals:   10/10/19 1150  BP: (!) 141/68  Pulse: 77  Weight: 120 lb (54.4 kg)  Height: 5' (1.524 m)  Speaking normally, full sentences, no respiratory distress,  appropriate responses.  All questions were answered with understanding of plan expressed  Assessment and Plan: Essential hypertension  Swelling of left side of face - Plan: Ambulatory referral to Allergy  Sinus congestion - Plan: Ambulatory referral to Allergy  Hypercalcemia  Blood pressure borderline at home but has had some lower readings.  Overall stable but based on comparison of home readings and in office reading last visit.  Continue same dose of amlodipine 1/2 pill twice daily.  In office recheck in 3 months.  Can recheck calcium  level at that time, with PTH if still elevated.  Stable, improved facial swelling, intermittent in morning only.  No lip or mouth symptoms,  no respiratory symptoms, doubt angioedema.  Will refer to allergist to discuss further as well as to review her questions regarding Covid vaccination and her multiple medication allergies.  Continue Allegra for sinus symptoms  RTC precautions given.   Follow Up Instructions: Refer to allergist, follow-up in office in 3 months.   I discussed the assessment and treatment plan with the patient. The patient was provided an opportunity to ask questions and all were answered. The patient agreed with the plan and demonstrated an understanding of the instructions.   The patient was advised to call back or seek an in-person evaluation if the symptoms worsen or if the condition fails to improve as anticipated.  I provided  15 minutes of non-face-to-face time during this encounter.   Wendie Agreste, MD

## 2019-10-14 NOTE — Progress Notes (Signed)
HPI: FU hypertension.  Patient does have a history of angioedema with lisinopril.  Chest CT June 2020 showed dilated pulmonary artery and cardiomegaly. Echocardiogram July 2020 showed normal LV function, mild LVH, mild diastolic dysfunction, moderate left atrial enlargement, moderate mitral regurgitation, moderate aortic insufficiency and moderate pulmonic insufficiency. Since last seen, she denies increased dyspnea, chest pain, palpitations or syncope.  No pedal edema.  Current Outpatient Medications  Medication Sig Dispense Refill  . Acetaminophen (TYLENOL PO) Take by mouth.    Marland Kitchen amLODipine (NORVASC) 2.5 MG tablet Take 1 tablet (2.5 mg total) by mouth 2 (two) times daily. 90 tablet 3  . fexofenadine (ALLEGRA) 180 MG tablet Take 180 mg by mouth daily.     No current facility-administered medications for this visit.     Past Medical History:  Diagnosis Date  . Allergy   . Cataract   . GI bleed   . Hypertension 02/2019   Newly diagnosed   . Osteoporosis   . Tuberculosis   . Ulcer of gastric fundus, acute     Past Surgical History:  Procedure Laterality Date  . ABDOMINAL HYSTERECTOMY    . APPENDECTOMY    . Bladder     Bladder tack  . EYE SURGERY    . Rectum     Rectal tear repair    Social History   Socioeconomic History  . Marital status: Widowed    Spouse name: Not on file  . Number of children: 3  . Years of education: Not on file  . Highest education level: Not on file  Occupational History  . Occupation: Clinical cytogeneticist    Comment: Retired   . Occupation: Higher education careers adviser     Comment: Retired   Tobacco Use  . Smoking status: Never Smoker  . Smokeless tobacco: Never Used  Substance and Sexual Activity  . Alcohol use: Yes    Comment: 1 Glass of Scotch a Night  . Drug use: Never  . Sexual activity: Not Currently  Other Topics Concern  . Not on file  Social History Narrative   Has 3 children 2 daughters and a son, who are very attentive.    Lives  alone in a townhouse.    Able to perform all ADL's   Exercises daily   Belongs to social clubs   Does line dancing.    Social Determinants of Health   Financial Resource Strain:   . Difficulty of Paying Living Expenses: Not on file  Food Insecurity:   . Worried About Charity fundraiser in the Last Year: Not on file  . Ran Out of Food in the Last Year: Not on file  Transportation Needs:   . Lack of Transportation (Medical): Not on file  . Lack of Transportation (Non-Medical): Not on file  Physical Activity:   . Days of Exercise per Week: Not on file  . Minutes of Exercise per Session: Not on file  Stress:   . Feeling of Stress : Not on file  Social Connections:   . Frequency of Communication with Friends and Family: Not on file  . Frequency of Social Gatherings with Friends and Family: Not on file  . Attends Religious Services: Not on file  . Active Member of Clubs or Organizations: Not on file  . Attends Archivist Meetings: Not on file  . Marital Status: Not on file  Intimate Partner Violence:   . Fear of Current or Ex-Partner: Not on file  . Emotionally  Abused: Not on file  . Physically Abused: Not on file  . Sexually Abused: Not on file    Family History  Problem Relation Age of Onset  . CAD Mother 55  . Heart attack Mother   . CAD Father 46  . Heart attack Father   . Congestive Heart Failure Sister   . CAD Sister        Hx of CABG  . Emphysema Brother     ROS: no fevers or chills, productive cough, hemoptysis, dysphasia, odynophagia, melena, hematochezia, dysuria, hematuria, rash, seizure activity, orthopnea, PND, pedal edema, claudication. Remaining systems are negative.  Physical Exam: Well-developed well-nourished in no acute distress.  Skin is warm and dry.  HEENT is normal.  Neck is supple.  Chest is clear to auscultation with normal expansion.  Cardiovascular exam is regular rate and rhythm.  Abdominal exam nontender or distended. No masses  palpated. Extremities show no edema. neuro grossly intact  Electrocardiogram today personally reviewed-sinus rhythm with PACs and PVCs, right bundle branch block. A/P  1 Hypertension-BP elevated; increase amlodipine to 5 mg twice daily and follow.  2 history of moderate aortic and mitral regurgitation-patient will need follow-up echoes in the future, though will be conservative if possible given age.  Kirk Ruths, MD

## 2019-10-15 ENCOUNTER — Ambulatory Visit: Payer: Medicare Other | Admitting: Cardiology

## 2019-10-23 ENCOUNTER — Other Ambulatory Visit: Payer: Self-pay

## 2019-10-23 ENCOUNTER — Encounter: Payer: Self-pay | Admitting: Cardiology

## 2019-10-23 ENCOUNTER — Ambulatory Visit: Payer: Medicare Other | Admitting: Cardiology

## 2019-10-23 VITALS — BP 168/82 | HR 70 | Ht 60.0 in | Wt 118.8 lb

## 2019-10-23 DIAGNOSIS — I351 Nonrheumatic aortic (valve) insufficiency: Secondary | ICD-10-CM | POA: Diagnosis not present

## 2019-10-23 DIAGNOSIS — I10 Essential (primary) hypertension: Secondary | ICD-10-CM

## 2019-10-23 MED ORDER — AMLODIPINE BESYLATE 5 MG PO TABS: 5.0000 mg | ORAL_TABLET | Freq: Two times a day (BID) | ORAL | 3 refills | Status: DC

## 2019-10-23 NOTE — Patient Instructions (Signed)
Medication Instructions:  INCREASE AMLODIPINE TO 5 MG TWICE DAILY= 2 OF THE 2.5 MG TABLETS TWICE DAILY  *If you need a refill on your cardiac medications before your next appointment, please call your pharmacy*  Lab Work: If you have labs (blood work) drawn today and your tests are completely normal, you will receive your results only by: Marland Kitchen MyChart Message (if you have MyChart) OR . A paper copy in the mail If you have any lab test that is abnormal or we need to change your treatment, we will call you to review the results.  Follow-Up: At [facility], you and your health needs are our priority.  As part of our continuing mission to provide you with exceptional heart care, we have created designated Provider Care Teams.  These Care Teams include your primary Cardiologist (physician) and Advanced Practice Providers (APPs -  Physician Assistants and Nurse Practitioners) who all work together to provide you with the care you need, when you need it.  Your next appointment:   6 month(s)  The format for your next appointment:   Either In Person or Virtual  Provider:   You may see Kirk Ruths MD or one of the following Advanced Practice Providers on your designated Care Team:    Kerin Ransom, PA-C  [place], [place]  Coletta Memos, [place]

## 2019-10-30 ENCOUNTER — Ambulatory Visit: Payer: Medicare Other | Admitting: Allergy

## 2019-10-30 ENCOUNTER — Other Ambulatory Visit: Payer: Self-pay

## 2019-10-30 ENCOUNTER — Encounter: Payer: Self-pay | Admitting: Allergy

## 2019-10-30 VITALS — BP 148/84 | HR 86 | Temp 98.1°F | Resp 18 | Ht 60.0 in | Wt 118.8 lb

## 2019-10-30 DIAGNOSIS — K9049 Malabsorption due to intolerance, not elsewhere classified: Secondary | ICD-10-CM

## 2019-10-30 DIAGNOSIS — T783XXD Angioneurotic edema, subsequent encounter: Secondary | ICD-10-CM | POA: Diagnosis not present

## 2019-10-30 DIAGNOSIS — J3089 Other allergic rhinitis: Secondary | ICD-10-CM | POA: Diagnosis not present

## 2019-10-30 NOTE — Progress Notes (Signed)
New Patient Note  RE: Gina Pace MRN: [id number] DOB: 30-Aug-1925 Date of Office Visit: 10/30/2019  Referring provider: Wendie Agreste, MD Primary care provider: Wendie Agreste, MD  Chief Complaint: Concern for food allergy  History of present illness: Gina Pace is a 86 y.o. female presenting today for consultation for sinus congestion and angioedema.  She has seen allergist in the past states she has had at least 2 allergy testing in the past.  The first testing she had done was in 1980 that she brought the results that reports she was sensitive at the time to [place], [place], [place] blue, meadow fescue, rye, short ragweed, red cedar, sugar maple, short leaf pine, mulberry, dust, feathers, cat hair, mold and fungi.  On food testing she had sensitivity to flaxseed, rice, wheat, oats, string beans, onion, apple, chocolate, pecan, shrimp.  She states she did have allergy testing done since 1980 but does not recall or have the results of this.  She continues to be very concerned about wheat/gluten products.  She states that every time she eats she develops sinus congestion and headaches.  She is concerned that what she is eating is triggering the symptoms.  She states she always knew that she was allergic to wheat.  However she states she does not really read labels to see what the products are in the foods.  She is concerned about wheat flour as she states this appears to be in a lot of foods.  However she also the headaches and the sinus congestion also happens after she eats apples and other foods that she is not sure what is triggering the symptoms. She states she stays away from fish.  She states after eating fish her eyes get "shiny" and she would not feel great.  She states she knows she is allergic to shrimp.   If she goes into a restaurant that prepares fish/shellfish she reports she can't breath and will have to leave the restaurant.   She has been having  headaches and sinus congestion after eating she states for years.  She takes 1/2 Human resources officer twice a day which she does feel helps.  She also will put vicks on her face to help as well.    She also reports having facial swelling.  She states last year had high BP issues and she was placed on lisonipril.  She had tongue swelling with that.  She was seen in the ED on February 21, 2019 for tongue swelling after starting lisinopril.  She was noted to have minimal swelling to the tongue on exam.  She was treated with IV fluids, Benadryl, Pepcid and Solu-Medrol.  Lisinopril was stopped. She was changed to amlodipine.  She states initially the amlodipine was not controlling her blood pressure and she was increased to 5mg ; she states her BP was still increased and she was feeling pressure in her head as well as some numbness and tingling.  She went to ED. she had a CT head without any acute findings.  She also had a chest CT done that was unremarkable after having a chest x-ray that was some concern for an opacity. She states the amlodipine was increased to 10mg .  Her BP spiked again and she went back to ED.  She felt like her tongue was swollen and she states her face felt like she was having a stroke.  She continues on the amlodipine.  She states now in the morning she does feel like her  face is swollen on the left side and it does go down after she takes her allegra and her amlodipine.  She states over the past 3 days or so it has been much improved.  She does not endorse having any itching or hives with this swelling.  Review of systems: Review of Systems  Constitutional: Negative.   HENT: Positive for congestion and sinus pain.   Eyes: Negative.   Respiratory: Negative.   Cardiovascular: Negative.   Gastrointestinal: Negative.   Musculoskeletal: Negative.   Skin: Negative.   Neurological: Positive for headaches.    All other systems negative unless noted above in HPI  Past medical history: Past Medical  History:  Diagnosis Date  . Allergy   . Cataract   . GI bleed   . Hypertension 02/2019   Newly diagnosed   . Osteoporosis   . Tuberculosis   . Ulcer of gastric fundus, acute     Past surgical history: Past Surgical History:  Procedure Laterality Date  . ABDOMINAL HYSTERECTOMY    . APPENDECTOMY    . Bladder     Bladder tack  . EYE SURGERY    . Rectum     Rectal tear repair    Family history:  Family History  Problem Relation Age of Onset  . CAD Mother 94  . Heart attack Mother   . CAD Father 31  . Heart attack Father   . Congestive Heart Failure Sister   . CAD Sister        Hx of CABG  . Emphysema Brother     Social history: She lives alone in a townhome (husband died 2 years ago) without carpeting with electric heating.  No pets in the home.  No concern for water damage, mildew or roaches in the home.  She is retired.  She has no smoking history.  Medication List: Current Outpatient Medications  Medication Sig Dispense Refill  . Acetaminophen (TYLENOL PO) Take by mouth.    Marland Kitchen amLODipine (NORVASC) 5 MG tablet Take 1 tablet (5 mg total) by mouth 2 (two) times daily. 180 tablet 3  . fexofenadine (ALLEGRA) 180 MG tablet Take 180 mg by mouth daily.     No current facility-administered medications for this visit.    Known medication allergies: Allergies  Allergen Reactions  . Chocolate Anaphylaxis    Migraines   . Prednisone Hypertension  . Shellfish Allergy   . Clindamycin/Lincomycin   . Erythromycin   . Lisinopril     Angioedema   . Penicillins   . Sulfa Antibiotics      Physical examination: Blood pressure (!) 148/84, pulse 86, temperature 98.1 F (36.7 C), temperature source Temporal, resp. rate 18, height 5' (1.524 m), weight 118 lb 12.8 oz (53.9 kg), SpO2 97 %.  General: Alert, interactive, in no acute distress. HEENT: PERRLA, TMs pearly gray, turbinates mildly edematous without discharge, post-pharynx non erythematous. Neck: Supple without  lymphadenopathy. Lungs: Clear to auscultation without wheezing, rhonchi or rales. {no increased work of breathing. CV: Normal S1, S2 without murmurs. Abdomen: Nondistended, nontender. Skin: Warm and dry, without lesions or rashes. Extremities:  No clubbing, cyanosis or edema. Neuro:   Grossly intact.  Diagnositics/Labs:  Allergy testing: unable to perform due to antihistamine use  Assessment and plan: Allergic rhinitis Angioedema Food intolerance versus allergy   -She is concern for possible food allergy triggering her sinus symptoms as well as headache after eating. She is concerned that she is still allergic to wheat and potentially new  foods at this time.  We were unable to perform skin testing today due to recent antihistamine use the serum IgE levels were done for foods noted to be in her diet.  We will await these results and this will determine if she needs to have access to an epinephrine device. -We also discussed that in her history of environmental allergies could also be the trigger of her sinus congestion and the headache is likely related to her congestion.  We will obtain an environmental allergy panel for updated sensitivities to allergens.  We will be able to provide with avoidance measures based on the results of her environmental allergy testing. -In regards to her facial swelling I do agree that her initial tongue swelling was likely triggered by lisinopril use which was appropriately stopped.  However she has continued to report having left-sided facial swelling.  She does not endorse any itching or hives with this.  Thus there is concern that this may not be histamine driven and may be bradykinin driven which would indicate hereditary angioedema which we will perform a panel for.  We will also obtain a tryptase level to ensure that she does not potentially have a mast cell overactivity. -Agree with her continuing her Allegra half dose twice a day as she is doing thus far.  -For better management of congestion and headache recommend she use nasal saline spray.  She may benefit from use of a nasal steroid spray like Nasacort or Rhinocort.  Further management will be based on the lab results.  Follow-up in 4 months or sooner if needed  I appreciate the opportunity to take part in Kissie's care. Please do not hesitate to contact me with questions.  Sincerely,   Prudy Feeler, MD Allergy/Immunology [facility]

## 2019-10-31 NOTE — Telephone Encounter (Signed)
Schedule AWV.  

## 2019-10-31 NOTE — Patient Instructions (Addendum)
-  She is concerned for possible food allergy triggering her sinus symptoms as well as headache after eating. She is concerned that she is still allergic to wheat and potentially new foods at this time.  We were unable to perform skin testing today due to recent antihistamine use the serum IgE levels were done for foods noted to be in her diet.  We will await these results and this will determine if she needs to have access to an epinephrine device. -We also discussed that in her history of environmental allergies could also be the trigger of her sinus congestion and the headache is likely related to her congestion.  We will obtain an environmental allergy panel for updated sensitivities to allergens.  We will be able to provide with avoidance measures based on the results of her environmental allergy testing. -In regards to her facial swelling I do agree that her initial tongue swelling was likely triggered by lisinopril use which was appropriately stopped.  However she has continued to report having left-sided facial swelling.  She does not endorse any itching or hives with this.  Thus there is concern that this may not be histamine driven and may be bradykinin driven which would indicate hereditary angioedema which we will perform a panel for.  We will also obtain a tryptase level to ensure that she does not potentially have a mast cell overactivity. -Agree with her continuing her Allegra half dose twice a day as she is doing thus far. -For better management of congestion and headache recommend she use nasal saline spray.  She may benefit from use of a nasal steroid spray like Nasacort or Rhinocort.  Further management will be based on the lab results.  Follow-up in 4 months or sooner if needed   **We are ordering labs, so please allow 1-2 weeks for the results to come back.  With the newly implemented Cures Act, the labs might be visible to you at the same time that they become visible to me.  However,  I will not address the results until all of the results come  back, so please be patient.  In the meantime, continue avoiding your triggering food(s) in your After Visit Summary, including avoidance measures (if applicable), until you hear from me about the results.

## 2019-11-05 DIAGNOSIS — T783XXD Angioneurotic edema, subsequent encounter: Secondary | ICD-10-CM | POA: Diagnosis not present

## 2019-11-06 LAB — ALLERGEN CANTALOUPE: Allergen Melon IgE: <0.1 kU/L

## 2019-11-06 LAB — ALLERGEN PROFILE, FOOD-GRAIN
Allergen Barley IgE: <0.1 kU/L
Allergen Oat IgE: <0.1 kU/L
Allergen Rice IgE: <0.1 kU/L
Rye IgE: <0.1 kU/L

## 2019-11-06 LAB — ALLERGEN PROFILE, VEGETABLE I
Allergen Broccoli: <0.1 kU/L
Allergen Cabbage IgE: <0.1 kU/L
Allergen Cauliflower IgE: <0.1 kU/L
Allergen Celery IgE: <0.1 kU/L
Allergen Cucumber IgE: <0.1 kU/L
Allergen Lettuce IgE: <0.1 kU/L
F214-IgE Spinach: <0.1 kU/L

## 2019-11-06 LAB — ALLERGENS(7)
Brazil Nut IgE: <0.1 kU/L
F020-IgE Almond: <0.1 kU/L
F202-IgE Cashew Nut: <0.1 kU/L
Hazelnut (Filbert) IgE: <0.1 kU/L
Pecan Nut IgE: <0.1 kU/L
Walnut IgE: <0.1 kU/L

## 2019-11-06 LAB — ALLERGEN PANEL, FOOD-BERRY
Allergen Blueberry IgE: <0.1 kU/L
Allergen Strawberry IgE: <0.1 kU/L
F343-IgE Raspberry: <0.1 kU/L

## 2019-11-06 LAB — HEREDITARY ANGIOEDEMA
C2 Esterase Inhibitor, Serum: 36 mg/dL (ref 21–39)
Complement C4, Serum: 22 mg/dL (ref 12–38)

## 2019-11-06 LAB — ALLERGEN PROFILE, BASIC FOOD
Allergen Corn, IgE: <0.1 kU/L
Beef IgE: <0.1 kU/L
Chocolate/Cacao IgE: <0.1 kU/L
Egg, Whole IgE: <0.1 kU/L
Food Mix (Seafoods) IgE: NEGATIVE
Milk IgE: <0.1 kU/L
Peanut IgE: <0.1 kU/L
Pork IgE: <0.1 kU/L
Soybean IgE: <0.1 kU/L
Wheat IgE: <0.1 kU/L

## 2019-11-06 LAB — ALPHA-GAL PANEL
Alpha Gal IgE*: <0.1 kU/L (ref <0.10)
Beef (Bos spp) IgE: <0.1 kU/L (ref <0.35)
Class Interpretation: 0
Class Interpretation: 0
Class Interpretation: 0
Lamb/Mutton (Ovis spp) IgE: <0.1 kU/L (ref <0.35)
Pork (Sus spp) IgE: <0.1 kU/L (ref <0.35)

## 2019-11-06 LAB — HAE INTERPRETATION:

## 2019-11-06 LAB — ALLERGEN PROFILE, VEGETABLE II
Allergen Carrot IgE: <0.1 kU/L
Allergen Green Bean IgE: <0.1 kU/L
Allergen Green Pea IgE: <0.1 kU/L
Allergen Onion IgE: <0.1 kU/L
Allergen Potato, White IgE: <0.1 kU/L
Allergen Tomato, IgE: <0.1 kU/L
Kidney Bean IgE: <0.1 kU/L
Pumpkin IgE: <0.1 kU/L

## 2019-11-06 LAB — C1 ESTERASE INHIBITOR, FUNC: C1 Est.Inhib.Funct.: 92 %mean normal

## 2019-11-06 LAB — ALLERGEN PROFILE, FOOD-FRUIT
Allergen Apple, IgE: <0.1 kU/L
Allergen Banana IgE: <0.1 kU/L
Allergen Grape IgE: <0.1 kU/L
Allergen Pear IgE: <0.1 kU/L
Allergen, Peach f95: <0.1 kU/L

## 2019-11-06 LAB — COMPLEMENT COMPONENT C1Q: Complement C1Q: 18.1 mg/dL (ref 10.3–20.5)

## 2019-11-06 LAB — TRYPTASE: Tryptase: 5.8 ug/L (ref 2.2–13.2)

## 2019-11-07 ENCOUNTER — Other Ambulatory Visit: Payer: Self-pay

## 2019-11-07 ENCOUNTER — Ambulatory Visit: Payer: Medicare Other

## 2019-11-08 LAB — ALLERGENS W/TOTAL IGE AREA 2
Alternaria Alternata IgE: <0.1 kU/L
Aspergillus Fumigatus IgE: <0.1 kU/L
Bermuda Grass IgE: <0.1 kU/L
Cat Dander IgE: <0.1 kU/L
Cedar, Mountain IgE: <0.1 kU/L
Cladosporium Herbarum IgE: <0.1 kU/L
Cockroach, German IgE: <0.1 kU/L
Common Silver Birch IgE: <0.1 kU/L
Cottonwood IgE: <0.1 kU/L
D Farinae IgE: <0.1 kU/L
D Pteronyssinus IgE: <0.1 kU/L
Dog Dander IgE: <0.1 kU/L
Elm, American IgE: <0.1 kU/L
IgE (Immunoglobulin E), Serum: 5 IU/mL — ABNORMAL LOW (ref 6–495)
Johnson Grass IgE: <0.1 kU/L
Maple/Box Elder IgE: <0.1 kU/L
Mouse Urine IgE: <0.1 kU/L
Oak, White IgE: <0.1 kU/L
Pecan, Hickory IgE: <0.1 kU/L
Penicillium Chrysogen IgE: <0.1 kU/L
Pigweed, Rough IgE: <0.1 kU/L
Ragweed, Short IgE: <0.1 kU/L
Sheep Sorrel IgE Qn: <0.1 kU/L
Timothy Grass IgE: <0.1 kU/L
White Mulberry IgE: <0.1 kU/L

## 2019-11-20 ENCOUNTER — Ambulatory Visit (INDEPENDENT_AMBULATORY_CARE_PROVIDER_SITE_OTHER): Payer: Medicare Other | Admitting: Family Medicine

## 2019-11-20 ENCOUNTER — Ambulatory Visit: Payer: Self-pay

## 2019-11-20 VITALS — BP 147/81 | Ht 60.0 in | Wt 118.0 lb

## 2019-11-20 DIAGNOSIS — Z0000 Encounter for general adult medical examination without abnormal findings: Secondary | ICD-10-CM

## 2019-11-20 NOTE — Patient Instructions (Signed)
Thank you for taking time to come for your Medicare Wellness Visit. I appreciate your ongoing commitment to your health goals. Please review the following plan we discussed and let me know if I can assist you in the future.  Gina Dumlao LPN  Preventive Care 84 Years and Older, Female Preventive care refers to lifestyle choices and visits with your health care provider that can promote health and wellness. This includes:  A yearly physical exam. This is also called an annual well check.  Regular dental and eye exams.  Immunizations.  Screening for certain conditions.  Healthy lifestyle choices, such as diet and exercise. What can I expect for my preventive care visit? Physical exam Your health care provider will check:  Height and weight. These may be used to calculate body mass index (BMI), which is a measurement that tells if you are at a healthy weight.  Heart rate and blood pressure.  Your skin for abnormal spots. Counseling Your health care provider may ask you questions about:  Alcohol, tobacco, and drug use.  Emotional well-being.  Home and relationship well-being.  Sexual activity.  Eating habits.  History of falls.  Memory and ability to understand (cognition).  Work and work environment.  Pregnancy and menstrual history. What immunizations do I need?  Influenza (flu) vaccine  This is recommended every year. Tetanus, diphtheria, and pertussis (Tdap) vaccine  You may need a Td booster every 10 years. Varicella (chickenpox) vaccine  You may need this vaccine if you have not already been vaccinated. Zoster (shingles) vaccine  You may need this after age 84. Pneumococcal conjugate (PCV13) vaccine  One dose is recommended after age 84. Pneumococcal polysaccharide (PPSV23) vaccine  One dose is recommended after age 84. Measles, mumps, and rubella (MMR) vaccine  You may need at least one dose of MMR if you were born in 1957 or later. You may also  need a second dose. Meningococcal conjugate (MenACWY) vaccine  You may need this if you have certain conditions. Hepatitis A vaccine  You may need this if you have certain conditions or if you travel or work in places where you may be exposed to hepatitis A. Hepatitis B vaccine  You may need this if you have certain conditions or if you travel or work in places where you may be exposed to hepatitis B. Haemophilus influenzae type b (Hib) vaccine  You may need this if you have certain conditions. You may receive vaccines as individual doses or as more than one vaccine together in one shot (combination vaccines). Talk with your health care provider about the risks and benefits of combination vaccines. What tests do I need? Blood tests  Lipid and cholesterol levels. These may be checked every 5 years, or more frequently depending on your overall health.  Hepatitis C test.  Hepatitis B test. Screening  Lung cancer screening. You may have this screening every year starting at age 84 if you have a 30-pack-year history of smoking and currently smoke or have quit within the past 15 years.  Colorectal cancer screening. All adults should have this screening starting at age 84 and continuing until age 75. Your health care provider may recommend screening at age 45 if you are at increased risk. You will have tests every 1-10 years, depending on your results and the type of screening test.  Diabetes screening. This is done by checking your blood sugar (glucose) after you have not eaten for a while (fasting). You may have this done every 1-3   years.  Mammogram. This may be done every 1-2 years. Talk with your health care provider about how often you should have regular mammograms.  BRCA-related cancer screening. This may be done if you have a family history of breast, ovarian, tubal, or peritoneal cancers. Other tests  Sexually transmitted disease (STD) testing.  Bone density scan. This is done  to screen for osteoporosis. You may have this done starting at age 84. Follow these instructions at home: Eating and drinking  Eat a diet that includes fresh fruits and vegetables, whole grains, lean protein, and low-fat dairy products. Limit your intake of foods with high amounts of sugar, saturated fats, and salt.  Take vitamin and mineral supplements as recommended by your health care provider.  Do not drink alcohol if your health care provider tells you not to drink.  If you drink alcohol: ? Limit how much you have to 0-1 drink a day. ? Be aware of how much alcohol is in your drink. In the [place] one drink equals one 12 oz bottle of beer (355 mL), one 5 oz glass of wine (148 mL), or one 1 oz glass of hard liquor (44 mL). Lifestyle  Take daily care of your teeth and gums.  Stay active. Exercise for at least 30 minutes on 5 or more days each week.  Do not use any products that contain nicotine or tobacco, such as cigarettes, e-cigarettes, and chewing tobacco. If you need help quitting, ask your health care provider.  If you are sexually active, practice safe sex. Use a condom or other form of protection in order to prevent STIs (sexually transmitted infections).  Talk with your health care provider about taking a low-dose aspirin or statin. What's next?  Go to your health care provider once a year for a well check visit.  Ask your health care provider how often you should have your eyes and teeth checked.  Stay up to date on all vaccines. This information is not intended to replace advice given to you by your health care provider. Make sure you discuss any questions you have with your health care provider. Document Revised: 08/16/2018 Document Reviewed: 08/16/2018 Elsevier Patient Education  2020 [employer].

## 2019-11-20 NOTE — Progress Notes (Signed)
Presents today for [employer] Visit   Date of last exam: 10/10/2019  Interpreter used for this visit? No  I connected with  Gina Pace on 11/20/19 by a telephone enabled telemedicine application and verified that I am speaking with the correct person using two identifiers.   I discussed the limitations of evaluation and management by telemedicine. The patient expressed understanding and agreed to proceed.   Patient Care Team: Wendie Agreste, MD as PCP - General (Family Medicine)   Other items to address today:   Discussed immunizations Discussed Eye/Dental States has had Bone Density, will call [facility] for report Follow 5-7 @ 11:00 am Dr. Carlota Raspberry   Other Screening: Last screening for diabetes: 03/01/2020 Last lipid screening: n/a  ADVANCE DIRECTIVES: Discussed: yes On File: yes Materials Provided: no  Immunization status:   There is no immunization history on file for this patient.   Health Maintenance Due  Topic Date Due  . TETANUS/TDAP  Never done  . DEXA SCAN  Never done     Functional Status Survey: Is the patient deaf or have difficulty hearing?: Yes(both ears) Does the patient have difficulty seeing, even when wearing glasses/contacts?: No Does the patient have difficulty concentrating, remembering, or making decisions?: No Does the patient have difficulty walking or climbing stairs?: No Does the patient have difficulty dressing or bathing?: No Does the patient have difficulty doing errands alone such as visiting a doctor's office or shopping?: No   6CIT Screen 11/20/2019  What Year? 0 points  What month? 0 points  What time? 0 points  Count back from 20 2 points  Months in reverse 2 points  Repeat phrase 0 points  Total Score 4        Clinical Support from 11/20/2019 in [place] at [place]  AUDIT-C Score  7       Home Environment:   Lives in [place] routine in morning  Has lunch with friends  once week No scattered rugs Yes grab bars Adequate lighting No trouble climbing stairs  Never does any vaccinations    Patient Active Problem List   Diagnosis Date Noted  . Elevated blood pressure reading without diagnosis of hypertension 02/14/2019  . Dizziness and giddiness 02/14/2019     Past Medical History:  Diagnosis Date  . Allergy   . Cataract   . GI bleed   . Hypertension 02/2019   Newly diagnosed   . Osteoporosis   . Tuberculosis   . Ulcer of gastric fundus, acute      Past Surgical History:  Procedure Laterality Date  . ABDOMINAL HYSTERECTOMY    . APPENDECTOMY    . Bladder     Bladder tack  . EYE SURGERY    . Rectum     Rectal tear repair     Family History  Problem Relation Age of Onset  . CAD Mother 39  . Heart attack Mother   . CAD Father 72  . Heart attack Father   . Congestive Heart Failure Sister   . CAD Sister        Hx of CABG  . Emphysema Brother      Social History   Socioeconomic History  . Marital status: Widowed    Spouse name: Not on file  . Number of children: 3  . Years of education: Not on file  . Highest education level: Not on file  Occupational History  . Occupation: Clinical cytogeneticist  Comment: Retired   . Occupation: Higher education careers adviser     Comment: Retired   Tobacco Use  . Smoking status: Never Smoker  . Smokeless tobacco: Never Used  Substance and Sexual Activity  . Alcohol use: Yes    Comment: 1 Glass of Scotch a Night  . Drug use: Never  . Sexual activity: Not Currently  Other Topics Concern  . Not on file  Social History Narrative   Has 3 children 2 daughters and a son, who are very attentive.    Lives alone in a townhouse.    Able to perform all ADL's   Exercises daily   Belongs to social clubs   Does line dancing.    Social Determinants of Health   Financial Resource Strain:   . Difficulty of Paying Living Expenses:   Food Insecurity:   . Worried About Charity fundraiser in the Last Year:   .  Arboriculturist in the Last Year:   Transportation Needs:   . Film/video editor (Medical):   Marland Kitchen Lack of Transportation (Non-Medical):   Physical Activity:   . Days of Exercise per Week:   . Minutes of Exercise per Session:   Stress:   . Feeling of Stress :   Social Connections:   . Frequency of Communication with Friends and Family:   . Frequency of Social Gatherings with Friends and Family:   . Attends Religious Services:   . Active Member of Clubs or Organizations:   . Attends Archivist Meetings:   Marland Kitchen Marital Status:   Intimate Partner Violence:   . Fear of Current or Ex-Partner:   . Emotionally Abused:   Marland Kitchen Physically Abused:   . Sexually Abused:      Allergies  Allergen Reactions  . Chocolate Anaphylaxis    Migraines   . Prednisone Hypertension  . Shellfish Allergy   . Clindamycin/Lincomycin   . Erythromycin   . Lisinopril     Angioedema   . Penicillins   . Sulfa Antibiotics      Prior to Admission medications   Medication Sig Start Date End Date Taking? Authorizing Provider  Acetaminophen (TYLENOL PO) Take by mouth.   Yes [provider]  amLODipine (NORVASC) 5 MG tablet Take 1 tablet (5 mg total) by mouth 2 (two) times daily. 10/23/19  Yes Lelon Perla, MD  fexofenadine (ALLEGRA) 180 MG tablet Take 180 mg by mouth daily.   Yes [provider]     Depression screen [facility] 2/9 11/20/2019 09/26/2019 05/31/2019 04/19/2019 03/18/2019  Decreased Interest 0 0 0 0 0  Down, Depressed, Hopeless 0 0 0 0 0  PHQ - 2 Score 0 0 0 0 0     Fall Risk  11/20/2019 09/26/2019 05/31/2019 04/19/2019 03/18/2019  Falls in the past year? 0 0 0 0 0  Number falls in past yr: 0 0 0 0 -  Injury with Fall? 0 0 0 0 -  Follow up Falls evaluation completed;Education provided - Falls evaluation completed Falls evaluation completed -      PHYSICAL EXAM: BP (!) 147/81 Comment: per patient  Ht 5' (1.524 m)   Wt 118 lb (53.5 kg)   BMI 23.05 kg/m    Wt  Readings from Last 3 Encounters:  11/20/19 118 lb (53.5 kg)  10/30/19 118 lb 12.8 oz (53.9 kg)  10/23/19 118 lb 12.8 oz (53.9 kg)       Education/Counseling provided regarding diet and exercise, prevention of chronic  diseases, smoking/tobacco cessation, if applicable, and reviewed "Covered Medicare Preventive Services."

## 2019-12-03 ENCOUNTER — Telehealth: Payer: Self-pay

## 2019-12-03 ENCOUNTER — Ambulatory Visit (INDEPENDENT_AMBULATORY_CARE_PROVIDER_SITE_OTHER): Payer: Medicare Other | Admitting: Registered Nurse

## 2019-12-03 ENCOUNTER — Encounter: Payer: Self-pay | Admitting: Registered Nurse

## 2019-12-03 ENCOUNTER — Other Ambulatory Visit: Payer: Self-pay

## 2019-12-03 VITALS — BP 174/79 | HR 82 | Temp 97.9°F | Ht 60.0 in | Wt 120.2 lb

## 2019-12-03 DIAGNOSIS — M79605 Pain in left leg: Secondary | ICD-10-CM | POA: Diagnosis not present

## 2019-12-03 DIAGNOSIS — L03116 Cellulitis of left lower limb: Secondary | ICD-10-CM

## 2019-12-03 DIAGNOSIS — M7989 Other specified soft tissue disorders: Secondary | ICD-10-CM | POA: Diagnosis not present

## 2019-12-03 MED ORDER — DOXYCYCLINE HYCLATE 50 MG PO CAPS: 50.0000 mg | ORAL_CAPSULE | Freq: Two times a day (BID) | ORAL | 0 refills | Status: DC

## 2019-12-03 NOTE — Patient Instructions (Signed)
° ° ° °  If you have lab work done today you will be contacted with your lab results within the next 2 weeks.  If you have not heard from us then please contact us. The fastest way to get your results is to register for My Chart. ° ° °IF you received an x-ray today, you will receive an invoice from [facility]. Please contact [facility] at [phone] with questions or concerns regarding your invoice.  ° °IF you received labwork today, you will receive an invoice from [facility]. Please contact [facility] at [phone] with questions or concerns regarding your invoice.  ° °Our billing staff will not be able to assist you with questions regarding bills from these companies. ° °You will be contacted with the lab results as soon as they are available. The fastest way to get your results is to activate your My Chart account. Instructions are located on the last page of this paperwork. If you have not heard from us regarding the results in 2 weeks, please contact this office. °  ° ° ° °

## 2019-12-04 ENCOUNTER — Telehealth: Payer: Self-pay | Admitting: Family Medicine

## 2019-12-04 NOTE — Telephone Encounter (Signed)
This is the medication we had discussed - I believe the doxycycline will be more effective for her infection than cipro. Given her history of intolerances, doxycycline is the best option available to [place]  Thank you  Kathrin Ruddy, NP

## 2019-12-04 NOTE — Telephone Encounter (Signed)
Patient think she has the wrong medication sent over to the pharmacy. Please Advise

## 2019-12-04 NOTE — Telephone Encounter (Signed)
Pt has an Rx for doxycycline (VIBRAMYCIN) 50 MG capsule That was prescribed but she states she would like to speak with nurse or Dr. Carlota Raspberry because she thinks its the wrong medication and thought she was suppose to be taking ciprofloxacin (CIPRO) 250 MG tablet  . Please advise asap/ Pts daughter is waiting to go to the pharmacy to pick up medication /please call Pts home phone

## 2019-12-04 NOTE — Telephone Encounter (Signed)
Called and spoke with patient and she stated that she is going to stick with the medication that she was given to give it a try and see which one is a better option.

## 2019-12-04 NOTE — Telephone Encounter (Signed)
Patient stated that she did not need to reschedule just need to know what medication she was being prescribed . She thought it was suppose to be the same medication from back in September. Although she did agree to take the new prescribed medication and wait for results. She was made aware that she should give [place] a call if she had any reactions and needed the medication to be changed.

## 2019-12-09 ENCOUNTER — Ambulatory Visit (HOSPITAL_COMMUNITY)
Admission: RE | Admit: 2019-12-09 | Discharge: 2019-12-09 | Disposition: A | Discharge status: Home / Self Care | Payer: Medicare Other | Source: Ambulatory Visit | Attending: Registered Nurse | Admitting: Registered Nurse

## 2019-12-09 ENCOUNTER — Other Ambulatory Visit: Payer: Self-pay

## 2019-12-09 ENCOUNTER — Ambulatory Visit (HOSPITAL_COMMUNITY): Payer: Medicare Other

## 2019-12-09 DIAGNOSIS — M7989 Other specified soft tissue disorders: Secondary | ICD-10-CM | POA: Diagnosis not present

## 2019-12-09 DIAGNOSIS — M79605 Pain in left leg: Secondary | ICD-10-CM | POA: Insufficient documentation

## 2019-12-09 NOTE — Progress Notes (Signed)
Bilateral lower extremity venous duplex has been completed. Preliminary results can be found in CV Proc through chart review.  Results were faxed to Lesia Sago NP office.  12/09/19 1:23 PM Carlos Levering RVT

## 2019-12-09 NOTE — Progress Notes (Signed)
FYI-   Saw Gina Pace last week for some redness and swelling LLE edema, cellulitis vs DVT. [place] rules out DVT. Per her request, she was started on a low dose of doxycycline given her allergies/intolerances. She has an appt sched with you in a few weeks and will follow up sooner if sxs dont resolve.  Rich

## 2019-12-15 ENCOUNTER — Encounter: Payer: Self-pay | Admitting: Registered Nurse

## 2019-12-15 NOTE — Progress Notes (Signed)
Acute Office Visit  Subjective:    Patient ID: Gina Pace, female    DOB: 05/27/1925, 86 y.o.   MRN: [id number]  Chief Complaint  Patient presents with  . Leg Pain    patient states she has been having left calf pain for about a week . per patient she has had a scar on her leg for about 60 years and now it has got a little worse and red and she dont know if its a blood clot. she deny any pain there just look worse    HPI Patient is in today for swelling and redness of LLE. Has come with left calf pain. Not claudication, feels more superficial.  Notes scarring on her leg from an accident around 60 years ago, which has provided some changes to the presentation of her symptoms.  Concerned for blood clot vs infection  No history of DVT, no preceding event that she is aware of  Past Medical History:  Diagnosis Date  . Allergy   . Cataract   . GI bleed   . Hypertension 02/2019   Newly diagnosed   . Osteoporosis   . Tuberculosis   . Ulcer of gastric fundus, acute     Past Surgical History:  Procedure Laterality Date  . ABDOMINAL HYSTERECTOMY    . APPENDECTOMY    . Bladder     Bladder tack  . EYE SURGERY    . Rectum     Rectal tear repair    Family History  Problem Relation Age of Onset  . CAD Mother 69  . Heart attack Mother   . CAD Father 65  . Heart attack Father   . Congestive Heart Failure Sister   . CAD Sister        Hx of CABG  . Emphysema Brother     Social History   Socioeconomic History  . Marital status: Widowed    Spouse name: Not on file  . Number of children: 3  . Years of education: Not on file  . Highest education level: Not on file  Occupational History  . Occupation: Clinical cytogeneticist    Comment: Retired   . Occupation: Higher education careers adviser     Comment: Retired   Tobacco Use  . Smoking status: Never Smoker  . Smokeless tobacco: Never Used  Substance and Sexual Activity  . Alcohol use: Yes    Comment: 1 Glass of Scotch a Night  .  Drug use: Never  . Sexual activity: Not Currently  Other Topics Concern  . Not on file  Social History Narrative   Has 3 children 2 daughters and a son, who are very attentive.    Lives alone in a townhouse.    Able to perform all ADL's   Exercises daily   Belongs to social clubs   Does line dancing.    Social Determinants of Health   Financial Resource Strain:   . Difficulty of Paying Living Expenses:   Food Insecurity:   . Worried About Charity fundraiser in the Last Year:   . Arboriculturist in the Last Year:   Transportation Needs:   . Film/video editor (Medical):   Marland Kitchen Lack of Transportation (Non-Medical):   Physical Activity:   . Days of Exercise per Week:   . Minutes of Exercise per Session:   Stress:   . Feeling of Stress :   Social Connections:   . Frequency of Communication with Friends and Family:   .  Frequency of Social Gatherings with Friends and Family:   . Attends Religious Services:   . Active Member of Clubs or Organizations:   . Attends Archivist Meetings:   Marland Kitchen Marital Status:   Intimate Partner Violence:   . Fear of Current or Ex-Partner:   . Emotionally Abused:   Marland Kitchen Physically Abused:   . Sexually Abused:     Outpatient Medications Prior to Visit  Medication Sig Dispense Refill  . Acetaminophen (TYLENOL PO) Take by mouth.    Marland Kitchen amLODipine (NORVASC) 5 MG tablet Take 1 tablet (5 mg total) by mouth 2 (two) times daily. 180 tablet 3  . fexofenadine (ALLEGRA) 180 MG tablet Take 180 mg by mouth daily.     No facility-administered medications prior to visit.    Allergies  Allergen Reactions  . Chocolate Anaphylaxis    Migraines   . Prednisone Hypertension  . Shellfish Allergy   . Clindamycin/Lincomycin   . Erythromycin   . Lisinopril     Angioedema   . Penicillins   . Sulfa Antibiotics     Review of Systems  Constitutional: Negative.   HENT: Negative.   Eyes: Negative.   Respiratory: Negative.   Cardiovascular: Positive  for leg swelling (unilateral, left). Negative for chest pain and palpitations.  Gastrointestinal: Negative.   Endocrine: Negative.   Genitourinary: Negative.   Musculoskeletal: Negative.   Skin: Positive for color change, rash and wound. Negative for pallor.  Allergic/Immunologic: Negative.   Neurological: Negative.   Hematological: Negative.   Psychiatric/Behavioral: Negative.   All other systems reviewed and are negative.      Objective:    Physical Exam Vitals and nursing note reviewed.  Constitutional:      General: She is not in acute distress.    Appearance: Normal appearance. She is normal weight. She is not ill-appearing, toxic-appearing or diaphoretic.  Cardiovascular:     Rate and Rhythm: Normal rate and regular rhythm.     Pulses: Normal pulses.  Pulmonary:     Effort: Pulmonary effort is normal. No respiratory distress.     Breath sounds: Normal breath sounds. No stridor. No wheezing, rhonchi or rales.  Chest:     Chest wall: No tenderness.  Skin:    General: Skin is warm and dry.     Capillary Refill: Capillary refill takes less than 2 seconds.     Coloration: Skin is not jaundiced or pale.     Findings: Erythema, lesion and rash present. No bruising. Rash is macular.     Comments: Left lower leg swollen, red, poorly defined rash, no clear lesion or wound, no purulent drainage. Strong pulses in posterior tibial and dorsalis pedis  Neurological:     General: No focal deficit present.     Mental Status: She is alert and oriented to person, place, and time. Mental status is at baseline.     Cranial Nerves: No cranial nerve deficit.     Sensory: No sensory deficit.     Motor: No weakness.     Coordination: Coordination normal.     Gait: Gait normal.     Deep Tendon Reflexes: Reflexes normal.  Psychiatric:        Mood and Affect: Mood normal.        Behavior: Behavior normal.        Thought Content: Thought content normal.        Judgment: Judgment normal.      BP (!) 174/79   Pulse 82  Temp 97.9 F (36.6 C) (Temporal)   Ht 5' (1.524 m)   Wt 120 lb 3.2 oz (54.5 kg)   SpO2 98%   BMI 23.47 kg/m  Wt Readings from Last 3 Encounters:  12/03/19 120 lb 3.2 oz (54.5 kg)  11/20/19 118 lb (53.5 kg)  10/30/19 118 lb 12.8 oz (53.9 kg)    Health Maintenance Due  Topic Date Due  . DEXA SCAN  Never done    There are no preventive care reminders to display for this patient.   Lab Results  Component Value Date   TSH 3.380 02/14/2019   Lab Results  Component Value Date   WBC 6.9 03/02/2019   HGB 13.3 03/02/2019   HCT 42.9 03/02/2019   MCV 94.9 03/02/2019   PLT 249 03/02/2019   Lab Results  Component Value Date   NA 142 09/26/2019   K 4.5 09/26/2019   CO2 24 09/26/2019   GLUCOSE 86 09/26/2019   BUN 14 09/26/2019   CREATININE 0.88 09/26/2019   BILITOT 0.4 03/02/2019   ALKPHOS 65 03/02/2019   AST 14 (L) 03/02/2019   ALT 8 03/02/2019   PROT 6.5 03/02/2019   ALBUMIN 3.9 03/02/2019   CALCIUM 10.6 (H) 09/26/2019   ANIONGAP 8 03/02/2019   No results found for: CHOL No results found for: HDL No results found for: LDLCALC No results found for: TRIG No results found for: CHOLHDL No results found for: HGBA1C     Assessment & Plan:   Problem List Items Addressed This Visit    None    Visit Diagnoses    Cellulitis of leg, left    -  Primary   Relevant Medications   doxycycline (VIBRAMYCIN) 50 MG capsule   Pain and swelling of left lower extremity       Relevant Orders   VAS [place] LOWER EXTREMITY VENOUS (DVT) (Completed)       Meds ordered this encounter  Medications  . doxycycline (VIBRAMYCIN) 50 MG capsule    Sig: Take 1 capsule (50 mg total) by mouth 2 (two) times daily.    Dispense:  10 capsule    Refill:  0    Order Specific Question:   Supervising Provider    Answer:   Forrest Moron [id number]   PLAN  Circulation appears strong on exam  Cellulitis likely  Pt asking for low dose to abx due to past  intolerance  Suggest she follow up closely with worsening or failure to improve  Will order [place] BLE to rule out clot given her age   Patient encouraged to call clinic with any questions, comments, or concerns.   Maximiano Coss, NP

## 2019-12-16 ENCOUNTER — Telehealth: Payer: Self-pay | Admitting: Family Medicine

## 2019-12-16 NOTE — Telephone Encounter (Signed)
Copied from [place] [id number]. Topic: General - Other >> Dec 16, 2019 10:39 AM Celene Kras wrote: Reason for CRM: Pt called stating that she saw the results from her doppler test showing that she did not have any blood clots. Pt is now requesting to know next steps. Please advise.

## 2019-12-16 NOTE — Telephone Encounter (Signed)
Pt now requesting next steps after negative doppler test?

## 2019-12-17 NOTE — Telephone Encounter (Signed)
LVM for pt to let her know that per Dr. Sherryl Manges that they should continue continue taking the antibiotic and for the to F/U if symptoms are not improving or getting worse.

## 2019-12-17 NOTE — Telephone Encounter (Signed)
It appears she was evaluated by Kathrin Ruddy for this.  She should continue the antibiotics, then follow-up if symptoms are not improving or acutely worsening.

## 2019-12-17 NOTE — Telephone Encounter (Signed)
Agree with Dr. Carlota Raspberry - continue abx. If worsening or no improvement, return to office to see myself or Dr. Carlota Raspberry.  Thank you,  Kathrin Ruddy, NP

## 2019-12-17 NOTE — Telephone Encounter (Signed)
Please Advise

## 2019-12-18 NOTE — Telephone Encounter (Signed)
Returned call. Pt will take doxycycline and call if leg worsens or fails to improve.   Kathrin Ruddy, NP

## 2019-12-18 NOTE — Telephone Encounter (Signed)
Hi Rich, patient has stopped medication Doxy and wants to speak to you. This is a back/forth message with this patient, my apologies. Thank you

## 2019-12-18 NOTE — Telephone Encounter (Signed)
Pt would like a call from [place] concerning her script. She now is not taking anything. Please advise at [phone]. I did read her Patricia's message from 12/17/19.

## 2019-12-18 NOTE — Telephone Encounter (Signed)
Hi Rich, the patient has stopped taking Doxy as prescribed, she is requesting to speak with you. I apologize for the inconvenience of back/forth with messages. Thank you

## 2019-12-30 DIAGNOSIS — H40013 Open angle with borderline findings, low risk, bilateral: Secondary | ICD-10-CM | POA: Diagnosis not present

## 2020-01-10 ENCOUNTER — Ambulatory Visit (INDEPENDENT_AMBULATORY_CARE_PROVIDER_SITE_OTHER): Payer: Medicare Other | Admitting: Family Medicine

## 2020-01-10 ENCOUNTER — Other Ambulatory Visit: Payer: Self-pay

## 2020-01-10 ENCOUNTER — Encounter: Payer: Self-pay | Admitting: Family Medicine

## 2020-01-10 VITALS — BP 171/76 | HR 92 | Temp 98.2°F | Ht 60.0 in | Wt 120.0 lb

## 2020-01-10 DIAGNOSIS — I10 Essential (primary) hypertension: Secondary | ICD-10-CM

## 2020-01-10 DIAGNOSIS — L03116 Cellulitis of left lower limb: Secondary | ICD-10-CM

## 2020-01-10 DIAGNOSIS — R519 Headache, unspecified: Secondary | ICD-10-CM

## 2020-01-10 DIAGNOSIS — I700 Atherosclerosis of aorta: Secondary | ICD-10-CM

## 2020-01-10 DIAGNOSIS — I77810 Thoracic aortic ectasia: Secondary | ICD-10-CM

## 2020-01-10 MED ORDER — CEPHALEXIN 500 MG PO CAPS: 500.0000 mg | ORAL_CAPSULE | Freq: Two times a day (BID) | ORAL | 0 refills | Status: DC

## 2020-01-10 NOTE — Progress Notes (Signed)
Subjective:  Patient ID: Gina Pace, female    DOB: Jan 22, 1925  Age: 86 y.o. MRN: [id number]  CC:  Chief Complaint  Patient presents with  . 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 }  . Edema    in pt's L leg calf mussle. pt states no pain in the area at all even when pt is vary active.     HPI Gina Pace presents for   Atherosclerotic  aorta/thoracic aortic ectasia. Coronary artery calcifications were noted on CT chest in June 2020 along with mild aortic calcifications.  Ascending aorta was mildly ectatic.  Not currently on statin, denies chest pains or anginal symptoms.  Moderate aortic and mitral valve regurgitation, planned ongoing echoes, no new dyspnea/CP/pedal edema.   Senile degeneration of brain CT head June 2020 for headaches.  No acute intracranial hemorrhage.  Mild age-related atrophy and chronic microvascular ischemic changes. Sinus headaches at times  - with change in weather. No new weakness.   Hypertension: Elevated readings at recent visits.  She has been treated with amlodipine 2.5mg  BID for hypertension previously. No missed doses of meds. Still taking 2.5mg  BID.   Cardiology appt Dr. Stanford Breed in 10/23/19, note reviewed - increased amlodipine to 5mg , but she never got that prescription. Feels like 10mg  too much in past.  Some sinus headache - takes allegra.  Home readings:158/77 earlier this week.  BP Readings from Last 3 Encounters:  01/10/20 (!) 171/76  12/03/19 (!) 174/79  11/20/19 (!) 147/81   Lab Results  Component Value Date   CREATININE 0.88 09/26/2019    Left calf swelling: Saw Kathrin Ruddy on 3/30. More swollen at that time. [place] negative for DVT. Treated with doxycycline for cellulitis. Ordered 100mg  BID. ? Shortness of breath as side effect, stomach upset. Changed to 1 pill every other day. Tolerated that dose. Off meds for awhile.  Some swelling if  standing longer, better if seated.  No pain, no cp/dyspnea. Red on side at times and front.  No fever.  pcn  - many years ago - unknown allergy, sulfa allergy.    History Patient Active Problem List   Diagnosis Date Noted  . Elevated blood pressure reading without diagnosis of hypertension 02/14/2019  . Dizziness and giddiness 02/14/2019   Past Medical History:  Diagnosis Date  . Allergy   . Cataract   . GI bleed   . Hypertension 02/2019   Newly diagnosed   . Osteoporosis   . Tuberculosis   . Ulcer of gastric fundus, acute    Past Surgical History:  Procedure Laterality Date  . ABDOMINAL HYSTERECTOMY    . APPENDECTOMY    . Bladder     Bladder tack  . EYE SURGERY    . Rectum     Rectal tear repair   Allergies  Allergen Reactions  . Chocolate Anaphylaxis    Migraines   . Prednisone Hypertension  . Shellfish Allergy   . Clindamycin/Lincomycin   . Erythromycin   . Lisinopril     Angioedema   . Penicillins   . Sulfa Antibiotics    Prior to Admission medications   Medication Sig Start Date End Date Taking? Authorizing Provider  Acetaminophen (TYLENOL PO) Take by mouth.   Yes [provider]  amLODipine (NORVASC) 5 MG tablet Take 1 tablet (5 mg total) by mouth 2 (two) times daily. 10/23/19  Yes Lelon Perla, MD  doxycycline (VIBRAMYCIN) 50  MG capsule Take 1 capsule (50 mg total) by mouth 2 (two) times daily. 12/03/19  Yes Maximiano Coss, NP  fexofenadine (ALLEGRA) 180 MG tablet Take 180 mg by mouth daily.   Yes [provider]   Social History   Socioeconomic History  . Marital status: Widowed    Spouse name: Not on file  . Number of children: 3  . Years of education: Not on file  . Highest education level: Not on file  Occupational History  . Occupation: Clinical cytogeneticist    Comment: Retired   . Occupation: Higher education careers adviser     Comment: Retired   Tobacco Use  . Smoking status: Never Smoker  . Smokeless tobacco: Never Used  Substance  and Sexual Activity  . Alcohol use: Yes    Comment: 1 Glass of Scotch a Night  . Drug use: Never  . Sexual activity: Not Currently  Other Topics Concern  . Not on file  Social History Narrative   Has 3 children 2 daughters and a son, who are very attentive.    Lives alone in a townhouse.    Able to perform all ADL's   Exercises daily   Belongs to social clubs   Does line dancing.    Social Determinants of Health   Financial Resource Strain:   . Difficulty of Paying Living Expenses:   Food Insecurity:   . Worried About Charity fundraiser in the Last Year:   . Arboriculturist in the Last Year:   Transportation Needs:   . Film/video editor (Medical):   Marland Kitchen Lack of Transportation (Non-Medical):   Physical Activity:   . Days of Exercise per Week:   . Minutes of Exercise per Session:   Stress:   . Feeling of Stress :   Social Connections:   . Frequency of Communication with Friends and Family:   . Frequency of Social Gatherings with Friends and Family:   . Attends Religious Services:   . Active Member of Clubs or Organizations:   . Attends Archivist Meetings:   Marland Kitchen Marital Status:   Intimate Partner Violence:   . Fear of Current or Ex-Partner:   . Emotionally Abused:   Marland Kitchen Physically Abused:   . Sexually Abused:     Review of Systems  Constitutional: Negative for fatigue and unexpected weight change.  Respiratory: Negative for chest tightness and shortness of breath.   Cardiovascular: Negative for chest pain, palpitations and leg swelling.  Gastrointestinal: Negative for abdominal pain and blood in stool.  Neurological: Positive for headaches (sinus headache only. ). Negative for dizziness, syncope and light-headedness.     Objective:   Vitals:   01/10/20 1118  BP: (!) 171/76  Pulse: 92  Temp: 98.2 F (36.8 C)  TempSrc: Temporal  SpO2: 98%  Weight: 120 lb (54.4 kg)  Height: 5' (1.524 m)     Physical Exam Vitals reviewed.  Constitutional:       Appearance: She is well-developed.  HENT:     Head: Normocephalic and atraumatic.  Eyes:     Conjunctiva/sclera: Conjunctivae normal.     Pupils: Pupils are equal, round, and reactive to light.  Neck:     Vascular: No carotid bruit.  Cardiovascular:     Rate and Rhythm: Normal rate and regular rhythm.     Heart sounds: Normal heart sounds.  Pulmonary:     Effort: Pulmonary effort is normal.     Breath sounds: Normal breath sounds.  Abdominal:  Palpations: Abdomen is soft. There is no pulsatile mass.     Tenderness: There is no abdominal tenderness.  Skin:    General: Skin is warm and dry.     Comments: Erythema, mild ttp over surface of R leg with stasis changes. Varicose veins.   Neurological:     Mental Status: She is alert and oriented to person, place, and time.  Psychiatric:        Behavior: Behavior normal.        Assessment & Plan:  Gina Pace is a 86 y.o. female . Cellulitis of leg, left - Plan: cephALEXin (KEFLEX) 500 MG capsule  - subacute cellulitis vs stasis dermatitis. Minimal edema at present. Previous partial treatment with lower than prescribed dose antibiotics.   - intolerant to doxycycline. No known anaphylaxis or sever reaction to penicillin. Second MD exam and discussion. Will try Keflex with precautions on cross reactivity with penicillin and ER precautions if new side effects. Understanding expressed.  Recheck 1 week.   Essential hypertension  - decreased control. Will try 7.5mg  amlodipine as intolerant to 10mg  in past. 1 week recheck.   Nonintractable episodic headache, unspecified headache type senile degeneration of brain.  - nonfocal neuro exam. Elevated BP as above. Initial trial of higher dose, consider neuro eval or imaging if persistent.    Atherosclerosis of aorta (HCC) Thoracic aortic ectasia (HCC)  - no anginal symptoms. Followed by cardiology with ongoing follow up echo plans for aortic and mitral valve regurgitation.    Meds ordered this encounter  Medications  . cephALEXin (KEFLEX) 500 MG capsule    Sig: Take 1 capsule (500 mg total) by mouth 2 (two) times daily.    Dispense:  20 capsule    Refill:  0   Patient Instructions    For blood pressure -  Increase amlodipine to total of 7.5mg  per day. (2 of the 2.5 in the morning, 1 at night).  Recheck next week.   Unfortunately it does look like you may have a cellulitis or skin infection on the left leg.  Based on your allergies and that infection we can try Keflex twice per day.  Although that is not a penicillin, there sometimes can be cross reaction with patients that had penicillin allergies.  If you have any new side effects or symptoms with that medication be seen right away.  If any shortness of breath, tightness in the throat or swelling, be seen in the emergency room right away.  Recheck in 1 week, sooner if worse.  Thank you for coming in today.    Cellulitis, Adult  Cellulitis is a skin infection. The infected area is usually warm, red, swollen, and tender. This condition occurs most often in the arms and lower legs. The infection can travel to the muscles, blood, and underlying tissue and become serious. It is very important to get treated for this condition. What are the causes? Cellulitis is caused by bacteria. The bacteria enter through a break in the skin, such as a cut, burn, insect bite, open sore, or crack. What increases the risk? This condition is more likely to occur in people who:  Have a weak body defense system (immune system).  Have open wounds on the skin, such as cuts, burns, bites, and scrapes. Bacteria can enter the body through these open wounds.  Are older than 84 years of age.  Have diabetes.  Have a type of long-lasting (chronic) liver disease (cirrhosis) or kidney disease.  Are obese.  Have a skin condition such as: ? Itchy rash (eczema). ? Slow movement of blood in the veins (venous stasis). ? Fluid  buildup below the skin (edema).  Have had radiation therapy.  Use IV drugs. What are the signs or symptoms? Symptoms of this condition include:  Redness, streaking, or spotting on the skin.  Swollen area of the skin.  Tenderness or pain when an area of the skin is touched.  Warm skin.  A fever.  Chills.  Blisters. How is this diagnosed? This condition is diagnosed based on a medical history and physical exam. You may also have tests, including:  Blood tests.  Imaging tests. How is this treated? Treatment for this condition may include:  Medicines, such as antibiotic medicines or medicines to treat allergies (antihistamines).  Supportive care, such as rest and application of cold or warm cloths (compresses) to the skin.  Hospital care, if the condition is severe. The infection usually starts to get better within 1-2 days of treatment. Follow these instructions at home:  Medicines  Take over-the-counter and prescription medicines only as told by your health care provider.  If you were prescribed an antibiotic medicine, take it as told by your health care provider. Do not stop taking the antibiotic even if you start to feel better. General instructions  Drink enough fluid to keep your urine pale yellow.  Do not touch or rub the infected area.  Raise (elevate) the infected area above the level of your heart while you are sitting or lying down.  Apply warm or cold compresses to the affected area as told by your health care provider.  Keep all follow-up visits as told by your health care provider. This is important. These visits let your health care provider make sure a more serious infection is not developing. Contact a health care provider if:  You have a fever.  Your symptoms do not begin to improve within 1-2 days of starting treatment.  Your bone or joint underneath the infected area becomes painful after the skin has healed.  Your infection returns in the  same area or another area.  You notice a swollen bump in the infected area.  You develop new symptoms.  You have a general ill feeling (malaise) with muscle aches and pains. Get help right away if:  Your symptoms get worse.  You feel very sleepy.  You develop vomiting or diarrhea that persists.  You notice red streaks coming from the infected area.  Your red area gets larger or turns dark in color. These symptoms may represent a serious problem that is an emergency. Do not wait to see if the symptoms will go away. Get medical help right away. Call your local emergency services (911 in the [place] Do not drive yourself to the hospital. Summary  Cellulitis is a skin infection. This condition occurs most often in the arms and lower legs.  Treatment for this condition may include medicines, such as antibiotic medicines or antihistamines.  Take over-the-counter and prescription medicines only as told by your health care provider. If you were prescribed an antibiotic medicine, do not stop taking the antibiotic even if you start to feel better.  Contact a health care provider if your symptoms do not begin to improve within 1-2 days of starting treatment or your symptoms get worse.  Keep all follow-up visits as told by your health care provider. This is important. These visits let your health care provider make sure that a more serious infection is not  developing. This information is not intended to replace advice given to you by your health care provider. Make sure you discuss any questions you have with your health care provider. Document Revised: 01/11/2018 Document Reviewed: 01/11/2018 Elsevier Patient Education  [employer].    If you have lab work done today you will be contacted with your lab results within the next 2 weeks.  If you have not heard from [place] then please contact us. The fastest way to get your results is to register for My Chart.   IF you received an x-ray  today, you will receive an invoice from [facility]. Please contact [facility] at [phone] with questions or concerns regarding your invoice.   IF you received labwork today, you will receive an invoice from [place]. Please contact [facility] at [phone] with questions or concerns regarding your invoice.   Our billing staff will not be able to assist you with questions regarding bills from these companies.  You will be contacted with the lab results as soon as they are available. The fastest way to get your results is to activate your My Chart account. Instructions are located on the last page of this paperwork. If you have not heard from [place] regarding the results in 2 weeks, please contact this office.         Signed, Merri Ray, MD [facility]

## 2020-01-10 NOTE — Patient Instructions (Addendum)
For blood pressure -  Increase amlodipine to total of 7.5mg  per day. (2 of the 2.5 in the morning, 1 at night).  Recheck next week.   Unfortunately it does look like you may have a cellulitis or skin infection on the left leg.  Based on your allergies and that infection we can try Keflex twice per day.  Although that is not a penicillin, there sometimes can be cross reaction with patients that had penicillin allergies.  If you have any new side effects or symptoms with that medication be seen right away.  If any shortness of breath, tightness in the throat or swelling, be seen in the emergency room right away.  Recheck in 1 week, sooner if worse.  Thank you for coming in today.    Cellulitis, Adult  Cellulitis is a skin infection. The infected area is usually warm, red, swollen, and tender. This condition occurs most often in the arms and lower legs. The infection can travel to the muscles, blood, and underlying tissue and become serious. It is very important to get treated for this condition. What are the causes? Cellulitis is caused by bacteria. The bacteria enter through a break in the skin, such as a cut, burn, insect bite, open sore, or crack. What increases the risk? This condition is more likely to occur in people who:  Have a weak body defense system (immune system).  Have open wounds on the skin, such as cuts, burns, bites, and scrapes. Bacteria can enter the body through these open wounds.  Are older than 84 years of age.  Have diabetes.  Have a type of long-lasting (chronic) liver disease (cirrhosis) or kidney disease.  Are obese.  Have a skin condition such as: ? Itchy rash (eczema). ? Slow movement of blood in the veins (venous stasis). ? Fluid buildup below the skin (edema).  Have had radiation therapy.  Use IV drugs. What are the signs or symptoms? Symptoms of this condition include:  Redness, streaking, or spotting on the skin.  Swollen area of the  skin.  Tenderness or pain when an area of the skin is touched.  Warm skin.  A fever.  Chills.  Blisters. How is this diagnosed? This condition is diagnosed based on a medical history and physical exam. You may also have tests, including:  Blood tests.  Imaging tests. How is this treated? Treatment for this condition may include:  Medicines, such as antibiotic medicines or medicines to treat allergies (antihistamines).  Supportive care, such as rest and application of cold or warm cloths (compresses) to the skin.  Hospital care, if the condition is severe. The infection usually starts to get better within 1-2 days of treatment. Follow these instructions at home:  Medicines  Take over-the-counter and prescription medicines only as told by your health care provider.  If you were prescribed an antibiotic medicine, take it as told by your health care provider. Do not stop taking the antibiotic even if you start to feel better. General instructions  Drink enough fluid to keep your urine pale yellow.  Do not touch or rub the infected area.  Raise (elevate) the infected area above the level of your heart while you are sitting or lying down.  Apply warm or cold compresses to the affected area as told by your health care provider.  Keep all follow-up visits as told by your health care provider. This is important. These visits let your health care provider make sure a more serious infection is not  developing. Contact a health care provider if:  You have a fever.  Your symptoms do not begin to improve within 1-2 days of starting treatment.  Your bone or joint underneath the infected area becomes painful after the skin has healed.  Your infection returns in the same area or another area.  You notice a swollen bump in the infected area.  You develop new symptoms.  You have a general ill feeling (malaise) with muscle aches and pains. Get help right away if:  Your  symptoms get worse.  You feel very sleepy.  You develop vomiting or diarrhea that persists.  You notice red streaks coming from the infected area.  Your red area gets larger or turns dark in color. These symptoms may represent a serious problem that is an emergency. Do not wait to see if the symptoms will go away. Get medical help right away. Call your local emergency services (911 in the [place] Do not drive yourself to the hospital. Summary  Cellulitis is a skin infection. This condition occurs most often in the arms and lower legs.  Treatment for this condition may include medicines, such as antibiotic medicines or antihistamines.  Take over-the-counter and prescription medicines only as told by your health care provider. If you were prescribed an antibiotic medicine, do not stop taking the antibiotic even if you start to feel better.  Contact a health care provider if your symptoms do not begin to improve within 1-2 days of starting treatment or your symptoms get worse.  Keep all follow-up visits as told by your health care provider. This is important. These visits let your health care provider make sure that a more serious infection is not developing. This information is not intended to replace advice given to you by your health care provider. Make sure you discuss any questions you have with your health care provider. Document Revised: 01/11/2018 Document Reviewed: 01/11/2018 Elsevier Patient Education  [employer].    If you have lab work done today you will be contacted with your lab results within the next 2 weeks.  If you have not heard from [place] then please contact us. The fastest way to get your results is to register for My Chart.   IF you received an x-ray today, you will receive an invoice from [facility]. Please contact [facility] at [phone] with questions or concerns regarding your invoice.   IF you received labwork today, you will receive  an invoice from [place]. Please contact [facility] at [phone] with questions or concerns regarding your invoice.   Our billing staff will not be able to assist you with questions regarding bills from these companies.  You will be contacted with the lab results as soon as they are available. The fastest way to get your results is to activate your My Chart account. Instructions are located on the last page of this paperwork. If you have not heard from [place] regarding the results in 2 weeks, please contact this office.

## 2020-01-12 ENCOUNTER — Encounter: Payer: Self-pay | Admitting: Family Medicine

## 2020-01-14 ENCOUNTER — Encounter: Payer: Self-pay | Admitting: Registered Nurse

## 2020-01-14 ENCOUNTER — Ambulatory Visit (INDEPENDENT_AMBULATORY_CARE_PROVIDER_SITE_OTHER): Payer: Medicare Other | Admitting: Registered Nurse

## 2020-01-14 ENCOUNTER — Other Ambulatory Visit: Payer: Self-pay

## 2020-01-14 VITALS — BP 166/76 | HR 86 | Temp 98.0°F | Resp 16 | Ht 60.0 in | Wt 121.4 lb

## 2020-01-14 DIAGNOSIS — R350 Frequency of micturition: Secondary | ICD-10-CM

## 2020-01-14 LAB — POCT URINALYSIS DIP (CLINITEK)
Bilirubin, UA: NEGATIVE
Glucose, UA: NEGATIVE mg/dL
Ketones, POC UA: NEGATIVE mg/dL
Nitrite, UA: NEGATIVE
POC PROTEIN,UA: NEGATIVE
Spec Grav, UA: 1.015 (ref 1.010–1.025)
Urobilinogen, UA: 0.2 E.U./dL
pH, UA: 5.5 (ref 5.0–8.0)

## 2020-01-14 MED ORDER — CIPROFLOXACIN HCL 250 MG PO TABS: 250.0000 mg | ORAL_TABLET | Freq: Two times a day (BID) | ORAL | 0 refills | Status: DC

## 2020-01-14 NOTE — Patient Instructions (Signed)
° ° ° °  If you have lab work done today you will be contacted with your lab results within the next 2 weeks.  If you have not heard from us then please contact us. The fastest way to get your results is to register for My Chart. ° ° °IF you received an x-ray today, you will receive an invoice from [facility]. Please contact [facility] at [phone] with questions or concerns regarding your invoice.  ° °IF you received labwork today, you will receive an invoice from [facility]. Please contact [facility] at [phone] with questions or concerns regarding your invoice.  ° °Our billing staff will not be able to assist you with questions regarding bills from these companies. ° °You will be contacted with the lab results as soon as they are available. The fastest way to get your results is to activate your My Chart account. Instructions are located on the last page of this paperwork. If you have not heard from us regarding the results in 2 weeks, please contact this office. °  ° ° ° °

## 2020-01-14 NOTE — Progress Notes (Signed)
Acute Office Visit  Subjective:    Patient ID: Gina Pace, female    DOB: January 01, 1925, 86 y.o.   MRN: [id number]  Chief Complaint  Patient presents with  . Urinary Tract Infection    patient states she thinks she has a urinary tract infection she stated she had an odor and presssure. Also discuss medication prescribed at the last visit Keflex    HPI Patient is in today for urinary pressure, frequency, and odor Ongoing the past 3-4 days. Overall worsening Notes that she did not pick up the keflex - she was told that the dose sent was 5,000 mg rather than 500, and she did not feel comfortable taking that dose.  She does not have flank pain, back pain, fevers, chills, fatigue.  Otherwise, no complaints. Leg has been stable and without change.   Past Medical History:  Diagnosis Date  . Allergy   . Cataract   . GI bleed   . Hypertension 02/2019   Newly diagnosed   . Osteoporosis   . Tuberculosis   . Ulcer of gastric fundus, acute     Past Surgical History:  Procedure Laterality Date  . ABDOMINAL HYSTERECTOMY    . APPENDECTOMY    . Bladder     Bladder tack  . EYE SURGERY    . Rectum     Rectal tear repair    Family History  Problem Relation Age of Onset  . CAD Mother 50  . Heart attack Mother   . CAD Father 41  . Heart attack Father   . Congestive Heart Failure Sister   . CAD Sister        Hx of CABG  . Emphysema Brother     Social History   Socioeconomic History  . Marital status: Widowed    Spouse name: Not on file  . Number of children: 3  . Years of education: Not on file  . Highest education level: Not on file  Occupational History  . Occupation: Clinical cytogeneticist    Comment: Retired   . Occupation: Higher education careers adviser     Comment: Retired   Tobacco Use  . Smoking status: Never Smoker  . Smokeless tobacco: Never Used  Substance and Sexual Activity  . Alcohol use: Yes    Comment: 1 Glass of Scotch a Night  . Drug use: Never  . Sexual  activity: Not Currently  Other Topics Concern  . Not on file  Social History Narrative   Has 3 children 2 daughters and a son, who are very attentive.    Lives alone in a townhouse.    Able to perform all ADL's   Exercises daily   Belongs to social clubs   Does line dancing.    Social Determinants of Health   Financial Resource Strain:   . Difficulty of Paying Living Expenses:   Food Insecurity:   . Worried About Charity fundraiser in the Last Year:   . Arboriculturist in the Last Year:   Transportation Needs:   . Film/video editor (Medical):   Marland Kitchen Lack of Transportation (Non-Medical):   Physical Activity:   . Days of Exercise per Week:   . Minutes of Exercise per Session:   Stress:   . Feeling of Stress :   Social Connections:   . Frequency of Communication with Friends and Family:   . Frequency of Social Gatherings with Friends and Family:   . Attends Religious Services:   .  Active Member of Clubs or Organizations:   . Attends Archivist Meetings:   Marland Kitchen Marital Status:   Intimate Partner Violence:   . Fear of Current or Ex-Partner:   . Emotionally Abused:   Marland Kitchen Physically Abused:   . Sexually Abused:     Outpatient Medications Prior to Visit  Medication Sig Dispense Refill  . Acetaminophen (TYLENOL PO) Take by mouth.    . cephALEXin (KEFLEX) 500 MG capsule Take 1 capsule (500 mg total) by mouth 2 (two) times daily. 20 capsule 0  . doxycycline (VIBRAMYCIN) 50 MG capsule Take 1 capsule (50 mg total) by mouth 2 (two) times daily. 10 capsule 0  . fexofenadine (ALLEGRA) 180 MG tablet Take 180 mg by mouth daily.     No facility-administered medications prior to visit.    Allergies  Allergen Reactions  . Chocolate Anaphylaxis    Migraines   . Prednisone Hypertension  . Shellfish Allergy   . Clindamycin/Lincomycin   . Erythromycin   . Lisinopril     Angioedema   . Penicillins   . Sulfa Antibiotics     Review of Systems  Constitutional: Negative.    HENT: Negative.   Eyes: Negative.   Respiratory: Negative.   Cardiovascular: Negative.   Gastrointestinal: Negative.   Endocrine: Negative.   Genitourinary: Positive for frequency and urgency. Negative for decreased urine volume, difficulty urinating, dyspareunia, dysuria, enuresis, flank pain, genital sores, hematuria, menstrual problem, pelvic pain, vaginal bleeding, vaginal discharge and vaginal pain.  Musculoskeletal: Negative.   Skin: Negative.   Allergic/Immunologic: Negative.   Neurological: Negative.   Hematological: Negative.   Psychiatric/Behavioral: Negative.   All other systems reviewed and are negative.      Objective:    Physical Exam Vitals and nursing note reviewed.  Constitutional:      General: She is not in acute distress.    Appearance: Normal appearance. She is normal weight. She is not ill-appearing, toxic-appearing or diaphoretic.  Cardiovascular:     Rate and Rhythm: Normal rate and regular rhythm.  Skin:    General: Skin is warm and dry.     Capillary Refill: Capillary refill takes 2 to 3 seconds.     Coloration: Skin is not jaundiced or pale.     Findings: No bruising, erythema, lesion or rash.  Neurological:     General: No focal deficit present.     Mental Status: She is alert and oriented to person, place, and time. Mental status is at baseline.     Cranial Nerves: No cranial nerve deficit.  Psychiatric:        Mood and Affect: Mood normal.        Behavior: Behavior normal.        Thought Content: Thought content normal.        Judgment: Judgment normal.     BP (!) 166/76   Pulse 86   Temp 98 F (36.7 C) (Temporal)   Resp 16   Ht 5' (1.524 m)   Wt 121 lb 6.4 oz (55.1 kg)   SpO2 98%   BMI 23.71 kg/m  Wt Readings from Last 3 Encounters:  01/14/20 121 lb 6.4 oz (55.1 kg)  01/10/20 120 lb (54.4 kg)  12/03/19 120 lb 3.2 oz (54.5 kg)    Health Maintenance Due  Topic Date Due  . DEXA SCAN  Never done    There are no preventive  care reminders to display for this patient.   Lab Results  Component Value Date  TSH 3.380 02/14/2019   Lab Results  Component Value Date   WBC 6.9 03/02/2019   HGB 13.3 03/02/2019   HCT 42.9 03/02/2019   MCV 94.9 03/02/2019   PLT 249 03/02/2019   Lab Results  Component Value Date   NA 142 09/26/2019   K 4.5 09/26/2019   CO2 24 09/26/2019   GLUCOSE 86 09/26/2019   BUN 14 09/26/2019   CREATININE 0.88 09/26/2019   BILITOT 0.4 03/02/2019   ALKPHOS 65 03/02/2019   AST 14 (L) 03/02/2019   ALT 8 03/02/2019   PROT 6.5 03/02/2019   ALBUMIN 3.9 03/02/2019   CALCIUM 10.6 (H) 09/26/2019   ANIONGAP 8 03/02/2019   No results found for: CHOL No results found for: HDL No results found for: LDLCALC No results found for: TRIG No results found for: CHOLHDL No results found for: HGBA1C     Assessment & Plan:   Problem List Items Addressed This Visit    None    Visit Diagnoses    Urine frequency    -  Primary   Relevant Medications   ciprofloxacin (CIPRO) 250 MG tablet   Other Relevant Orders   POCT URINALYSIS DIP (CLINITEK) (Completed)   Urine Culture       Meds ordered this encounter  Medications  . ciprofloxacin (CIPRO) 250 MG tablet    Sig: Take 1 tablet (250 mg total) by mouth 2 (two) times daily.    Dispense:  6 tablet    Refill:  0    Order Specific Question:   Supervising Provider    Answer:   Forrest Moron [id number]   PLAN  POCT UA shows significant leuks  cipro 250mg  PO bid for three days  Return precautions reviewed  Patient encouraged to call clinic with any questions, comments, or concerns.   Maximiano Coss, NP

## 2020-01-17 ENCOUNTER — Other Ambulatory Visit: Payer: Self-pay

## 2020-01-17 ENCOUNTER — Encounter: Payer: Self-pay | Admitting: Family Medicine

## 2020-01-17 ENCOUNTER — Ambulatory Visit (INDEPENDENT_AMBULATORY_CARE_PROVIDER_SITE_OTHER): Payer: Medicare Other | Admitting: Family Medicine

## 2020-01-17 VITALS — BP 168/84 | HR 88 | Temp 97.9°F | Ht 60.0 in | Wt 121.6 lb

## 2020-01-17 DIAGNOSIS — I10 Essential (primary) hypertension: Secondary | ICD-10-CM | POA: Diagnosis not present

## 2020-01-17 DIAGNOSIS — L03116 Cellulitis of left lower limb: Secondary | ICD-10-CM

## 2020-01-17 LAB — URINE CULTURE

## 2020-01-17 MED ORDER — CEPHALEXIN 250 MG PO CAPS: 250.0000 mg | ORAL_CAPSULE | Freq: Two times a day (BID) | ORAL | 0 refills | Status: DC

## 2020-01-17 NOTE — Patient Instructions (Addendum)
Try 2 of the 2.5mg  amlodipine in the morning, and 2 at night for improved blood pressure control. If any new side effects on that dose return to current dose and follow up.  Keep a record of your blood pressures outside of the office and bring them to the next office visit.  Start Keflex 1 pill twice per day for skin infection.  As we discussed that is lower dose than what is recommended to treat skin infections, so if that is not improving your infection I would recommend the typical dose.  We can recheck in 5 days but follow-up sooner here or the emergency room if any worsening symptoms or new side effects with that medication.   If you have lab work done today you will be contacted with your lab results within the next 2 weeks.  If you have not heard from [place] then please contact us. The fastest way to get your results is to register for My Chart.   IF you received an x-ray today, you will receive an invoice from [facility] Radiology. Please contact [facility] Radiology at [phone] with questions or concerns regarding your invoice.   IF you received labwork today, you will receive an invoice from [place]. Please contact [facility] at [phone] with questions or concerns regarding your invoice.   Our billing staff will not be able to assist you with questions regarding bills from these companies.  You will be contacted with the lab results as soon as they are available. The fastest way to get your results is to activate your My Chart account. Instructions are located on the last page of this paperwork. If you have not heard from [place] regarding the results in 2 weeks, please contact this office.

## 2020-01-17 NOTE — Progress Notes (Signed)
Subjective:  Patient ID: Gina Pace, female    DOB: [birth year]  Age: 86 y.o. MRN: [id number]  CC:  Chief Complaint  Patient presents with  . Recurrent Skin Infections    3 week follow up for cellulitis on the left lower leg. Area appears to be getting better    HPI Gina Pace presents for   Hypertension Decreased control last visit, increase amlodipine 7.5 mg daily as intolerant to 10 mg in past. Taking 3 of the 2.5mg  per day. (1 this morning, usually 2 at night). No missed doses.  Less headaches.  BP Readings from Last 3 Encounters:  01/17/20 (!) 168/84  01/14/20 (!) 166/76  01/10/20 (!) 171/76  no chest pain, no weakness. No headache.  Very busy day today before office - blames this on elevated readings today. No new lightheadedness.  Home readings - 150/79- not checking at rest.     Cellulitis of left leg. Possible subacute cellulitis versus stasis dermatitis, minimal edema at last visit.  Previous partial treatment with lower than prescribed dose antibiotics, but was intolerant to doxycycline the time with.  Started on Keflex 500 mg twice daily.  Did not start keflex - she called pharmacist after a few days - thought it was 5000mg  and was not comfortable taking that dose.   Still red/swollen but not painful.  Redness for about a month.   eval on 5/11 for UTI cipro 250mg  BID for 3 days. Seemed to help leg. Finished cipro today. Urine cx - p. Mirabilis, intermediate sensitivity to cipro. No more urinary symptoms.    Lab Results  Component Value Date   CREATININE 0.88 09/26/2019   Will not take the 500mg  dose of the keflex, but agrees to take 250mg  dose. understanding expressed of PCN allergy and possible cross reaction, ER precautions if any new side effects and understanding expressed.    History Patient Active Problem List   Diagnosis Date Noted  . Elevated blood pressure reading without diagnosis of hypertension 02/14/2019  . Dizziness and  giddiness 02/14/2019   Past Medical History:  Diagnosis Date  . Allergy   . Cataract   . GI bleed   . Hypertension 02/2019   Newly diagnosed   . Osteoporosis   . Tuberculosis   . Ulcer of gastric fundus, acute    Past Surgical History:  Procedure Laterality Date  . ABDOMINAL HYSTERECTOMY    . APPENDECTOMY    . Bladder     Bladder tack  . EYE SURGERY    . Rectum     Rectal tear repair   Allergies  Allergen Reactions  . Chocolate Anaphylaxis    Migraines   . Prednisone Hypertension  . Shellfish Allergy   . Clindamycin/Lincomycin   . Erythromycin   . Lisinopril     Angioedema   . Penicillins   . Sulfa Antibiotics    Prior to Admission medications   Medication Sig Start Date End Date Taking? Authorizing Provider  Acetaminophen (TYLENOL PO) Take by mouth.   Yes [provider]  ciprofloxacin (CIPRO) 250 MG tablet Take 1 tablet (250 mg total) by mouth 2 (two) times daily. 01/14/20  Yes Maximiano Coss, NP  fexofenadine (ALLEGRA) 180 MG tablet Take 180 mg by mouth daily.   Yes [provider]  amLODipine (NORVASC) 5 MG tablet Take 1 tablet (5 mg total) by mouth 2 (two) times daily. 10/23/19 01/10/20  Lelon Perla, MD   Social History   Socioeconomic History  .  Marital status: Widowed    Spouse name: Not on file  . Number of children: 3  . Years of education: Not on file  . Highest education level: Not on file  Occupational History  . Occupation: Clinical cytogeneticist    Comment: Retired   . Occupation: Higher education careers adviser     Comment: Retired   Tobacco Use  . Smoking status: Never Smoker  . Smokeless tobacco: Never Used  Substance and Sexual Activity  . Alcohol use: Yes    Comment: 1 Glass of Scotch a Night  . Drug use: Never  . Sexual activity: Not Currently  Other Topics Concern  . Not on file  Social History Narrative   Has 3 children 2 daughters and a son, who are very attentive.    Lives alone in a townhouse.    Able to perform all ADL's    Exercises daily   Belongs to social clubs   Does line dancing.    Social Determinants of Health   Financial Resource Strain:   . Difficulty of Paying Living Expenses:   Food Insecurity:   . Worried About Charity fundraiser in the Last Year:   . Arboriculturist in the Last Year:   Transportation Needs:   . Film/video editor (Medical):   Marland Kitchen Lack of Transportation (Non-Medical):   Physical Activity:   . Days of Exercise per Week:   . Minutes of Exercise per Session:   Stress:   . Feeling of Stress :   Social Connections:   . Frequency of Communication with Friends and Family:   . Frequency of Social Gatherings with Friends and Family:   . Attends Religious Services:   . Active Member of Clubs or Organizations:   . Attends Archivist Meetings:   Marland Kitchen Marital Status:   Intimate Partner Violence:   . Fear of Current or Ex-Partner:   . Emotionally Abused:   Marland Kitchen Physically Abused:   . Sexually Abused:     Review of Systems Per HPI   Objective:   Vitals:   01/17/20 1615 01/17/20 1718  BP: (!) 181/94 (!) 168/84  Pulse: 88   Temp: 97.9 F (36.6 C)   TempSrc: Temporal   SpO2: 97%   Weight: 121 lb 9.6 oz (55.2 kg)   Height: 5' (1.524 m)   `   Physical Exam Vitals reviewed.  Constitutional:      Appearance: She is well-developed.  HENT:     Head: Normocephalic and atraumatic.  Eyes:     Conjunctiva/sclera: Conjunctivae normal.     Pupils: Pupils are equal, round, and reactive to light.  Neck:     Vascular: No carotid bruit.  Cardiovascular:     Rate and Rhythm: Normal rate and regular rhythm.     Heart sounds: Normal heart sounds.  Pulmonary:     Effort: Pulmonary effort is normal.     Breath sounds: Normal breath sounds.  Abdominal:     Palpations: Abdomen is soft. There is no pulsatile mass.     Tenderness: There is no abdominal tenderness.  Musculoskeletal:     Comments: See photo, calf nontender.  Erythema with warmth at lower aspect of left  lower leg.  Nontender on anterior aspect.   Skin:    General: Skin is warm and dry.  Neurological:     Mental Status: She is alert and oriented to person, place, and time.  Psychiatric:        Behavior: Behavior  normal.         Assessment & Plan:  Gina Pace is a 86 y.o. female . Cellulitis of leg, left - Plan: cephALEXin (KEFLEX) 250 MG capsule  -Persistent erythema, warmth, still suspected cellulitis versus stasis dermatitis.  Some improvement with Cipro, but not ideal for skin coverage.  Also with potential quinolone risks.  -Recommended she try Keflex as previously prescribed with ER precautions if any new side effects/reactions with reported penicillin allergy.  No known history of anaphylaxis or severe penicillin allergy.  Discussed 500 mg twice daily as typical dosing for cellulitis but she would not like to start that dose initially.  Did agree to start 250 mg for now.  Potential delay in treatment or worsening was discussed.  Understanding expressed.  Recheck 5 days.  Essential hypertension  -Still decreased control.  She will try amlodipine 2.5 mg 2 in the morning, 2 at night for total dose of 10 mg daily.  Recheck 5 days  Meds ordered this encounter  Medications  . cephALEXin (KEFLEX) 250 MG capsule    Sig: Take 1 capsule (250 mg total) by mouth 2 (two) times daily.    Dispense:  20 capsule    Refill:  0   Patient Instructions   Try 2 of the 2.5mg  amlodipine in the morning, and 2 at night for improved blood pressure control. If any new side effects on that dose return to current dose and follow up.  Keep a record of your blood pressures outside of the office and bring them to the next office visit.  Start Keflex 1 pill twice per day for skin infection.  As we discussed that is lower dose than what is recommended to treat skin infections, so if that is not improving your infection I would recommend the typical dose.  We can recheck in 5 days but follow-up sooner  here or the emergency room if any worsening symptoms or new side effects with that medication.   If you have lab work done today you will be contacted with your lab results within the next 2 weeks.  If you have not heard from [place] then please contact us. The fastest way to get your results is to register for My Chart.   IF you received an x-ray today, you will receive an invoice from [facility] Radiology. Please contact [facility] Radiology at [phone] with questions or concerns regarding your invoice.   IF you received labwork today, you will receive an invoice from [place]. Please contact [facility] at [phone] with questions or concerns regarding your invoice.   Our billing staff will not be able to assist you with questions regarding bills from these companies.  You will be contacted with the lab results as soon as they are available. The fastest way to get your results is to activate your My Chart account. Instructions are located on the last page of this paperwork. If you have not heard from [place] regarding the results in 2 weeks, please contact this office.          Signed, Merri Ray, MD [facility]

## 2020-01-17 NOTE — Progress Notes (Signed)
FYI  You had written keflex - she did not take this. I sent cipro, looks like intermediate resistance. I believe you have an appt with her today.  Rich

## 2020-01-18 ENCOUNTER — Encounter: Payer: Self-pay | Admitting: Family Medicine

## 2020-01-20 ENCOUNTER — Emergency Department (HOSPITAL_COMMUNITY): Payer: Medicare Other

## 2020-01-20 ENCOUNTER — Encounter (HOSPITAL_COMMUNITY): Payer: Self-pay | Admitting: Emergency Medicine

## 2020-01-20 ENCOUNTER — Emergency Department (HOSPITAL_COMMUNITY)
Admission: EM | Admit: 2020-01-20 | Discharge: 2020-01-20 | Disposition: A | Discharge status: Home / Self Care | Payer: Medicare Other | Attending: Emergency Medicine | Admitting: Emergency Medicine

## 2020-01-20 ENCOUNTER — Other Ambulatory Visit: Payer: Self-pay

## 2020-01-20 DIAGNOSIS — S0990XA Unspecified injury of head, initial encounter: Secondary | ICD-10-CM | POA: Diagnosis not present

## 2020-01-20 DIAGNOSIS — I10 Essential (primary) hypertension: Secondary | ICD-10-CM | POA: Insufficient documentation

## 2020-01-20 DIAGNOSIS — Z79899 Other long term (current) drug therapy: Secondary | ICD-10-CM | POA: Diagnosis not present

## 2020-01-20 DIAGNOSIS — S0101XA Laceration without foreign body of scalp, initial encounter: Secondary | ICD-10-CM | POA: Insufficient documentation

## 2020-01-20 DIAGNOSIS — S42202A Unspecified fracture of upper end of left humerus, initial encounter for closed fracture: Secondary | ICD-10-CM | POA: Diagnosis not present

## 2020-01-20 DIAGNOSIS — I491 Atrial premature depolarization: Secondary | ICD-10-CM | POA: Diagnosis not present

## 2020-01-20 DIAGNOSIS — Y999 Unspecified external cause status: Secondary | ICD-10-CM | POA: Insufficient documentation

## 2020-01-20 DIAGNOSIS — R519 Headache, unspecified: Secondary | ICD-10-CM | POA: Insufficient documentation

## 2020-01-20 DIAGNOSIS — S199XXA Unspecified injury of neck, initial encounter: Secondary | ICD-10-CM | POA: Diagnosis not present

## 2020-01-20 DIAGNOSIS — Z743 Need for continuous supervision: Secondary | ICD-10-CM | POA: Diagnosis not present

## 2020-01-20 DIAGNOSIS — W010XXA Fall on same level from slipping, tripping and stumbling without subsequent striking against object, initial encounter: Secondary | ICD-10-CM | POA: Insufficient documentation

## 2020-01-20 DIAGNOSIS — M79603 Pain in arm, unspecified: Secondary | ICD-10-CM | POA: Diagnosis not present

## 2020-01-20 DIAGNOSIS — Y929 Unspecified place or not applicable: Secondary | ICD-10-CM | POA: Insufficient documentation

## 2020-01-20 DIAGNOSIS — S42292A Other displaced fracture of upper end of left humerus, initial encounter for closed fracture: Secondary | ICD-10-CM | POA: Diagnosis not present

## 2020-01-20 DIAGNOSIS — S0181XA Laceration without foreign body of other part of head, initial encounter: Secondary | ICD-10-CM | POA: Diagnosis not present

## 2020-01-20 DIAGNOSIS — R0902 Hypoxemia: Secondary | ICD-10-CM | POA: Diagnosis not present

## 2020-01-20 DIAGNOSIS — Y939 Activity, unspecified: Secondary | ICD-10-CM | POA: Diagnosis not present

## 2020-01-20 DIAGNOSIS — M79602 Pain in left arm: Secondary | ICD-10-CM | POA: Diagnosis not present

## 2020-01-20 DIAGNOSIS — S59902A Unspecified injury of left elbow, initial encounter: Secondary | ICD-10-CM | POA: Diagnosis not present

## 2020-01-20 MED ORDER — TRAMADOL HCL 50 MG PO TABS: 50.0000 mg | ORAL_TABLET | Freq: Four times a day (QID) | ORAL | 0 refills | Status: DC | PRN

## 2020-01-20 MED ORDER — LIDOCAINE-EPINEPHRINE (PF) 2 %-1:200000 IJ SOLN: 20.0000 mL | Freq: Once | INTRAMUSCULAR | Status: DC

## 2020-01-20 MED ORDER — ACETAMINOPHEN 500 MG PO TABS
1000.0000 mg | ORAL_TABLET | Freq: Once | ORAL | Status: AC
  Administered 2020-01-20: 1000 mg via ORAL
  Filled 2020-01-20: qty 2

## 2020-01-20 MED ORDER — LIDOCAINE-EPINEPHRINE (PF) 2 %-1:200000 IJ SOLN
20.0000 mL | Freq: Once | INTRAMUSCULAR | Status: DC
  Filled 2020-01-20: qty 20

## 2020-01-20 MED ORDER — MORPHINE SULFATE (PF) 2 MG/ML IV SOLN
2.0000 mg | Freq: Once | INTRAVENOUS | Status: DC
  Filled 2020-01-20: qty 1

## 2020-01-20 MED ORDER — ONDANSETRON HCL 4 MG/2ML IJ SOLN
4.0000 mg | Freq: Once | INTRAMUSCULAR | Status: DC
  Filled 2020-01-20: qty 2

## 2020-01-20 MED ORDER — TETANUS-DIPHTH-ACELL PERTUSSIS 5-2.5-18.5 LF-MCG/0.5 IM SUSP
0.5000 mL | Freq: Once | INTRAMUSCULAR | Status: DC
  Filled 2020-01-20: qty 0.5

## 2020-01-20 NOTE — ED Triage Notes (Signed)
Pt BIB [facility] from, pt had a mechanical fall. EMS reports obvious deformity to left upper arm, and 3cm laceration to forehead. Given 198mcg fentanyl pta.

## 2020-01-20 NOTE — Progress Notes (Signed)
Transition of Care [facility]) - Emergency Department Mini Assessment   Patient Details  Name: Gina Pace MRN: [id number] Date of Birth: 1924-12-16  Transition of Care [facility]) CM/SW Contact:    Vergie Living, LCSW Phone Number: 01/20/2020, 8:53 PM   Clinical Narrative:    ED Mini Assessment: What brought you to the Emergency Department? : Fall onto concrete  Barriers to Discharge: Continued Medical Work up  [employer] interventions: Awaiting orthopedic consult  Means of departure: Not know  Interventions which prevented an admission or readmission: [place] or Services    Patient Contact and Communications        ,                 Admission diagnosis:  Fall,left humerous fracture Patient Active Problem List   Diagnosis Date Noted  . Elevated blood pressure reading without diagnosis of hypertension 02/14/2019  . Dizziness and giddiness 02/14/2019   PCP:  Wendie Agreste, MD Pharmacy:   [id number] [address], [place] [address] Phone: [phone] Fax: [phone]

## 2020-01-20 NOTE — Social Work (Signed)
CSW met with Pt and son at bedside. Pt is A&Ox4 and is very pleasant.  Pt lives alone with no prior [facility], but is amenable to receiving [facility] services during her convalescence.  CSW staffed with [facility] RN Rosendo Gros for follow up.

## 2020-01-20 NOTE — ED Provider Notes (Signed)
[facility] Provider Note   CSN: [id number] Arrival date & time: 01/20/20  1658     History Chief Complaint  Patient presents with  . Fall    Gina Pace is a 86 y.o. female.  Patient s/p mechanical fall, states foot got caught on uneven surface. Fell forward, hit head, laceration to scalp, and c/o left upper arm pain post fall. Pain constant post fall, dull, moderate, non radiating, worse w movement. No faintness or dizziness prior to fall - states felt fine, asymptomatic, before fall. No loc. States dazed, dull pain to head post fall. Last tetanus unknown. Denies neck or back pain. Denies other pain or injury. No cp or discomfort. No sob. No abd pain or nv. No other extremity pain or injury.   The history is provided by the patient and the EMS personnel.  Fall Pertinent negatives include no chest pain and no abdominal pain.       Past Medical History:  Diagnosis Date  . Allergy   . Cataract   . GI bleed   . Hypertension 02/2019   Newly diagnosed   . Osteoporosis   . Tuberculosis   . Ulcer of gastric fundus, acute     Patient Active Problem List   Diagnosis Date Noted  . Elevated blood pressure reading without diagnosis of hypertension 02/14/2019  . Dizziness and giddiness 02/14/2019    Past Surgical History:  Procedure Laterality Date  . ABDOMINAL HYSTERECTOMY    . APPENDECTOMY    . Bladder     Bladder tack  . EYE SURGERY    . Rectum     Rectal tear repair     OB History   No obstetric history on file.     Family History  Problem Relation Age of Onset  . CAD Mother 100  . Heart attack Mother   . CAD Father 24  . Heart attack Father   . Congestive Heart Failure Sister   . CAD Sister        Hx of CABG  . Emphysema Brother     Social History   Tobacco Use  . Smoking status: Never Smoker  . Smokeless tobacco: Never Used  Substance Use Topics  . Alcohol use: Yes    Comment: 1 Glass of Scotch a  Night  . Drug use: Never    Home Medications Prior to Admission medications   Medication Sig Start Date End Date Taking? Authorizing Provider  Acetaminophen (TYLENOL PO) Take by mouth.    [provider]  cephALEXin (KEFLEX) 250 MG capsule Take 1 capsule (250 mg total) by mouth 2 (two) times daily. 01/17/20   Wendie Agreste, MD  fexofenadine (ALLEGRA) 180 MG tablet Take 180 mg by mouth daily.    [provider]  amLODipine (NORVASC) 5 MG tablet Take 1 tablet (5 mg total) by mouth 2 (two) times daily. 10/23/19 01/10/20  Lelon Perla, MD    Allergies    Chocolate, Prednisone, Shellfish allergy, Clindamycin/lincomycin, Erythromycin, Lisinopril, Penicillins, and Sulfa antibiotics  Review of Systems   Review of Systems  Constitutional: Negative for fever.  HENT: Negative for nosebleeds.   Eyes: Negative for pain and visual disturbance.  Respiratory: Negative for cough.   Cardiovascular: Negative for chest pain.  Gastrointestinal: Negative for abdominal pain and vomiting.  Genitourinary: Negative for flank pain.  Musculoskeletal: Negative for back pain and neck pain.  Skin: Positive for wound.  Neurological: Negative for weakness and numbness.  Hematological: Does not bruise/bleed easily.       No anticoag use.   Psychiatric/Behavioral: Negative for confusion.    Physical Exam Updated Vital Signs There were no vitals taken for this visit.  Physical Exam Vitals and nursing note reviewed.  Constitutional:      Appearance: Normal appearance. She is well-developed.  HENT:     Head:     Comments: 4 cm left forehead laceration.     Nose: Nose normal.     Mouth/Throat:     Mouth: Mucous membranes are moist.  Eyes:     General: No scleral icterus.    Conjunctiva/sclera: Conjunctivae normal.     Pupils: Pupils are equal, round, and reactive to light.  Neck:     Trachea: No tracheal deviation.  Cardiovascular:     Rate and Rhythm: Normal rate and regular  rhythm.     Pulses: Normal pulses.     Heart sounds: Normal heart sounds. No murmur. No friction rub. No gallop.   Pulmonary:     Effort: Pulmonary effort is normal. No respiratory distress.     Breath sounds: Normal breath sounds.  Chest:     Chest wall: No tenderness.  Abdominal:     General: Bowel sounds are normal. There is no distension.     Palpations: Abdomen is soft.     Tenderness: There is no abdominal tenderness. There is no guarding.     Comments: No abd bruising or contusion.   Genitourinary:    Comments: No cva tenderness.  Musculoskeletal:        General: No swelling.     Cervical back: Normal range of motion and neck supple. No rigidity. No muscular tenderness.     Comments: Mid cervical tenderness, mild, otherwise, CTLS spine, non tender, aligned, no step off. Tenderness left elbow and upper arm. Compartments of arm soft, not tense. Radial pulse 2+. No other focal bony tenderness noted.    Skin:    General: Skin is warm and dry.     Findings: No rash.  Neurological:     Mental Status: She is alert.     Comments: Alert, speech normal. Motor intact bil, stre 5/5. sens grossly intact bil.   Psychiatric:        Mood and Affect: Mood normal.     ED Results / Procedures / Treatments   Labs (all labs ordered are listed, but only abnormal results are displayed) Results for orders placed or performed in visit on 01/14/20  Urine Culture   Specimen: Urine   UR  Result Value Ref Range   Urine Culture, Routine Final report (A)    Organism ID, Bacteria Proteus mirabilis (A)    Antimicrobial Susceptibility Comment   POCT URINALYSIS DIP (CLINITEK)  Result Value Ref Range   Color, UA yellow yellow   Clarity, UA cloudy (A) clear   Glucose, UA negative negative mg/dL   Bilirubin, UA negative negative   Ketones, POC UA negative negative mg/dL   Spec Grav, UA 1.015 1.010 - 1.025   Blood, UA small (A) negative   pH, UA 5.5 5.0 - 8.0   POC PROTEIN,UA negative negative,  trace   Urobilinogen, UA 0.2 0.2 or 1.0 E.U./dL   Nitrite, UA Negative Negative   Leukocytes, UA Large (3+) (A) Negative   DG ELBOW COMPLETE LEFT (3+VIEW)  Result Date: 01/20/2020 CLINICAL DATA:  Fall, left arm pain EXAM: LEFT ELBOW - COMPLETE 3+ VIEW COMPARISON:  None. FINDINGS: Study is limited by  patient mobility. No visible fracture, subluxation or dislocation. No visible joint effusion although the lateral view is suboptimal. Soft tissues are unremarkable. IMPRESSION: Limited study.  No visible acute bony abnormality. Electronically Signed   By: Rolm Baptise M.D.   On: 01/20/2020 18:49   CT HEAD WO CONTRAST  Result Date: 01/20/2020 CLINICAL DATA:  Fall EXAM: CT HEAD WITHOUT CONTRAST TECHNIQUE: Contiguous axial images were obtained from the base of the skull through the vertex without intravenous contrast. COMPARISON:  02/27/2019 FINDINGS: Brain: Mild age related volume loss. No acute intracranial abnormality. Specifically, no hemorrhage, hydrocephalus, mass lesion, acute infarction, or significant intracranial injury. Vascular: No hyperdense vessel or unexpected calcification. Skull: No acute calvarial abnormality. Sinuses/Orbits: Visualized paranasal sinuses and mastoids clear. Orbital soft tissues unremarkable. Other: None IMPRESSION: No acute intracranial abnormality. Electronically Signed   By: Rolm Baptise M.D.   On: 01/20/2020 19:16   CT CERVICAL SPINE WO CONTRAST  Result Date: 01/20/2020 CLINICAL DATA:  Fall, neck trauma EXAM: CT CERVICAL SPINE WITHOUT CONTRAST TECHNIQUE: Multidetector CT imaging of the cervical spine was performed without intravenous contrast. Multiplanar CT image reconstructions were also generated. COMPARISON:  None. FINDINGS: Alignment: Normal alignment. Skull base and vertebrae: No acute fracture. No primary bone lesion or focal pathologic process. Soft tissues and spinal canal: No prevertebral fluid or swelling. No visible canal hematoma. Disc levels: Mild  degenerative facet disease bilaterally and degenerative disc disease. Upper chest: Biapical scarring. Other: None IMPRESSION: No acute bony abnormality. Electronically Signed   By: Rolm Baptise M.D.   On: 01/20/2020 19:16   DG Humerus Left  Result Date: 01/20/2020 CLINICAL DATA:  Fall, pain EXAM: LEFT HUMERUS - 2+ VIEW COMPARISON:  None. FINDINGS: Comminuted fracture noted through the left humeral neck and proximal left humerus. There is subluxation or dislocation of the glenohumeral joint inferiorly. No other humeral abnormality noted. IMPRESSION: Comminuted, displaced proximal left humeral/bowl neck fracture with inferior subluxation or dislocation. Electronically Signed   By: Rolm Baptise M.D.   On: 01/20/2020 18:50    EKG None  Radiology DG ELBOW COMPLETE LEFT (3+VIEW)  Result Date: 01/20/2020 CLINICAL DATA:  Fall, left arm pain EXAM: LEFT ELBOW - COMPLETE 3+ VIEW COMPARISON:  None. FINDINGS: Study is limited by patient mobility. No visible fracture, subluxation or dislocation. No visible joint effusion although the lateral view is suboptimal. Soft tissues are unremarkable. IMPRESSION: Limited study.  No visible acute bony abnormality. Electronically Signed   By: Rolm Baptise M.D.   On: 01/20/2020 18:49   CT HEAD WO CONTRAST  Result Date: 01/20/2020 CLINICAL DATA:  Fall EXAM: CT HEAD WITHOUT CONTRAST TECHNIQUE: Contiguous axial images were obtained from the base of the skull through the vertex without intravenous contrast. COMPARISON:  02/27/2019 FINDINGS: Brain: Mild age related volume loss. No acute intracranial abnormality. Specifically, no hemorrhage, hydrocephalus, mass lesion, acute infarction, or significant intracranial injury. Vascular: No hyperdense vessel or unexpected calcification. Skull: No acute calvarial abnormality. Sinuses/Orbits: Visualized paranasal sinuses and mastoids clear. Orbital soft tissues unremarkable. Other: None IMPRESSION: No acute intracranial abnormality.  Electronically Signed   By: Rolm Baptise M.D.   On: 01/20/2020 19:16   CT CERVICAL SPINE WO CONTRAST  Result Date: 01/20/2020 CLINICAL DATA:  Fall, neck trauma EXAM: CT CERVICAL SPINE WITHOUT CONTRAST TECHNIQUE: Multidetector CT imaging of the cervical spine was performed without intravenous contrast. Multiplanar CT image reconstructions were also generated. COMPARISON:  None. FINDINGS: Alignment: Normal alignment. Skull base and vertebrae: No acute fracture. No primary bone lesion or focal  pathologic process. Soft tissues and spinal canal: No prevertebral fluid or swelling. No visible canal hematoma. Disc levels: Mild degenerative facet disease bilaterally and degenerative disc disease. Upper chest: Biapical scarring. Other: None IMPRESSION: No acute bony abnormality. Electronically Signed   By: Rolm Baptise M.D.   On: 01/20/2020 19:16   DG Humerus Left  Result Date: 01/20/2020 CLINICAL DATA:  Fall, pain EXAM: LEFT HUMERUS - 2+ VIEW COMPARISON:  None. FINDINGS: Comminuted fracture noted through the left humeral neck and proximal left humerus. There is subluxation or dislocation of the glenohumeral joint inferiorly. No other humeral abnormality noted. IMPRESSION: Comminuted, displaced proximal left humeral/bowl neck fracture with inferior subluxation or dislocation. Electronically Signed   By: Rolm Baptise M.D.   On: 01/20/2020 18:50    Procedures .Marland KitchenLaceration Repair  Date/Time: 01/20/2020 9:00 PM Performed by: Lajean Saver, MD Authorized by: Lajean Saver, MD   Consent:    Consent obtained:  Verbal   Consent given by:  Patient Anesthesia (see MAR for exact dosages):    Anesthesia method:  Local infiltration   Local anesthetic:  Lidocaine 2% WITH epi Laceration details:    Location:  Face   Face location:  Forehead   Length (cm):  4 Repair type:    Repair type:  Simple Pre-procedure details:    Preparation:  Patient was prepped and draped in usual sterile fashion Exploration:     Wound exploration: wound explored through full range of motion and entire depth of wound probed and visualized     Contaminated: no   Treatment:    Area cleansed with:  Saline   Amount of cleaning:  Standard   Irrigation solution:  Sterile saline   Irrigation method:  Syringe   Visualized foreign bodies/material removed: no   Skin repair:    Repair method:  Sutures   Suture size:  6-0   Suture material:  Prolene   Suture technique:  Simple interrupted   Number of sutures:  6 Approximation:    Approximation:  Close Post-procedure details:    Dressing:  Antibiotic ointment and sterile dressing   Patient tolerance of procedure:  Tolerated well, no immediate complications   (including critical care time)  Medications Ordered in ED Medications  Tdap (BOOSTRIX) injection 0.5 mL (0.5 mLs Intramuscular Not Given 01/20/20 1745)  lidocaine-EPINEPHrine (XYLOCAINE W/EPI) 2 %-1:200000 (PF) injection 20 mL (has no administration in time range)    ED Course  I have reviewed the triage vital signs and the nursing notes.  Pertinent labs & imaging results that were available during my care of the patient were reviewed by me and considered in my medical decision making (see chart for details).    MDM Rules/Calculators/A&P                      Imaging ordered.   Tetanus im.  Reviewed nursing notes and prior charts for additional history.   Wound sutured. Bacitracin and sterile dressing.  Recheck CTLS spine, non tender, aligned, no step off. abd soft nt. No new c/o.   Acetaminophen po. Morphine 2 mg iv. zofran iv. Icepack.   Sling by ortho tech.  Recheck LUE - pain controlled. Radial pulse 2+. No numbness/weakness. LUE rad/med/uln fxn, motor/sens intact.   TOC team consulted re home health assistance. They met w pt, and agree w setting up home health. Pt's son w her in ED.   Ortho f/u  Return precautions provided.       Final Clinical Impression(s) /  ED Diagnoses Final  diagnoses:  None    Rx / DC Orders ED Discharge Orders    None       Lajean Saver, MD 01/21/20 1731

## 2020-01-20 NOTE — Progress Notes (Signed)
Orthopedic Tech Progress Note Patient Details:  Gina Pace 1925-05-10 [id number]  Ortho Devices Type of Ortho Device: Sling immobilizer Ortho Device/Splint Location: LUE Ortho Device/Splint Interventions: Application   Post Interventions Patient Tolerated: Well Instructions Provided: Adjustment of device   [name] E [name] 01/20/2020, 9:12 PM

## 2020-01-20 NOTE — ED Notes (Signed)
Pt ambulated appropriately and had no complaints

## 2020-01-20 NOTE — Discharge Instructions (Addendum)
It was our pleasure to provide your ER care today - we hope that you feel better.  Keep wound very clean/dry - have sutures removed, your doctor or urgent care, in 6-8 days.   Icepack/cold to sore area. Wear sling.   Take acetaminophen or ibuprofen as need for pain.  You may also take ultram as need for pain - no driving when taking.   For proximal humerus fracture - follow up with orthopedist in the coming week - call office tomorrow AM to arrange appointment. No driving until cleared to do so by your doctor.   We've made a home health referral - they should be contacting you tomorrow.   Return to ER if worse, new symptoms, intractable pain, numbness/weakness, infection of wound, or other concern.

## 2020-01-22 DIAGNOSIS — M25512 Pain in left shoulder: Secondary | ICD-10-CM | POA: Diagnosis not present

## 2020-01-22 DIAGNOSIS — S42242A 4-part fracture of surgical neck of left humerus, initial encounter for closed fracture: Secondary | ICD-10-CM | POA: Diagnosis not present

## 2020-01-24 ENCOUNTER — Ambulatory Visit: Payer: Medicare Other | Admitting: Family Medicine

## 2020-01-29 ENCOUNTER — Telehealth: Payer: Self-pay | Admitting: Family Medicine

## 2020-01-29 NOTE — Telephone Encounter (Signed)
pts nurse called and is wanting to know if the provider wants this medication to be refilled or if it was a one time use, because pt has one pill left.  cephALEXin (KEFLEX) 250 MG capsule [id number]

## 2020-01-29 NOTE — Telephone Encounter (Signed)
Returned call to patient and informed her to complete taking antibiotic prescription and doctor will re- evaluate need for additional antibiotics at tomorrow's appointment.

## 2020-01-30 ENCOUNTER — Ambulatory Visit (INDEPENDENT_AMBULATORY_CARE_PROVIDER_SITE_OTHER): Payer: Medicare Other | Admitting: Family Medicine

## 2020-01-30 ENCOUNTER — Ambulatory Visit: Payer: Medicare Other | Admitting: Family Medicine

## 2020-01-30 ENCOUNTER — Other Ambulatory Visit: Payer: Self-pay

## 2020-01-30 ENCOUNTER — Encounter: Payer: Self-pay | Admitting: Family Medicine

## 2020-01-30 VITALS — BP 134/70 | HR 97 | Temp 98.0°F | Ht 60.0 in | Wt 120.0 lb

## 2020-01-30 DIAGNOSIS — S0101XD Laceration without foreign body of scalp, subsequent encounter: Secondary | ICD-10-CM | POA: Diagnosis not present

## 2020-01-30 DIAGNOSIS — Y92009 Unspecified place in unspecified non-institutional (private) residence as the place of occurrence of the external cause: Secondary | ICD-10-CM | POA: Diagnosis not present

## 2020-01-30 DIAGNOSIS — L03116 Cellulitis of left lower limb: Secondary | ICD-10-CM | POA: Diagnosis not present

## 2020-01-30 DIAGNOSIS — S42202D Unspecified fracture of upper end of left humerus, subsequent encounter for fracture with routine healing: Secondary | ICD-10-CM | POA: Diagnosis not present

## 2020-01-30 DIAGNOSIS — W19XXXD Unspecified fall, subsequent encounter: Secondary | ICD-10-CM | POA: Diagnosis not present

## 2020-01-30 MED ORDER — CEPHALEXIN 500 MG PO CAPS: 500.0000 mg | ORAL_CAPSULE | Freq: Two times a day (BID) | ORAL | 0 refills | Status: DC

## 2020-01-30 NOTE — Patient Instructions (Addendum)
  Leg appears similar.  Can try higher dosing of Keflex at 500 mg twice per day for now.  If after 1 week of antibiotics that rash is not improved or any residual redness, follow-up to determine next step.  If the redness completely resolves and no further symptoms, no specific follow-up needed.  Wound on scalp appears to be healing well.  Sutures were removed, 2 Steri-Strips were applied, can let this fall off on their own in the next 3 days or remove them at that time.  If any new headaches, lightheadedness, dizziness, blurry vision or other new symptoms be seen right away.  Follow-up with Dr. Onnie Graham as planned.  Let me know if there are other concerns in the meantime.   If you have lab work done today you will be contacted with your lab results within the next 2 weeks.  If you have not heard from [place] then please contact us. The fastest way to get your results is to register for My Chart.   IF you received an x-ray today, you will receive an invoice from [facility]. Please contact [facility] at [phone] with questions or concerns regarding your invoice.   IF you received labwork today, you will receive an invoice from [facility]. Please contact [facility] at [phone] with questions or concerns regarding your invoice.   Our billing staff will not be able to assist you with questions regarding bills from these companies.  You will be contacted with the lab results as soon as they are available. The fastest way to get your results is to activate your My Chart account. Instructions are located on the last page of this paperwork. If you have not heard from [place] regarding the results in 2 weeks, please contact this office.

## 2020-01-30 NOTE — Progress Notes (Signed)
Subjective:  Patient ID: Gina Pace, female    DOB: December 01, 1924  Age: 86 y.o. MRN: [id number]  CC:  Chief Complaint  Patient presents with  . Hospitalization Follow-up    pt fell on 01/20/2020. pt shattered her L shoulder andgot 6 stiches put in her forehead. pt would like to have them removed today. pt has some major edema in her L arm and hand. pt also reports that the sore on her L leg we gave her antibiotics for has improved.    HPI Gina Pace presents for   Hospital follow up: Fall at home on May 17.  Was taking out trash, mechanical fall, no preceding symptoms,, tripped after 2 steps fell onto her left arm, left frontal scalp.  Evaluated through [facility] emergency room.  Diagnosed with a fracture of her left shoulder/left humerus.  And left scalp laceration.  CT head no acute intracranial abnormality, CT cervical spine no acute bony abnormality, left elbow is limited but no apparent fracture or acute abnormality. Left humerus: IMPRESSION: Comminuted, displaced proximal left humeral/bowl neck fracture with inferior subluxation or dislocation. Placed in sling.  She has been evaluated by orthopedics, Dr. Onnie Graham.  Discussion of surgery versus nonsurgical approach with some residual stiffness/mobility issues discussed.  At this point plans on nonoperative treatment, wearing a sling.  Has follow-up in 4 weeks with Ortho.  Has tramadol for pain.  Scalp laceration, repaired with 6 simple interrupted sutures and advised to have those removed in 6 to 8 days.  Denies any headaches, nausea, vomiting, dizziness, vertigo or vision changes.  Left leg cellulitis: See previous notes.  Last treated May 14, she did agree to antibiotics at that time but only at 250 mg of Keflex twice daily.  She feels like it has cleared up and gotten much lighter.  No fevers, no discharge, no wounds.  No side effects with Keflex.     History Patient Active Problem List   Diagnosis Date  Noted  . Elevated blood pressure reading without diagnosis of hypertension 02/14/2019  . Dizziness and giddiness 02/14/2019   Past Medical History:  Diagnosis Date  . Allergy   . Cataract   . GI bleed   . Hypertension 02/2019   Newly diagnosed   . Osteoporosis   . Tuberculosis   . Ulcer of gastric fundus, acute    Past Surgical History:  Procedure Laterality Date  . ABDOMINAL HYSTERECTOMY    . APPENDECTOMY    . Bladder     Bladder tack  . EYE SURGERY    . Rectum     Rectal tear repair   Allergies  Allergen Reactions  . Chocolate Anaphylaxis    Migraines   . Prednisone Hypertension  . Shellfish Allergy   . Clindamycin/Lincomycin   . Erythromycin   . Lisinopril     Angioedema   . Penicillins   . Sulfa Antibiotics    Prior to Admission medications   Medication Sig Start Date End Date Taking? Authorizing Provider  amLODipine (NORVASC) 2.5 MG tablet Take 2.5 mg by mouth 2 (two) times daily. 12/21/19  Yes [provider]  traMADol (ULTRAM) 50 MG tablet Take 1 tablet (50 mg total) by mouth every 6 (six) hours as needed. 01/20/20  Yes Lajean Saver, MD  cephALEXin (KEFLEX) 500 MG capsule Take 1 capsule (500 mg total) by mouth 2 (two) times daily. 01/30/20   Wendie Agreste, MD   Social History   Socioeconomic History  . Marital status:  Widowed    Spouse name: Not on file  . Number of children: 3  . Years of education: Not on file  . Highest education level: Not on file  Occupational History  . Occupation: Clinical cytogeneticist    Comment: Retired   . Occupation: Higher education careers adviser     Comment: Retired   Tobacco Use  . Smoking status: Never Smoker  . Smokeless tobacco: Never Used  Substance and Sexual Activity  . Alcohol use: Yes    Comment: 1 Glass of Scotch a Night  . Drug use: Never  . Sexual activity: Not Currently  Other Topics Concern  . Not on file  Social History Narrative   Has 3 children 2 daughters and a son, who are very attentive.    Lives  alone in a townhouse.    Able to perform all ADL's   Exercises daily   Belongs to social clubs   Does line dancing.    Social Determinants of Health   Financial Resource Strain:   . Difficulty of Paying Living Expenses:   Food Insecurity:   . Worried About Charity fundraiser in the Last Year:   . Arboriculturist in the Last Year:   Transportation Needs:   . Film/video editor (Medical):   Marland Kitchen Lack of Transportation (Non-Medical):   Physical Activity:   . Days of Exercise per Week:   . Minutes of Exercise per Session:   Stress:   . Feeling of Stress :   Social Connections:   . Frequency of Communication with Friends and Family:   . Frequency of Social Gatherings with Friends and Family:   . Attends Religious Services:   . Active Member of Clubs or Organizations:   . Attends Archivist Meetings:   Marland Kitchen Marital Status:   Intimate Partner Violence:   . Fear of Current or Ex-Partner:   . Emotionally Abused:   Marland Kitchen Physically Abused:   . Sexually Abused:     Review of Systems Per HPI  Objective:   Vitals:   01/30/20 1543 01/30/20 1544  BP: (!) 144/70 134/70  Pulse: 97   Temp: 98 F (36.7 C)   TempSrc: Temporal   SpO2: 95%   Weight: 120 lb (54.4 kg)   Height: 5' (1.524 m)      Physical Exam Constitutional:      General: She is not in acute distress.    Appearance: She is well-developed.  HENT:     Head: Normocephalic and atraumatic.   Cardiovascular:     Rate and Rhythm: Normal rate.  Pulmonary:     Effort: Pulmonary effort is normal.  Musculoskeletal:     Comments: Left arm held in sling, diffuse ecchymosis throughout left arm with soft tissue swelling to the hand.  Neurovascular intact distally with cap refill less than 1 second at fingertips.  Left leg Faint erythema from 2 cm below tibial tuberosity to the anterior aspect of lower leg toward the ankle with chronic skin change medial lower aspect.  No open wounds.  No appreciable swelling.   Slight warmth to erythematous area.   Neurological:     Mental Status: She is alert and oriented to person, place, and time.        Assessment & Plan:  Gina Pace is a 86 y.o. female . Cellulitis of leg, left - Plan: cephALEXin (KEFLEX) 500 MG capsule  -Although possible lightening of erythema, still appears to have component of cellulitis.  Low-dose  of Keflex used previously.  Did agree to try higher dose.  Trial of Keflex 500 mg twice daily for 1 week, follow-up if erythema not resolved, sooner if worse.  May need vascular eval versus dermatology.  Laceration of scalp without foreign body, subsequent encounter  -Sutures removed without difficulty, 2 Steri-Strips placed with aftercare and RTC precautions given.  Denies headache or other head injury symptoms but ER/RTC precautions given with understanding expressed  Fall in home, subsequent encounter Closed fracture of proximal end of left humerus with routine healing, unspecified fracture morphology, subsequent encounter  -Has ongoing follow-up in place with orthopedist.  Denies acute needs at this time.  Meds ordered this encounter  Medications  . cephALEXin (KEFLEX) 500 MG capsule    Sig: Take 1 capsule (500 mg total) by mouth 2 (two) times daily.    Dispense:  14 capsule    Refill:  0   Patient Instructions    Leg appears similar.  Can try higher dosing of Keflex at 500 mg twice per day for now.  If after 1 week of antibiotics that rash is not improved or any residual redness, follow-up to determine next step.  If the redness completely resolves and no further symptoms, no specific follow-up needed.  Wound on scalp appears to be healing well.  Sutures were removed, 2 Steri-Strips were applied, can let this fall off on their own in the next 3 days or remove them at that time.  If any new headaches, lightheadedness, dizziness, blurry vision or other new symptoms be seen right away.  Follow-up with Dr. Onnie Graham as planned.   Let me know if there are other concerns in the meantime.   If you have lab work done today you will be contacted with your lab results within the next 2 weeks.  If you have not heard from [place] then please contact us. The fastest way to get your results is to register for My Chart.   IF you received an x-ray today, you will receive an invoice from [facility]. Please contact [facility] at [phone] with questions or concerns regarding your invoice.   IF you received labwork today, you will receive an invoice from [facility]. Please contact [facility] at [phone] with questions or concerns regarding your invoice.   Our billing staff will not be able to assist you with questions regarding bills from these companies.  You will be contacted with the lab results as soon as they are available. The fastest way to get your results is to activate your My Chart account. Instructions are located on the last page of this paperwork. If you have not heard from [place] regarding the results in 2 weeks, please contact this office.         Signed, Merri Ray, MD [facility]

## 2020-02-04 MED ORDER — CEPHALEXIN 250 MG PO CAPS: 500.0000 mg | ORAL_CAPSULE | Freq: Two times a day (BID) | ORAL | 0 refills | Status: DC

## 2020-02-04 NOTE — Telephone Encounter (Signed)
Pt agreed to the 500 mg but wants it in 250mg  tablets so that if the 500 mg dose does not agree with her then she can take the 250 instead and doesn't have to wait. I told her I would request this from you.

## 2020-02-04 NOTE — Addendum Note (Signed)
Addended by: Merri Ray R on: 02/04/2020 12:11 PM   Modules accepted: Orders

## 2020-02-04 NOTE — Telephone Encounter (Signed)
Patient called in stating she would like the cephALEXin (KEFLEX) 500 MG capsule medication in 250 instead of 500mg . Please advise.

## 2020-02-04 NOTE — Telephone Encounter (Signed)
I suspect the 500mg  would also agree with her. Keflex dosing for skin infections is 500mg  every 6 to 12 hours. We tried 250mg  twice per day at her initial request, but did not significantly improve her leg cellulitis. 500mg  twice per day is still on lower dose of recommendation. I would recommend taking 500mg  as prescribed, or we can certainly refer her to dermatology to evaluate that area further/2nd opinion but not sure how quickly she can be seen. I am just concerned that the 250mg  did not help significantly. Let me know what she would like to do.

## 2020-02-04 NOTE — Telephone Encounter (Signed)
Will order the 250mg  but should advise [place] right away if there is any difficulty with 500mg  dose as may need to see specialiist at that point.

## 2020-02-04 NOTE — Telephone Encounter (Signed)
Spoke with pt and her daughter and they are wanting to stick with 250mg  instead of 500mg   cephalexin per she is a low dose person when it comes to medications and she feels that since the 250 is agreeing with her then they would like to stay with the 250.    Please Advise.

## 2020-02-04 NOTE — Telephone Encounter (Signed)
Pt is aware she needs to call if she modifies dose in any way

## 2020-02-07 ENCOUNTER — Telehealth: Payer: Self-pay | Admitting: Family Medicine

## 2020-02-07 ENCOUNTER — Telehealth: Payer: Self-pay | Admitting: Surgery

## 2020-02-07 NOTE — Telephone Encounter (Signed)
Called pt back and she is having right leg swelling. It started to swell on Wednesday and yesterday the swelling got worse. Today it is doing better with her leg elevated.   Pt has an appointment on Monday at 10:40am.   Please advise on recommendation. Pt was last seen on 01/30/20.

## 2020-02-07 NOTE — Telephone Encounter (Signed)
Pt daughter called and is wanting a nurse to give the pt a call regarding her R leg is swelling states that he doesn't hurt just swelling. Pt daughter called stating she feel and shattered her L shoulder and is wondering if that is a factor. Best number to get intouch with pt is. [phone] Please advise.

## 2020-02-07 NOTE — Telephone Encounter (Signed)
Received a call from patient concerning [facility] services which she has not come out yet to see her.  Patient reports she spoke with her PCP's office who made her aware that [facility] was not accepting new patients.  CM explained that she would need to contact her PCP send a referral to another [facility] agency. Patient verbalized understanding teach back done.

## 2020-02-07 NOTE — Telephone Encounter (Signed)
Called pt.  R leg swelling past few days. Minimal initially, then worse for 1 days. Back to normal this morning. Minimal swelling now. Better with leg elevation.  No hx of DVT. No calf pain/cp/dyspnea. Better today.   In office eval in 3 days. ER/urgent care if calf pain or worsening swelling over the weekend. understanding expressed.

## 2020-02-10 ENCOUNTER — Other Ambulatory Visit: Payer: Self-pay | Admitting: Family Medicine

## 2020-02-10 ENCOUNTER — Ambulatory Visit (INDEPENDENT_AMBULATORY_CARE_PROVIDER_SITE_OTHER): Payer: Medicare Other | Admitting: Family Medicine

## 2020-02-10 ENCOUNTER — Other Ambulatory Visit: Payer: Self-pay

## 2020-02-10 ENCOUNTER — Encounter: Payer: Self-pay | Admitting: Family Medicine

## 2020-02-10 VITALS — BP 158/74 | HR 97 | Temp 98.4°F | Ht 60.0 in | Wt 116.0 lb

## 2020-02-10 DIAGNOSIS — L03116 Cellulitis of left lower limb: Secondary | ICD-10-CM

## 2020-02-10 DIAGNOSIS — S42201D Unspecified fracture of upper end of right humerus, subsequent encounter for fracture with routine healing: Secondary | ICD-10-CM

## 2020-02-10 DIAGNOSIS — M7989 Other specified soft tissue disorders: Secondary | ICD-10-CM

## 2020-02-10 DIAGNOSIS — S42202D Unspecified fracture of upper end of left humerus, subsequent encounter for fracture with routine healing: Secondary | ICD-10-CM | POA: Diagnosis not present

## 2020-02-10 NOTE — Progress Notes (Signed)
Subjective:  Patient ID: Gina Pace, female    DOB: 01-31-1925  Age: 86 y.o. MRN: [id number]  CC:  Chief Complaint  Patient presents with  . Edema    in R leg and ankel. pt reports no pain in the area. swelling started 1 week ago. pt also reports having cellulitis in the L knee and pt reports no pain in that area started 1 month ago.  . Follow-up    on shatered shoulder from resent fall. pt reports no improvment.    HPI Gina Pace presents for   Multiple concerns above  Left leg cellulitis:  See previous visits.  Initially prescribed cephalexin on May 7 for suspected left lower extremity cellulitis.  500 mg twice daily prescription given she did not take as thought the dose was 5000 mg and too high.  Still swollen and redness for about a month when discussed at May 14 visit.  Had reported some temporary improvement when she had been on Cipro 250 mg twice daily for 3 days for UTI.  She refused 500 mg dosing but ultimately did agree to try 250 mg Keflex twice daily on the 14th -10-day supply Leg appears similar on May 27.  Possible lightening of erythema but still appeared to have significant cellulitis.  Tolerating Keflex without reaction.  Due to persistent erythema, recommended higher dose of Keflex at typical dosing for cellulitis.  500 mg twice daily.  See phone messages, agreed on  250 mg pills but to be taking 2 twice per day. Had not started taking keflex until 02/04/20.  Taking keflex - taking 1 in am, 2 in afternoon and 1 at night. No fever.   Right leg swelling: See phone note June 4.  I called her that evening, minimal swelling at that time and was improved with leg elevation. Noticed about a week ago. No pain, just swelling. No prior DVT, no chest pains, no shortness of breath. Still some swelling. Improves overnight, with elevation. Less with    Fall, left humeral fracture.: Fall at home May 17 when she was taking out trash, mechanical fall.  Fell onto left  arm, left frontal scalp.  Suture removal of scalp at her May 27 visit.  Comminuted displaced proximal left humeral bowl/neck fracture with inferior subluxation.  Has been evaluated by orthopedics, Dr. Onnie Graham. appt in 9 days.   Initial planned nonoperative treatment, tramadol for pain. Here with daughter, some concerns about her lack of mobility.  More pain in shoulder recently. Taking tramadol more often past few days - every 6 hours. Lightheaded with pain pill - walking less. Feels drowsy. No hip/knee leg pains, just woozy with pain pills. No further falls. Still wearing sling.  Tylenol did not help.   dtr or son at home with her during the day.   [facility] pending for home assistant.   History Patient Active Problem List   Diagnosis Date Noted  . Elevated blood pressure reading without diagnosis of hypertension 02/14/2019  . Dizziness and giddiness 02/14/2019   Past Medical History:  Diagnosis Date  . Allergy   . Cataract   . GI bleed   . Hypertension 02/2019   Newly diagnosed   . Osteoporosis   . Tuberculosis   . Ulcer of gastric fundus, acute    Past Surgical History:  Procedure Laterality Date  . ABDOMINAL HYSTERECTOMY    . APPENDECTOMY    . Bladder     Bladder tack  . EYE SURGERY    .  Rectum     Rectal tear repair   Allergies  Allergen Reactions  . Chocolate Anaphylaxis    Migraines   . Prednisone Hypertension  . Shellfish Allergy   . Clindamycin/Lincomycin   . Erythromycin   . Lisinopril     Angioedema   . Penicillins   . Sulfa Antibiotics    Prior to Admission medications   Medication Sig Start Date End Date Taking? Authorizing Provider  amLODipine (NORVASC) 2.5 MG tablet Take 2.5 mg by mouth 2 (two) times daily. 12/21/19  Yes [provider]  amLODipine (NORVASC) 5 MG tablet Take 5 mg by mouth 2 (two) times daily. 01/29/20  Yes [provider]  cephALEXin (KEFLEX) 250 MG capsule Take 2 capsules (500 mg total) by mouth 2 (two)  times daily. 02/04/20  Yes Wendie Agreste, MD  traMADol (ULTRAM) 50 MG tablet Take 1 tablet (50 mg total) by mouth every 6 (six) hours as needed. 01/20/20  Yes Lajean Saver, MD   Social History   Socioeconomic History  . Marital status: Widowed    Spouse name: Not on file  . Number of children: 3  . Years of education: Not on file  . Highest education level: Not on file  Occupational History  . Occupation: Clinical cytogeneticist    Comment: Retired   . Occupation: Higher education careers adviser     Comment: Retired   Tobacco Use  . Smoking status: Never Smoker  . Smokeless tobacco: Never Used  Substance and Sexual Activity  . Alcohol use: Yes    Comment: 1 Glass of Scotch a Night  . Drug use: Never  . Sexual activity: Not Currently  Other Topics Concern  . Not on file  Social History Narrative   Has 3 children 2 daughters and a son, who are very attentive.    Lives alone in a townhouse.    Able to perform all ADL's   Exercises daily   Belongs to social clubs   Does line dancing.    Social Determinants of Health   Financial Resource Strain:   . Difficulty of Paying Living Expenses:   Food Insecurity:   . Worried About Charity fundraiser in the Last Year:   . Arboriculturist in the Last Year:   Transportation Needs:   . Film/video editor (Medical):   Marland Kitchen Lack of Transportation (Non-Medical):   Physical Activity:   . Days of Exercise per Week:   . Minutes of Exercise per Session:   Stress:   . Feeling of Stress :   Social Connections:   . Frequency of Communication with Friends and Family:   . Frequency of Social Gatherings with Friends and Family:   . Attends Religious Services:   . Active Member of Clubs or Organizations:   . Attends Archivist Meetings:   Marland Kitchen Marital Status:   Intimate Partner Violence:   . Fear of Current or Ex-Partner:   . Emotionally Abused:   Marland Kitchen Physically Abused:   . Sexually Abused:     Review of Systems   Objective:   Vitals:    02/10/20 1055  BP: (!) 158/74  Pulse: 97  Temp: 98.4 F (36.9 C)  TempSrc: Temporal  SpO2: 97%  Weight: 116 lb (52.6 kg)  Height: 5' (1.524 m)     Physical Exam Constitutional:      General: She is not in acute distress.    Appearance: She is well-developed.  HENT:     Head:  Normocephalic and atraumatic.  Cardiovascular:     Rate and Rhythm: Normal rate.  Pulmonary:     Effort: Pulmonary effort is normal.  Musculoskeletal:     Comments: R calf swelling - 32cm vs 30cm on left at 15cm below patella. Nontender, negative homans.   L leg- no wounds, slight improved erythema. Nontender.   Neurological:     Mental Status: She is alert and oriented to person, place, and time.       Assessment & Plan:  Gina Pace is a 86 y.o. female . Right leg swelling  -Right lower extremity swelling without specific calf pain but does have risk factor for DVT with recent fracture, some increased immobility from that injury.  Check ultrasound.  Closed fracture of proximal end of left humerus with routine healing, unspecified fracture morphology, subsequent encounter  -Followed by orthopedics.  Unfortunately some side effects with tramadol but has needed for pain control.  Planned home PT/nursing eval by the report from orthopedics, but if referral needed for me, I can also send one for home health PT eval, safety eval, nursing assistance if needed.  Follow-up with orthopedic as planned.  Cellulitis of leg, left  -Slight improvement.  Stressed importance of medication adherence including dosing instructions.  Risks of nonadherence to regimen were discussed including progression of infection.  Understanding expressed.  Will change to 500 mg twice daily, follow-up within 1 to 2 days at completion of antibiotic, sooner if worse.  No orders of the defined types were placed in this encounter.  Patient Instructions    I do recommend follow-up with [facility] or with the orthopedic  office for the home assistant as well as potentially assessing safety at home and physical therapy at home if needed.  Let me know if I can help further.  Tylenol is fine for milder pain, be careful with taking the tramadol due to potential weakness and risk of falling.  Range of motion and leg exercises in bed or when seated can also be helpful to keep your legs moving.  I will check an ultrasound today to evaluate for possible blood clot in the right leg.  Please change cephalexin back to prescribed dose of 500 mg in the morning, 500 mg at night, and follow-up with me within a day or 2 of that medicine running out, sooner if any increasing redness.  Ultrasound Appointment is on 02/12/2020 at 2 PM with [facility]. Please arrive at 1:40 to properly be checked in. The address is [address] on [address] floor   If you have lab work done today you will be contacted with your lab results within the next 2 weeks.  If you have not heard from [place] then please contact us. The fastest way to get your results is to register for My Chart.   IF you received an x-ray today, you will receive an invoice from [facility]. Please contact [facility] at [phone] with questions or concerns regarding your invoice.   IF you received labwork today, you will receive an invoice from [facility]. Please contact [facility] at [phone] with questions or concerns regarding your invoice.   Our billing staff will not be able to assist you with questions regarding bills from these companies.  You will be contacted with the lab results as soon as they are available. The fastest way to get your results is to activate your My Chart account. Instructions are located on the last page  of this paperwork. If you have not heard from [place] regarding the results in 2 weeks, please contact this office.         Signed, Merri Ray, MD [facility]

## 2020-02-10 NOTE — Patient Instructions (Addendum)
  I do recommend follow-up with [facility] or with the orthopedic office for the home assistant as well as potentially assessing safety at home and physical therapy at home if needed.  Let me know if I can help further.  Tylenol is fine for milder pain, be careful with taking the tramadol due to potential weakness and risk of falling.  Range of motion and leg exercises in bed or when seated can also be helpful to keep your legs moving.  I will check an ultrasound today to evaluate for possible blood clot in the right leg.  Please change cephalexin back to prescribed dose of 500 mg in the morning, 500 mg at night, and follow-up with me within a day or 2 of that medicine running out, sooner if any increasing redness.  Ultrasound Appointment is on 02/12/2020 at 2 PM with [facility]. Please arrive at 1:40 to properly be checked in. The address is [address] on [address] first floor   If you have lab work done today you will be contacted with your lab results within the next 2 weeks.  If you have not heard from [place] then please contact us. The fastest way to get your results is to register for My Chart.   IF you received an x-ray today, you will receive an invoice from [facility]. Please contact [facility] at [phone] with questions or concerns regarding your invoice.   IF you received labwork today, you will receive an invoice from [facility]. Please contact [facility] at [phone] with questions or concerns regarding your invoice.   Our billing staff will not be able to assist you with questions regarding bills from these companies.  You will be contacted with the lab results as soon as they are available. The fastest way to get your results is to activate your My Chart account. Instructions are located on the last page of this paperwork. If you have not heard from [place] regarding the results in 2 weeks, please contact this office.

## 2020-02-11 ENCOUNTER — Encounter: Payer: Self-pay | Admitting: Family Medicine

## 2020-02-12 ENCOUNTER — Ambulatory Visit
Admission: RE | Admit: 2020-02-12 | Discharge: 2020-02-12 | Disposition: A | Discharge status: Home / Self Care | Payer: Medicare Other | Source: Ambulatory Visit | Attending: Family Medicine | Admitting: Family Medicine

## 2020-02-12 DIAGNOSIS — M7989 Other specified soft tissue disorders: Secondary | ICD-10-CM

## 2020-02-12 DIAGNOSIS — R600 Localized edema: Secondary | ICD-10-CM | POA: Diagnosis not present

## 2020-02-12 DIAGNOSIS — S42201D Unspecified fracture of upper end of right humerus, subsequent encounter for fracture with routine healing: Secondary | ICD-10-CM

## 2020-02-13 ENCOUNTER — Telehealth: Payer: Self-pay | Admitting: *Deleted

## 2020-02-13 NOTE — Telephone Encounter (Signed)
Pt called regarding being denied by several home health agencies.  RNCM advised her to call home health agency to see who is in network that can provide home health services to avoid multiple calls,  Pt appreciative of information.

## 2020-02-16 ENCOUNTER — Other Ambulatory Visit: Payer: Self-pay | Admitting: Family Medicine

## 2020-02-16 NOTE — Telephone Encounter (Signed)
Requested medication (s) are due for refill today: no  Requested medication (s) are on the active medication list: yes  Last refill:  02/04/2020  Future visit scheduled: no  Notes to clinic:  medication not assigned to a protocol, review manually   Requested Prescriptions  Pending Prescriptions Disp Refills   cephALEXin (KEFLEX) 250 MG capsule [Pharmacy Med Name: CEPHALEXIN 250 MG CAPSULE] 28 capsule 0    Sig: Take 2 capsules (500 mg total) by mouth 2 (two) times daily.      Off-Protocol Failed - 02/16/2020 10:17 AM      Failed - Medication not assigned to a protocol, review manually.      Passed - Valid encounter within last 12 months    Recent Outpatient Visits           6 days ago Right leg swelling   [facility], Ranell Patrick, MD   2 weeks ago Cellulitis of leg, left   [facility], Ranell Patrick, MD   1 month ago Cellulitis of leg, left   [facility], Ranell Patrick, MD   1 month ago Urine frequency   [facility], [place], NP   1 month ago Cellulitis of leg, left   [facility], Ranell Patrick, MD

## 2020-02-17 NOTE — Telephone Encounter (Signed)
Dr Carlota Raspberry this patient is wanting  refill on the abx Cephalaxin. She was seen on 02/04/20 and told to f/u within the the next day or two or sooner if no better.  She have two pills let and need a refill. I was not sure if you needed to see her first if no better or we can just refill the abx.

## 2020-02-18 NOTE — Telephone Encounter (Signed)
See plan at last visit. I wanted her to complete antibiotic course and then office visit within a few days of completion of that antibiotic. She can be seen sooner if not improving or worse appearance.

## 2020-02-19 DIAGNOSIS — S42242D 4-part fracture of surgical neck of left humerus, subsequent encounter for fracture with routine healing: Secondary | ICD-10-CM | POA: Diagnosis not present

## 2020-02-19 DIAGNOSIS — S42202A Unspecified fracture of upper end of left humerus, initial encounter for closed fracture: Secondary | ICD-10-CM | POA: Insufficient documentation

## 2020-03-10 ENCOUNTER — Other Ambulatory Visit: Payer: Self-pay | Admitting: Family Medicine

## 2020-03-10 DIAGNOSIS — I10 Essential (primary) hypertension: Secondary | ICD-10-CM

## 2020-03-10 NOTE — Telephone Encounter (Signed)
Requested medication (s) are due for refill today: no  Requested medication (s) are on the active medication list:  yes  Last refill:  01/29/2020  Future visit scheduled: yes  Notes to clinic:  last filled by a historical provider  Review for refill   Requested Prescriptions  Pending Prescriptions Disp Refills   amLODipine (NORVASC) 2.5 MG tablet [Pharmacy Med Name: AMLODIPINE BESYLATE 2.5 MG TAB] 90 tablet 3    Sig: TAKE 1 TABLET BY MOUTH TWICE A DAY      Cardiovascular:  Calcium Channel Blockers Failed - 03/10/2020  2:10 AM      Failed - Last BP in normal range    BP Readings from Last 1 Encounters:  02/10/20 (!) 158/74          Passed - Valid encounter within last 6 months    Recent Outpatient Visits           4 weeks ago Right leg swelling   [facility], Ranell Patrick, MD   1 month ago Cellulitis of leg, left   [facility], Ranell Patrick, MD   1 month ago Cellulitis of leg, left   [facility] [place], MD   1 month ago Urine frequency   [facility], [place], NP   2 months ago Cellulitis of leg, left   [facility], Ranell Patrick, MD       Future Appointments             In 1 week Carlota Raspberry Ranell Patrick, MD [facility] [place], [facility]

## 2020-03-18 ENCOUNTER — Ambulatory Visit: Payer: Medicare Other | Admitting: Family Medicine

## 2020-03-18 DIAGNOSIS — S42242D 4-part fracture of surgical neck of left humerus, subsequent encounter for fracture with routine healing: Secondary | ICD-10-CM | POA: Diagnosis not present

## 2020-03-25 ENCOUNTER — Encounter: Payer: Self-pay | Admitting: Family Medicine

## 2020-03-25 ENCOUNTER — Ambulatory Visit (INDEPENDENT_AMBULATORY_CARE_PROVIDER_SITE_OTHER): Payer: Medicare Other | Admitting: Family Medicine

## 2020-03-25 ENCOUNTER — Ambulatory Visit: Payer: Medicare Other | Admitting: Registered Nurse

## 2020-03-25 ENCOUNTER — Other Ambulatory Visit: Payer: Self-pay

## 2020-03-25 VITALS — BP 167/64 | HR 63 | Temp 98.3°F | Ht 60.0 in | Wt 117.6 lb

## 2020-03-25 DIAGNOSIS — M7989 Other specified soft tissue disorders: Secondary | ICD-10-CM

## 2020-03-25 DIAGNOSIS — L03116 Cellulitis of left lower limb: Secondary | ICD-10-CM

## 2020-03-25 DIAGNOSIS — I10 Essential (primary) hypertension: Secondary | ICD-10-CM | POA: Diagnosis not present

## 2020-03-25 MED ORDER — HYDROCHLOROTHIAZIDE 12.5 MG PO TABS: 6.2500 mg | ORAL_TABLET | Freq: Every day | ORAL | 1 refills | Status: DC

## 2020-03-25 MED ORDER — CEPHALEXIN 500 MG PO CAPS: 500.0000 mg | ORAL_CAPSULE | Freq: Two times a day (BID) | ORAL | 0 refills | Status: DC

## 2020-03-25 NOTE — Progress Notes (Signed)
Subjective:  Patient ID: Gina Pace, female    DOB: 05-Sep-1925  Age: 86 y.o. MRN: [id number]  CC:  Chief Complaint  Patient presents with  . Follow-up    [facility]. Pt stated that she feels like it is coming back bc of the redness in her legs.    HPI Gina Pace presents for   Leg redness: See previous evaluations since March.  Subacute cellulitis versus stasis dermatitis.  Initially seen March 30 by Philis Kendall.  Treated with doxycycline for cellulitis, ultrasound negative for DVT at that time.  Some difficulty tolerating medications, and has changed dosages, partial treatments with antibiotic previously.  May 7 prescribed Keflex 500 mg twice daily, she had some concerns with medications and not started them initially.  Ultimately after our discussions she did not feel comfortable with starting a lower dose of Keflex, 250 mg twice daily starting May 14.    Seen May 27 for hospital follow-up.  At that time was tolerating Keflex, but still appeared to have a component of cellulitis.  Concerned that the low-dose of Keflex was not sufficient, she did agree to try higher dose at 500 mg daily for 1 week.  Option of vascular versus dermatology eval if not improving. Ultimately she started taking Keflex in June 1, 1 in the morning 2 in the afternoon 1 at night.  Has some bilateral leg swelling, right noted at her June 7 visit.  No evidence of DVT on June 9 ultrasound of the right leg. Change to 500 mg twice daily of Keflex, and plan for follow-up within 1 to 2 days of that medication running out.  Has not been seen since June 7 visit.   Finished antibiotic about a month ago. Did tolerate 500mg  BID dosing.  Redness was almost gone at end of the antibiotic, then redness increased past few weeks. No fevers. No wounds. R leg swelling same as last visit -some left leg swelling  At times. Wearing support hose.  Tx: none. aveeno lotion.   Hypertension: Slight elevation at 02/10/2020  visit, but has been recovering from shoulder fracture, suspected pain component. Currently taking amlodipine 7.5 mg daily. (clarified dose - she is taking 7.5mg  BID) Increased from 5 mg previously in May due to elevated readings. Pain is much better in shoulder - followed by Dr. Onnie Graham.  Home readings: 147/70-80 BP Readings from Last 3 Encounters:  03/25/20 (!) 167/64  02/10/20 (!) 158/74  01/30/20 134/70   Lab Results  Component Value Date   CREATININE 0.88 09/26/2019         History Patient Active Problem List   Diagnosis Date Noted  . Elevated blood pressure reading without diagnosis of hypertension 02/14/2019  . Dizziness and giddiness 02/14/2019   Past Medical History:  Diagnosis Date  . Allergy   . Cataract   . GI bleed   . Hypertension 02/2019   Newly diagnosed   . Osteoporosis   . Tuberculosis   . Ulcer of gastric fundus, acute    Past Surgical History:  Procedure Laterality Date  . ABDOMINAL HYSTERECTOMY    . APPENDECTOMY    . Bladder     Bladder tack  . EYE SURGERY    . Rectum     Rectal tear repair   Allergies  Allergen Reactions  . Chocolate Anaphylaxis    Migraines   . Prednisone Hypertension  . Shellfish Allergy   . Clindamycin/Lincomycin   . Erythromycin   . Lisinopril  Angioedema   . Penicillins   . Sulfa Antibiotics    Prior to Admission medications   Medication Sig Start Date End Date Taking? Authorizing Provider  amLODipine (NORVASC) 2.5 MG tablet TAKE 1 TABLET BY MOUTH TWICE A DAY 03/10/20  Yes Wendie Agreste, MD  amLODipine (NORVASC) 5 MG tablet Take 5 mg by mouth 2 (two) times daily. 01/29/20  Yes [provider]  cephALEXin (KEFLEX) 250 MG capsule Take 2 capsules (500 mg total) by mouth 2 (two) times daily. 02/04/20  Yes Wendie Agreste, MD  traMADol (ULTRAM) 50 MG tablet Take 1 tablet (50 mg total) by mouth every 6 (six) hours as needed. 01/20/20  Yes Lajean Saver, MD   Social History   Socioeconomic History  .  Marital status: Widowed    Spouse name: Not on file  . Number of children: 3  . Years of education: Not on file  . Highest education level: Not on file  Occupational History  . Occupation: Clinical cytogeneticist    Comment: Retired   . Occupation: Higher education careers adviser     Comment: Retired   Tobacco Use  . Smoking status: Never Smoker  . Smokeless tobacco: Never Used  Vaping Use  . Vaping Use: Never used  Substance and Sexual Activity  . Alcohol use: Yes    Comment: 1 Glass of Scotch a Night  . Drug use: Never  . Sexual activity: Not Currently  Other Topics Concern  . Not on file  Social History Narrative   Has 3 children 2 daughters and a son, who are very attentive.    Lives alone in a townhouse.    Able to perform all ADL's   Exercises daily   Belongs to social clubs   Does line dancing.    Social Determinants of Health   Financial Resource Strain:   . Difficulty of Paying Living Expenses:   Food Insecurity:   . Worried About Charity fundraiser in the Last Year:   . Arboriculturist in the Last Year:   Transportation Needs:   . Film/video editor (Medical):   Marland Kitchen Lack of Transportation (Non-Medical):   Physical Activity:   . Days of Exercise per Week:   . Minutes of Exercise per Session:   Stress:   . Feeling of Stress :   Social Connections:   . Frequency of Communication with Friends and Family:   . Frequency of Social Gatherings with Friends and Family:   . Attends Religious Services:   . Active Member of Clubs or Organizations:   . Attends Archivist Meetings:   Marland Kitchen Marital Status:   Intimate Partner Violence:   . Fear of Current or Ex-Partner:   . Emotionally Abused:   Marland Kitchen Physically Abused:   . Sexually Abused:     Review of Systems  Constitutional: Negative for fatigue and unexpected weight change.  Respiratory: Negative for chest tightness and shortness of breath.   Cardiovascular: Negative for chest pain, palpitations and leg swelling.    Gastrointestinal: Negative for abdominal pain and blood in stool.  Skin: Positive for color change (l leg redness).  Neurological: Negative for dizziness, syncope, light-headedness and headaches.     Objective:   Vitals:   03/25/20 1430  BP: (!) 167/64  Pulse: 63  Temp: 98.3 F (36.8 C)  TempSrc: Temporal  SpO2: 97%  Weight: 117 lb 9.6 oz (53.3 kg)  Height: 5' (1.524 m)     Physical Exam Vitals reviewed.  Constitutional:      Appearance: She is well-developed.  HENT:     Head: Normocephalic and atraumatic.  Eyes:     Conjunctiva/sclera: Conjunctivae normal.     Pupils: Pupils are equal, round, and reactive to light.  Neck:     Vascular: No carotid bruit.  Cardiovascular:     Rate and Rhythm: Normal rate and regular rhythm.     Heart sounds: Normal heart sounds.  Pulmonary:     Effort: Pulmonary effort is normal.     Breath sounds: Normal breath sounds.  Abdominal:     Palpations: Abdomen is soft. There is no pulsatile mass.     Tenderness: There is no abdominal tenderness.  Musculoskeletal:     Right lower leg: Edema (Bilateral lower extremity edema to mid tibia, no apparent open wounds.  Slight warmth with faint erythema left greater than right lower extremities) present.     Left lower leg: Edema present.  Skin:    General: Skin is warm and dry.  Neurological:     Mental Status: She is alert and oriented to person, place, and time.  Psychiatric:        Behavior: Behavior normal.         Assessment & Plan:  Gina Pace is a 86 y.o. female . Right leg swelling - Plan: Ambulatory referral to Vascular Surgery, cephALEXin (KEFLEX) 500 MG capsule Cellulitis of leg, left - Plan: Ambulatory referral to Vascular Surgery, cephALEXin (KEFLEX) 500 MG capsule  -Pedal edema may be in part due to PVD, will refer to vascular surgery.  However she has been taking higher than recommended dose of amlodipine inadvertently.  Will decrease back to 7.5 mg  daily  -Some warmth/erythema (right leg, stasis dermatitis versus cellulitis.  Improvement previously reported with Keflex, did tolerate 500 mg dosing, restart 500 mg twice daily 7 days.  RTC precautions.  Essential hypertension - Plan: hydrochlorothiazide (HYDRODIURIL) 12.5 MG tablet, Basic metabolic panel  -As above.  Decrease amlodipine back to 7.5 mg daily, add HCTZ 6.25 mg daily, then increase to 12.5 mg if persistent elevated BP.  Orthostatic precautions, RTC precautions, recheck 2 weeks.  Meds ordered this encounter  Medications  . hydrochlorothiazide (HYDRODIURIL) 12.5 MG tablet    Sig: Take 0.5 tablets (6.25 mg total) by mouth daily.    Dispense:  45 tablet    Refill:  1  . cephALEXin (KEFLEX) 500 MG capsule    Sig: Take 1 capsule (500 mg total) by mouth 2 (two) times daily.    Dispense:  14 capsule    Refill:  0   Patient Instructions    Amlodipine can sometimes cause leg swelling and you are taking too much. Return to just 7.5mg  once per day.  Start the new blood pressure medicine hydrochlorothiazide initially 1/2 pill/day. If your blood pressures are over 140/90 in the next week take 1 full pill per day recheck with me in 2 weeks so we can repeat your electrolytes to make sure those look okay.   Restart antibiotic twice per day for possible skin infection in the legs, be seen here or urgent care/emergency room if any worsening symptoms.  I will also refer you to a vascular specialist to look at the legs.    If you have lab work done today you will be contacted with your lab results within the next 2 weeks.  If you have not heard from [place] then please contact us. The fastest way to get your results is  to register for My Chart.   IF you received an x-ray today, you will receive an invoice from [facility]. Please contact [facility] at [phone] with questions or concerns regarding your invoice.   IF you received labwork today, you will receive an invoice  from [facility]. Please contact [facility] at [phone] with questions or concerns regarding your invoice.   Our billing staff will not be able to assist you with questions regarding bills from these companies.  You will be contacted with the lab results as soon as they are available. The fastest way to get your results is to activate your My Chart account. Instructions are located on the last page of this paperwork. If you have not heard from [place] regarding the results in 2 weeks, please contact this office.         Signed, Merri Ray, MD [facility]

## 2020-03-25 NOTE — Patient Instructions (Addendum)
Amlodipine can sometimes cause leg swelling and you are taking too much. Return to just 7.5mg  once per day.  Start the new blood pressure medicine hydrochlorothiazide initially 1/2 pill/day. If your blood pressures are over 140/90 in the next week take 1 full pill per day recheck with me in 2 weeks so we can repeat your electrolytes to make sure those look okay.   Restart antibiotic twice per day for possible skin infection in the legs, be seen here or urgent care/emergency room if any worsening symptoms.  I will also refer you to a vascular specialist to look at the legs.    If you have lab work done today you will be contacted with your lab results within the next 2 weeks.  If you have not heard from [place] then please contact us. The fastest way to get your results is to register for My Chart.   IF you received an x-ray today, you will receive an invoice from [facility]. Please contact [facility] at [phone] with questions or concerns regarding your invoice.   IF you received labwork today, you will receive an invoice from [facility]. Please contact [facility] at [phone] with questions or concerns regarding your invoice.   Our billing staff will not be able to assist you with questions regarding bills from these companies.  You will be contacted with the lab results as soon as they are available. The fastest way to get your results is to activate your My Chart account. Instructions are located on the last page of this paperwork. If you have not heard from [place] regarding the results in 2 weeks, please contact this office.

## 2020-03-26 LAB — BASIC METABOLIC PANEL WITH GFR
BUN/Creatinine Ratio: 20 (ref 12–28)
BUN: 20 mg/dL (ref 10–36)
CO2: 20 mmol/L (ref 20–29)
Calcium: 10.4 mg/dL — ABNORMAL HIGH (ref 8.7–10.3)
Chloride: 105 mmol/L (ref 96–106)
Creatinine, Ser: 1.01 mg/dL — ABNORMAL HIGH (ref 0.57–1.00)
GFR calc Af Amer: 55 mL/min/1.73 — ABNORMAL LOW (ref >59)
GFR calc non Af Amer: 48 mL/min/1.73 — ABNORMAL LOW (ref >59)
Glucose: 89 mg/dL (ref 65–99)
Potassium: 4.5 mmol/L (ref 3.5–5.2)
Sodium: 140 mmol/L (ref 134–144)

## 2020-03-27 ENCOUNTER — Ambulatory Visit: Payer: Medicare Other | Admitting: Family Medicine

## 2020-04-08 ENCOUNTER — Encounter: Payer: Self-pay | Admitting: Family Medicine

## 2020-04-08 ENCOUNTER — Other Ambulatory Visit: Payer: Self-pay

## 2020-04-08 ENCOUNTER — Ambulatory Visit (INDEPENDENT_AMBULATORY_CARE_PROVIDER_SITE_OTHER): Payer: Medicare Other | Admitting: Family Medicine

## 2020-04-08 VITALS — BP 148/76 | HR 57 | Temp 98.5°F | Resp 16 | Ht 60.0 in | Wt 118.0 lb

## 2020-04-08 DIAGNOSIS — I872 Venous insufficiency (chronic) (peripheral): Secondary | ICD-10-CM

## 2020-04-08 DIAGNOSIS — R609 Edema, unspecified: Secondary | ICD-10-CM

## 2020-04-08 DIAGNOSIS — I10 Essential (primary) hypertension: Secondary | ICD-10-CM | POA: Diagnosis not present

## 2020-04-08 NOTE — Patient Instructions (Addendum)
Home blood pressures look okay.Continue the amlodipine at 7.5mg  per day, and half dose of hydrochlorothiazide per day.   Redness in the legs may be due to stasis dermatitis or redness from the swelling.  Try light compression stockings during the day (You do not need to wear at night or if seated with legs elevated). I expect the redness and swelling to improve.  If any worsening redness, wounds of the skin, or other worsening symptoms return for recheck.  Otherwise follow-up with vascular specialist as planned.  Return to the clinic or go to the nearest emergency room if any of your symptoms worsen or new symptoms occur.    If you have lab work done today you will be contacted with your lab results within the next 2 weeks.  If you have not heard from [place] then please contact us. The fastest way to get your results is to register for My Chart.   IF you received an x-ray today, you will receive an invoice from [facility]. Please contact [facility] at [phone] with questions or concerns regarding your invoice.   IF you received labwork today, you will receive an invoice from [facility]. Please contact [facility] at [phone] with questions or concerns regarding your invoice.   Our billing staff will not be able to assist you with questions regarding bills from these companies.  You will be contacted with the lab results as soon as they are available. The fastest way to get your results is to activate your My Chart account. Instructions are located on the last page of this paperwork. If you have not heard from [place] regarding the results in 2 weeks, please contact this office.

## 2020-04-08 NOTE — Progress Notes (Signed)
Subjective:  Patient ID: Gina Pace, female    DOB: 1924-09-13  Age: 86 y.o. MRN: [id number]  CC:  Chief Complaint  Patient presents with  . Leg Swelling    BOTH - STARTED 2 WEEKS AGO, PER PATIENT  . Hypertension    HPI Gina Pace presents for   Leg swelling See previous notes.  Has had possible stasis dermatitis versus cellulitis treatment previously.  Redness increased previous few weeks at July 21 visit.  Right leg swelling has been persistent since previous visit with episodic left leg swelling.  Was wearing support hose.  Suspected component of peripheral vascular disease, refer to vascular surgery.  However she had also been taking too much amlodipine, decreased back to 7.5 mg a day from 15 mg.  Keflex restarted for possible recurrence of cellulitis.  500 mg twice daily for 1 week. Still using compression stockings. Swelling about the same. No fever, no pain in skin. No wounds.  Finished antibiotic - thought that redness about the same on antibiotic,  Sore to walk in the morning in calves, resolves after few steps. No recurrence. swelling better overnight and less red in morning.  No CP/dyspnea.  Darkened patch on left leg present for 60+ years. No changes.  bilateral LE ultrasound negative for DVT 12/09/19, repeat RLE u/s on 02/12/20 - neg for dvt.    Hypertension: As above has been using too much amlodipine.  Back to 7.5 mg daily, added HCTZ 6.25 mg daily.  Option of 12.5 mg depending on home readings. Taking amlodipine 7.5mg  and hctz 1/2 pill - no new side effects.  Home readings: 142/71, 135/66, 129/75, 143/65. BP Readings from Last 3 Encounters:  04/08/20 (!) 148/76  03/25/20 (!) 167/64  02/10/20 (!) 158/74   Lab Results  Component Value Date   CREATININE 1.01 (H) 03/25/2020     History Patient Active Problem List   Diagnosis Date Noted  . Elevated blood pressure reading without diagnosis of hypertension 02/14/2019  . Dizziness and giddiness  02/14/2019   Past Medical History:  Diagnosis Date  . Allergy   . Cataract   . GI bleed   . Hypertension 02/2019   Newly diagnosed   . Osteoporosis   . Tuberculosis   . Ulcer of gastric fundus, acute    Past Surgical History:  Procedure Laterality Date  . ABDOMINAL HYSTERECTOMY    . APPENDECTOMY    . Bladder     Bladder tack  . EYE SURGERY    . Rectum     Rectal tear repair   Allergies  Allergen Reactions  . Chocolate Anaphylaxis    Migraines   . Prednisone Hypertension  . Shellfish Allergy   . Clindamycin/Lincomycin   . Erythromycin   . Lisinopril     Angioedema   . Penicillins   . Sulfa Antibiotics    Prior to Admission medications   Medication Sig Start Date End Date Taking? Authorizing Provider  amLODipine (NORVASC) 2.5 MG tablet TAKE 1 TABLET BY MOUTH TWICE A DAY 03/10/20  Yes Wendie Agreste, MD  amLODipine (NORVASC) 5 MG tablet Take 5 mg by mouth 2 (two) times daily. 01/29/20  Yes [provider]  hydrochlorothiazide (HYDRODIURIL) 12.5 MG tablet Take 0.5 tablets (6.25 mg total) by mouth daily. 03/25/20  Yes Wendie Agreste, MD  cephALEXin (KEFLEX) 500 MG capsule Take 1 capsule (500 mg total) by mouth 2 (two) times daily. Patient not taking: Reported on 04/08/2020 03/25/20   Wendie Agreste,  MD  traMADol (ULTRAM) 50 MG tablet Take 1 tablet (50 mg total) by mouth every 6 (six) hours as needed. Patient not taking: Reported on 04/08/2020 01/20/20   Lajean Saver, MD   Social History   Socioeconomic History  . Marital status: Widowed    Spouse name: Not on file  . Number of children: 3  . Years of education: Not on file  . Highest education level: Not on file  Occupational History  . Occupation: Clinical cytogeneticist    Comment: Retired   . Occupation: Higher education careers adviser     Comment: Retired   Tobacco Use  . Smoking status: Never Smoker  . Smokeless tobacco: Never Used  Vaping Use  . Vaping Use: Never used  Substance and Sexual Activity  . Alcohol use:  Yes    Comment: 1 Glass of Scotch a Night  . Drug use: Never  . Sexual activity: Not Currently  Other Topics Concern  . Not on file  Social History Narrative   Has 3 children 2 daughters and a son, who are very attentive.    Lives alone in a townhouse.    Able to perform all ADL's   Exercises daily   Belongs to social clubs   Does line dancing.    Social Determinants of Health   Financial Resource Strain:   . Difficulty of Paying Living Expenses:   Food Insecurity:   . Worried About Charity fundraiser in the Last Year:   . Arboriculturist in the Last Year:   Transportation Needs:   . Film/video editor (Medical):   Marland Kitchen Lack of Transportation (Non-Medical):   Physical Activity:   . Days of Exercise per Week:   . Minutes of Exercise per Session:   Stress:   . Feeling of Stress :   Social Connections:   . Frequency of Communication with Friends and Family:   . Frequency of Social Gatherings with Friends and Family:   . Attends Religious Services:   . Active Member of Clubs or Organizations:   . Attends Archivist Meetings:   Marland Kitchen Marital Status:   Intimate Partner Violence:   . Fear of Current or Ex-Partner:   . Emotionally Abused:   Marland Kitchen Physically Abused:   . Sexually Abused:     Review of Systems Per HPI.   Objective:   Vitals:   04/08/20 1353 04/08/20 1447  BP: (!) 163/57 (!) 148/76  Pulse: (!) 57   Resp: 16   Temp: 98.5 F (36.9 C)   TempSrc: Temporal   SpO2: 97%   Weight: 118 lb (53.5 kg)   Height: 5' (1.524 m)      Physical Exam Vitals reviewed.  Constitutional:      Appearance: She is well-developed.  HENT:     Head: Normocephalic and atraumatic.  Eyes:     Conjunctiva/sclera: Conjunctivae normal.     Pupils: Pupils are equal, round, and reactive to light.  Neck:     Vascular: No carotid bruit.  Cardiovascular:     Rate and Rhythm: Normal rate and regular rhythm.     Heart sounds: Normal heart sounds.  Pulmonary:     Effort:  Pulmonary effort is normal.     Breath sounds: Normal breath sounds.  Abdominal:     Palpations: Abdomen is soft. There is no pulsatile mass.     Tenderness: There is no abdominal tenderness.  Musculoskeletal:     Right lower leg: Edema present.  Left lower leg: Edema present.     Comments: Edema to prox tibia, faint erythema bilaterally, no new wounds appreciated. Skin nontender, no calf ttp. No appreciable warmth.   Skin:    General: Skin is warm and dry.  Neurological:     General: No focal deficit present.     Mental Status: She is alert and oriented to person, place, and time.  Psychiatric:        Behavior: Behavior normal.     Assessment & Plan:  Gina Pace is a 86 y.o. female . Peripheral edema Stasis dermatitis of both legs  - less likely cellulitis at this point. Possible stasis dermatitis. Reports improved edema/erythema overnight.   - leg elevation, or light intensity compression stockings during day.   - follow up with vascular.   - rtc precautions if increasing redness, wounds, worsening.   Essential hypertension  - better home readings, suspect component of white coat htn. No med changes for now.   - 3 month follow up .   No orders of the defined types were placed in this encounter.  Patient Instructions   Home blood pressures look okay.Continue the amlodipine at 7.5mg  per day, and half dose of hydrochlorothiazide per day.   Redness in the legs may be due to stasis dermatitis or redness from the swelling.  Try light compression stockings during the day (You do not need to wear at night or if seated with legs elevated). I expect the redness and swelling to improve.  If any worsening redness, wounds of the skin, or other worsening symptoms return for recheck.  Otherwise follow-up with vascular specialist as planned.  Return to the clinic or go to the nearest emergency room if any of your symptoms worsen or new symptoms occur.    If you have lab work  done today you will be contacted with your lab results within the next 2 weeks.  If you have not heard from [place] then please contact us. The fastest way to get your results is to register for My Chart.   IF you received an x-ray today, you will receive an invoice from [facility]. Please contact [facility] at [phone] with questions or concerns regarding your invoice.   IF you received labwork today, you will receive an invoice from [facility]. Please contact [facility] at [phone] with questions or concerns regarding your invoice.   Our billing staff will not be able to assist you with questions regarding bills from these companies.  You will be contacted with the lab results as soon as they are available. The fastest way to get your results is to activate your My Chart account. Instructions are located on the last page of this paperwork. If you have not heard from [place] regarding the results in 2 weeks, please contact this office.         Signed, Merri Ray, MD [facility]

## 2020-04-30 IMAGING — CT CT HEAD WITHOUT CONTRAST
4 series · 16 of 47 positions shown, 18 images · non-contrast
Comparison: None.

CLINICAL DATA: [AGE] female with chronic headache.

EXAM:
CT HEAD WITHOUT CONTRAST
TECHNIQUE: Contiguous axial images were obtained from the base of the skull
through the vertex without intravenous contrast.

[Series 3: head wo · axial · 0.40mm/px · z∈[-135,-20]mm · 7 of 31 slices shown, 9 images]
[im 4/31  brain]
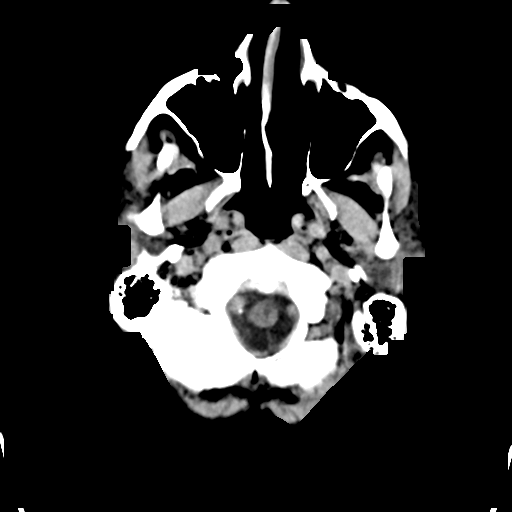
[im 4/31  bone]
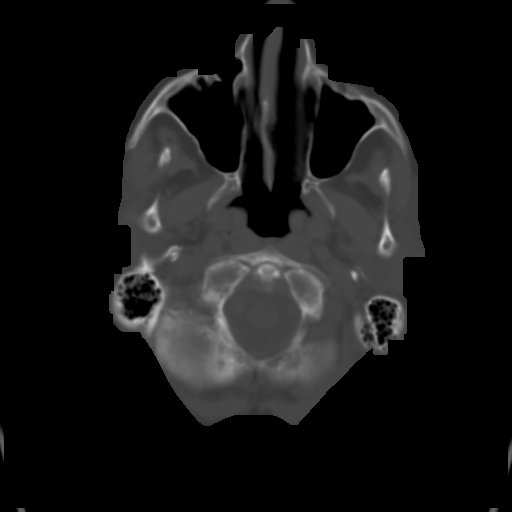
[im 8/31  brain]
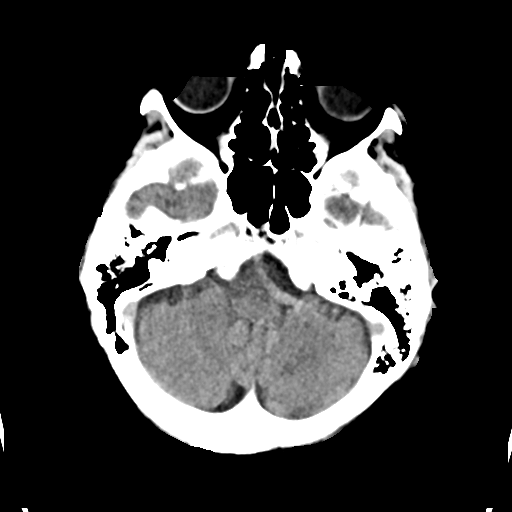
[im 12/31  brain]
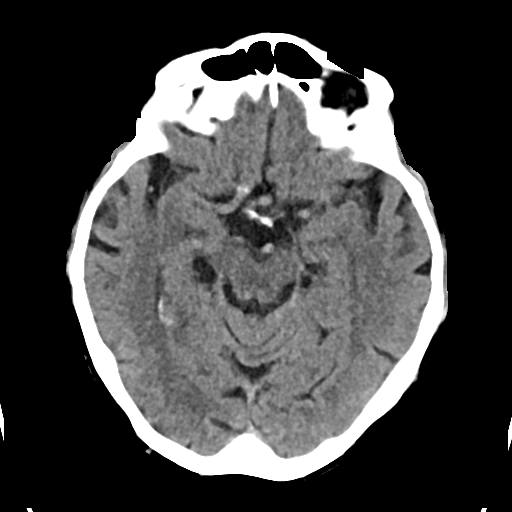
[im 16/31  brain]
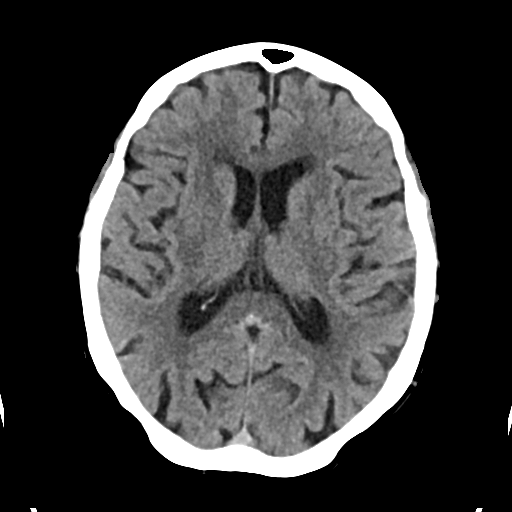
[im 19/31  brain]
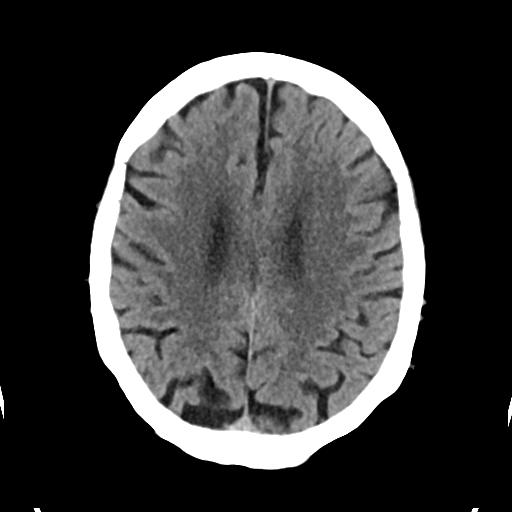
[im 19/31  bone]
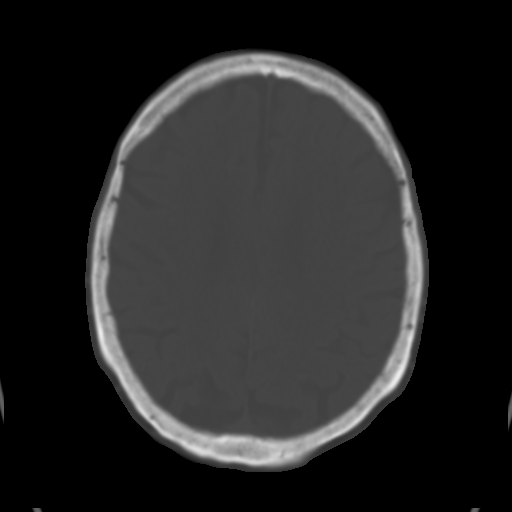
[im 23/31  brain]
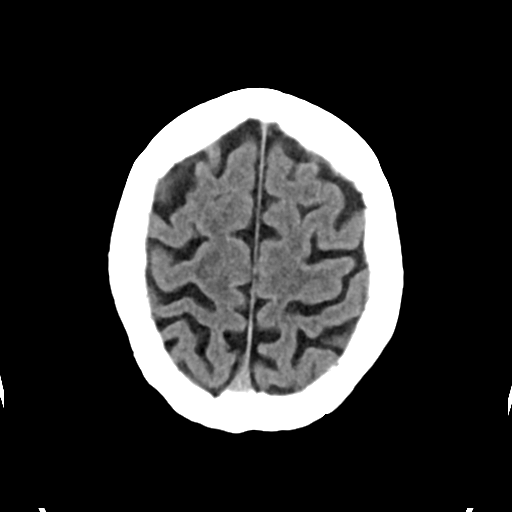
[im 27/31  brain]
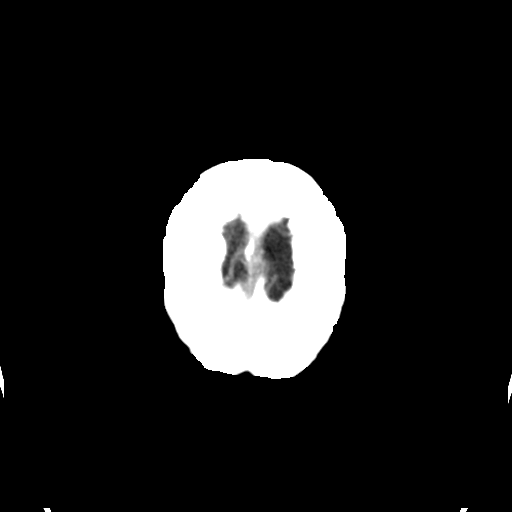

[Series 4: head bone · axial · 0.40mm/px · z∈[-136,-106]mm · 3 of 76 slices shown]
[im 8/76  bone]
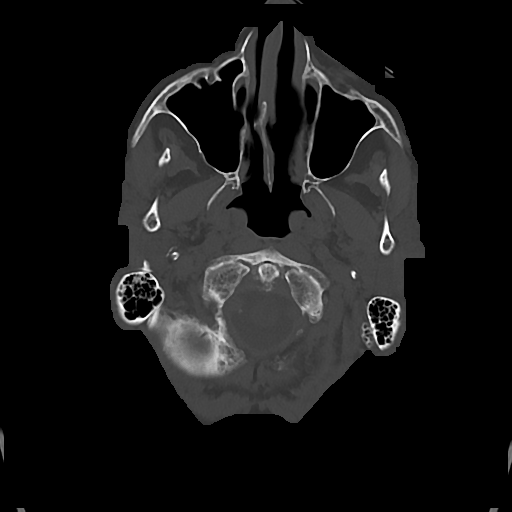
[im 16/76  bone]
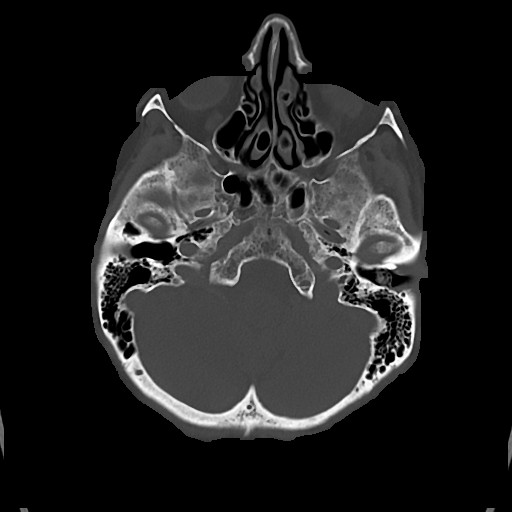
[im 23/76  bone]
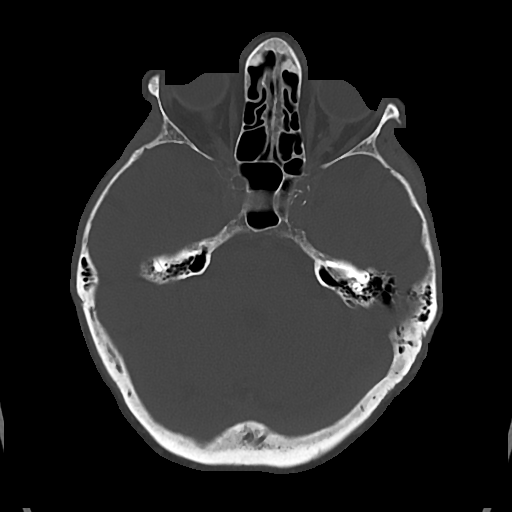

[Series 5: cor soft · coronal · 0.30mm/px · 3 of 67 slices shown]
[im 23/67  brain]
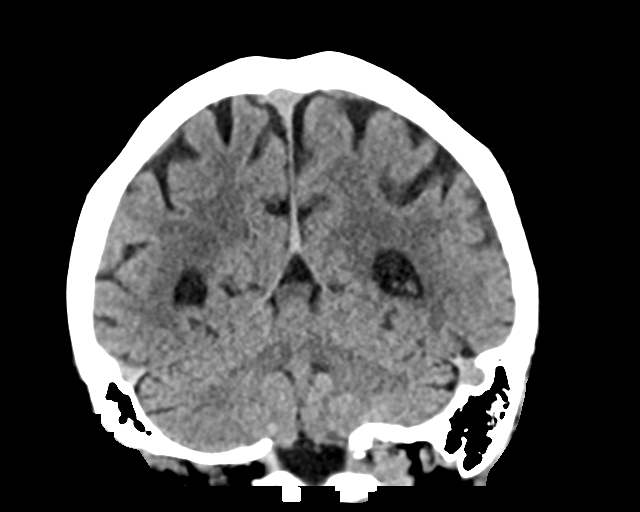
[im 30/67  brain]
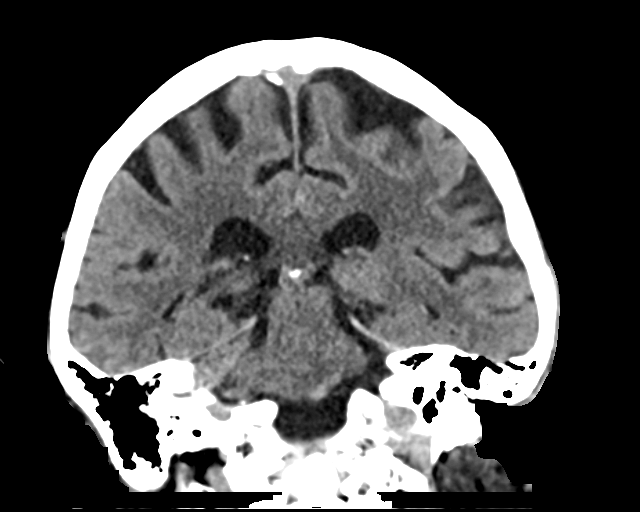
[im 37/67  brain]
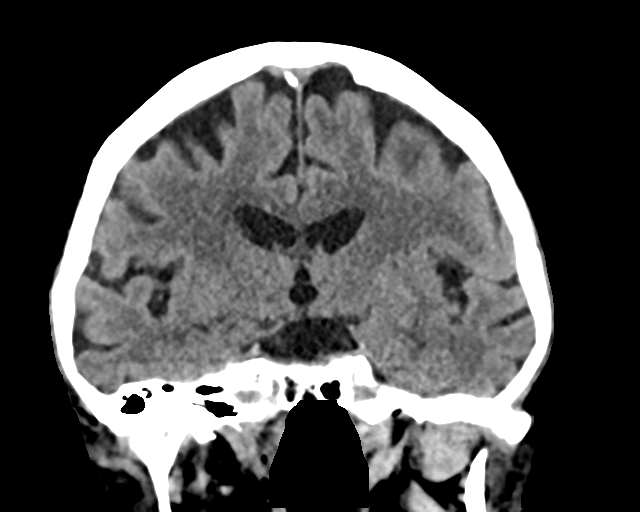

[Series 6: sag soft · sagittal · 0.30mm/px · 3 of 58 slices shown]
[im 20/58  brain]
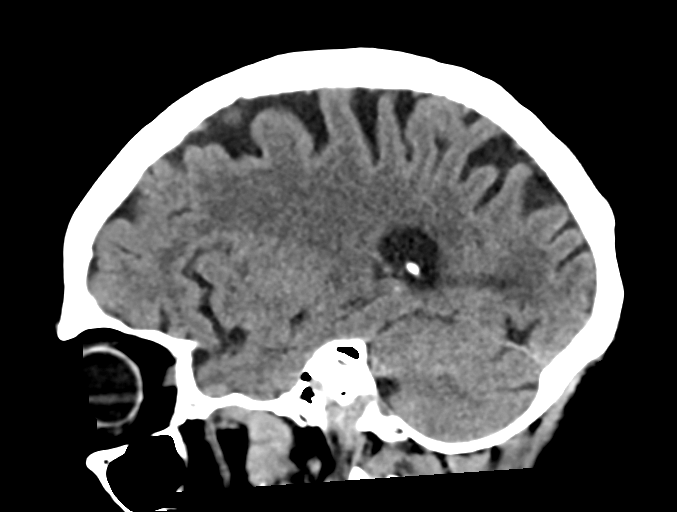
[im 29/58  brain]
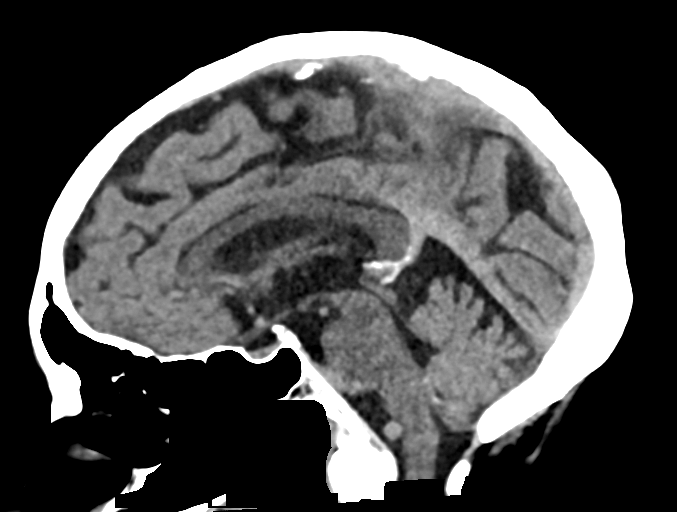
[im 39/58  brain]
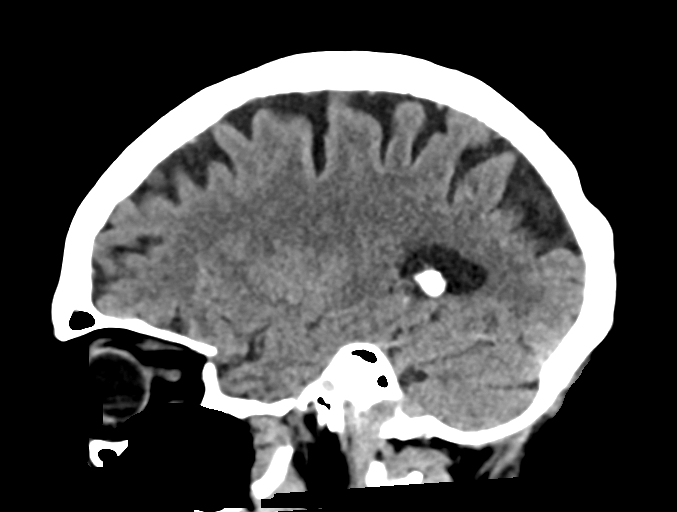

[16 of 47 positions shown; findings below may reference images not displayed]

FINDINGS: Brain: There is mild age-related atrophy and chronic microvascular
ischemic changes. There is no acute intracranial hemorrhage. No mass
effect midline shift. No extra-axial fluid collection.

Vascular: No hyperdense vessel or unexpected calcification.

Skull: Normal. Negative for fracture or focal lesion.

Sinuses/Orbits: No acute finding.

Other: None
IMPRESSION: 1. No acute intracranial hemorrhage.
2. Mild age-related atrophy and chronic microvascular ischemic
changes.

## 2020-05-13 ENCOUNTER — Other Ambulatory Visit: Payer: Self-pay

## 2020-05-13 DIAGNOSIS — M7989 Other specified soft tissue disorders: Secondary | ICD-10-CM

## 2020-05-26 ENCOUNTER — Ambulatory Visit: Payer: Medicare Other | Admitting: Physician Assistant

## 2020-05-26 ENCOUNTER — Other Ambulatory Visit: Payer: Self-pay

## 2020-05-26 ENCOUNTER — Ambulatory Visit (HOSPITAL_COMMUNITY)
Admission: RE | Admit: 2020-05-26 | Discharge: 2020-05-26 | Disposition: A | Discharge status: Home / Self Care | Payer: Medicare Other | Source: Ambulatory Visit | Attending: Vascular Surgery | Admitting: Vascular Surgery

## 2020-05-26 VITALS — BP 160/80 | HR 81 | Temp 98.3°F | Resp 20 | Ht 60.0 in | Wt 117.9 lb

## 2020-05-26 DIAGNOSIS — M7989 Other specified soft tissue disorders: Secondary | ICD-10-CM | POA: Insufficient documentation

## 2020-05-26 NOTE — Progress Notes (Signed)
[facility]           [facility] [place]  History and Physical   Gina Pace is a 86 y.o. female who appears younger than stated age and is accoompanied by her daughter. She presents with leg swelling and discoloration.  She was seen by her PCP on 04/08/2020 and at that time, she has had several episodes of cellulitis vs stasis dermatitis dating back to March.  She was treated with Doxycycline and u/s was negative for DVT.  She continued to have some cellulitis and was treated with Keflex.  She did have some intolerances to medication but did tolerate this.  She was wearing compression hose.  She was on an increased dose of amlodipine but this was decreased.    The patient has does not history of DVT. Pt does have history of varicose vein.   Pt does have history of skin changes in lower legs.     The patient has used compression stockings in the past for 65 years.    She states that she has had swelling for some time.  It is better in the mornings after she has been in bed but legs are swollen shortly after that.  She states that she has had some redness and was treated with abx.  She states many years ago, she bumped her left leg and it has been discolored ever since then.  It does not bother her.  She does not have any pain.  She has worn light knee high compression for many many years.  She states that she given some tighter compression, but she struggled getting them on and didn't wear them.    The pt is not on a statin for cholesterol management.  The pt is not on a daily aspirin.   Other AC:  none The pt is on CCB for hypertension.   The pt is not diabetic.   Tobacco hx:  never  No family hx of AAA.    She lives alone.  Her husband of 87 years died two years ago.   Past Medical History:  Diagnosis Date  . Allergy   . Cataract   . GI bleed   . Hypertension 02/2019   Newly diagnosed   . Osteoporosis   . Tuberculosis   . Ulcer of gastric fundus,  acute     Past Surgical History:  Procedure Laterality Date  . ABDOMINAL HYSTERECTOMY    . APPENDECTOMY    . Bladder     Bladder tack  . EYE SURGERY    . Rectum     Rectal tear repair    Social History   Socioeconomic History  . Marital status: Widowed    Spouse name: Not on file  . Number of children: 3  . Years of education: Not on file  . Highest education level: Not on file  Occupational History  . Occupation: Clinical cytogeneticist    Comment: Retired   . Occupation: Higher education careers adviser     Comment: Retired   Tobacco Use  . Smoking status: Never Smoker  . Smokeless tobacco: Never Used  Vaping Use  . Vaping Use: Never used  Substance and Sexual Activity  . Alcohol use: Yes    Comment: 1 Glass of Scotch a Night  . Drug use: Never  . Sexual activity: Not Currently  Other Topics Concern  . Not on file  Social History Narrative   Has 3 children  2 daughters and a son, who are very attentive.    Lives alone in a townhouse.    Able to perform all ADL's   Exercises daily   Belongs to social clubs   Does line dancing.    Social Determinants of Health   Financial Resource Strain:   . Difficulty of Paying Living Expenses: Not on file  Food Insecurity:   . Worried About Charity fundraiser in the Last Year: Not on file  . Ran Out of Food in the Last Year: Not on file  Transportation Needs:   . Lack of Transportation (Medical): Not on file  . Lack of Transportation (Non-Medical): Not on file  Physical Activity:   . Days of Exercise per Week: Not on file  . Minutes of Exercise per Session: Not on file  Stress:   . Feeling of Stress : Not on file  Social Connections:   . Frequency of Communication with Friends and Family: Not on file  . Frequency of Social Gatherings with Friends and Family: Not on file  . Attends Religious Services: Not on file  . Active Member of Clubs or Organizations: Not on file  . Attends Archivist Meetings: Not on file  . Marital Status:  Not on file  Intimate Partner Violence:   . Fear of Current or Ex-Partner: Not on file  . Emotionally Abused: Not on file  . Physically Abused: Not on file  . Sexually Abused: Not on file    Family History  Problem Relation Age of Onset  . CAD Mother 37  . Heart attack Mother   . CAD Father 57  . Heart attack Father   . Congestive Heart Failure Sister   . CAD Sister        Hx of CABG  . Emphysema Brother     Current Outpatient Medications  Medication Sig Dispense Refill  . amLODipine (NORVASC) 2.5 MG tablet TAKE 1 TABLET BY MOUTH TWICE A DAY 90 tablet 3  . amLODipine (NORVASC) 5 MG tablet Take 5 mg by mouth 2 (two) times daily.    . cephALEXin (KEFLEX) 500 MG capsule Take 1 capsule (500 mg total) by mouth 2 (two) times daily. (Patient not taking: Reported on 04/08/2020) 14 capsule 0  . hydrochlorothiazide (HYDRODIURIL) 12.5 MG tablet Take 0.5 tablets (6.25 mg total) by mouth daily. 45 tablet 1  . traMADol (ULTRAM) 50 MG tablet Take 1 tablet (50 mg total) by mouth every 6 (six) hours as needed. (Patient not taking: Reported on 04/08/2020) 15 tablet 0   No current facility-administered medications for this visit.    Allergies  Allergen Reactions  . Chocolate Anaphylaxis    Migraines   . Prednisone Hypertension  . Shellfish Allergy   . Clindamycin/Lincomycin   . Erythromycin   . Lisinopril     Angioedema   . Penicillins   . Sulfa Antibiotics     REVIEW OF SYSTEMS:   [X]  denotes positive finding, [ ]  denotes negative finding Cardiac  Comments:  Chest pain or chest pressure:    Shortness of breath upon exertion:    Short of breath when lying flat:    Irregular heart rhythm:        Vascular    Pain in calf, thigh, or hip brought on by ambulation:    Pain in feet at night that wakes you up from your sleep:     Blood clot in your veins:    Leg swelling:  x  Pulmonary    Oxygen at home:    Productive cough:     Wheezing:         Neurologic    Sudden  weakness in arms or legs:     Sudden numbness in arms or legs:     Sudden onset of difficulty speaking or slurred speech:    Temporary loss of vision in one eye:     Problems with dizziness:         Gastrointestinal    Blood in stool:     Vomited blood:         Genitourinary    Burning when urinating:     Blood in urine:        Psychiatric    Major depression:         Hematologic    Bleeding problems:    Problems with blood clotting too easily:        Skin    Rashes or ulcers:        Constitutional    Fever or chills:      PHYSICAL EXAMINATION:  Today's Vitals   05/26/20 1235  BP: (!) 160/80  Pulse: 81  Resp: 20  Temp: 98.3 F (36.8 C)  TempSrc: Temporal  SpO2: 98%  Weight: 117 lb 14.4 oz (53.5 kg)  Height: 5' (1.524 m)  PainSc: 0-No pain   Body mass index is 23.03 kg/m.   General:  WDWN in NAD; vital signs documented above Gait: Not observed HENT: WNL, normocephalic Pulmonary: normal non-labored breathing without wheezing Cardiac: regular HR; without carotid bruits Abdomen: soft, NT, no masses; aortic pulse is not palpable Skin: without rashes Vascular Exam/Pulses:  Right Left  Radial 2+ (normal) 2+ (normal)  DP 2+ (normal) 2+ (normal)  PT Unable to palpate Unable to palpate  Unable to palpate bilateral popliteal pulses  Extremities: without ischemic changes, without cellulitis; without open wounds; with skin color changes  (pic from 04/08/2020)  Musculoskeletal: no muscle wasting or atrophy  Neurologic: A&O X 3;  moving all extremities equally Psychiatric:  The pt has Normal affect.   Non-Invasive Vascular Imaging:   Venous duplex on 05/26/2020: Venous Reflux Times  +--------------+---------+------+-----------+------------+--------+  RIGHT     Reflux NoRefluxReflux TimeDiameter cmsComments               Yes                   +--------------+---------+------+-----------+------------+--------+  CFV             yes  >1 second             +--------------+---------+------+-----------+------------+--------+  FV mid          yes  >1 second             +--------------+---------+------+-----------+------------+--------+  Popliteal   no                         +--------------+---------+------+-----------+------------+--------+  GSV at SFJ        yes  >500 ms   0.48        +--------------+---------+------+-----------+------------+--------+  GSV prox thighno               0.37        +--------------+---------+------+-----------+------------+--------+  GSV mid thigh no               0.28        +--------------+---------+------+-----------+------------+--------+  GSV dist thigh      yes  >  500 ms   0.29        +--------------+---------+------+-----------+------------+--------+  GSV at knee  no               0.19        +--------------+---------+------+-----------+------------+--------+  SSV Pop Fossa no               0.17        +--------------+---------+------+-----------+------------+--------+     +--------------+---------+------+-----------+------------+--------+  LEFT     Reflux NoRefluxReflux TimeDiameter cmsComments               Yes                   +--------------+---------+------+-----------+------------+--------+  CFV            yes  >1 second             +--------------+---------+------+-----------+------------+--------+  FV mid    no                         +--------------+---------+------+-----------+------------+--------+  Popliteal         yes  >1 second               +--------------+---------+------+-----------+------------+--------+  GSV at SFJ        yes  >500 ms   0.64        +--------------+---------+------+-----------+------------+--------+  GSV prox thigh      yes  >500 ms   0.32        +--------------+---------+------+-----------+------------+--------+  GSV mid thigh       yes  >500 ms   0.29        +--------------+---------+------+-----------+------------+--------+  GSV dist thigh      yes  >500 ms   0.32        +--------------+---------+------+-----------+------------+--------+  GSV at knee        yes  >500 ms   0.30        +--------------+---------+------+-----------+------------+--------+  SSV Pop Fossa no               0.25        +--------------+---------+------+-----------+------------+--------+   Summary:  Right:  - No evidence of deep vein thrombosis from the common femoral through the  popliteal veins.  - No evidence of superficial venous thrombosis.  - The deep system is not competent.  - The great saphenous vein is not competent.  - The small saphenous vein is competent.    Left:  - No evidence of deep vein thrombosis from the common femoral through the  popliteal veins.  - No evidence of superficial venous thrombosis.  - The deep system is not competent.  - The great saphenous vein is not competent.  - The small saphenous vein is competent.    Gina Pace is a 86 y.o. female who presents with: leg swelling and discoloration  Pt has easily palpable DP pulses bilaterally.  She does have deep and superficial venous reflux.  She is not a candidate for laser ablation.   -discussed with pt about wearing thigh high 20-57mmHg compression stockings.  She is measured today and we will put stockings on her here to see if she is going to be able to tolerate.  We discussed putting these on  in the morning before her feet touch the floor and take them off at night.   If she cannot, she can wear 15-24mmHg knee high compression bc I would rather her have some compression than none.  I also discussed  with her about elevating legs at least 15 minutes per day.   -pt will f/u as needed.   Gina Pace, [facility] 05/26/2020 11:43 AM  Clinic MD:  Scot Dock on call MD

## 2020-06-23 NOTE — Progress Notes (Signed)
HPI: FU hypertension. Patient does have a history of angioedema with lisinopril. Chest CT June 2020 showed dilated pulmonary artery and cardiomegaly. Echocardiogram July 2020 showed normal LV function, mild LVH, mild diastolic dysfunction, moderate left atrial enlargement, moderate mitral regurgitation, moderate aortic insufficiency and moderate pulmonic insufficiency.   Lower extremity venous Dopplers September 2021 showed no DVT.  Since last seen,  patient denies dyspnea, chest pain or syncope.  No pedal edema.  Current Outpatient Medications  Medication Sig Dispense Refill   amLODipine (NORVASC) 2.5 MG tablet TAKE 1 TABLET BY MOUTH TWICE A DAY 90 tablet 3   amLODipine (NORVASC) 5 MG tablet Take 5 mg by mouth 2 (two) times daily.     No current facility-administered medications for this visit.     Past Medical History:  Diagnosis Date   Allergy    Cataract    GI bleed    Hypertension 02/2019   Newly diagnosed    Osteoporosis    Tuberculosis    Ulcer of gastric fundus, acute     Past Surgical History:  Procedure Laterality Date   ABDOMINAL HYSTERECTOMY     APPENDECTOMY     Bladder     Bladder tack   EYE SURGERY     Rectum     Rectal tear repair    Social History   Socioeconomic History   Marital status: Widowed    Spouse name: Not on file   Number of children: 3   Years of education: Not on file   Highest education level: Not on file  Occupational History   Occupation: Book Therapist, nutritional    Comment: Retired    Occupation: Higher education careers adviser     Comment: Retired   Tobacco Use   Smoking status: Never Smoker   Smokeless tobacco: Never Used  Scientific laboratory technician Use: Never used  Substance and Sexual Activity   Alcohol use: Yes    Comment: 1 Glass of Scotch a Night   Drug use: Never   Sexual activity: Not Currently  Other Topics Concern   Not on file  Social History Narrative   Has 3 children 2 daughters and a son, who are very  attentive.    Lives alone in a townhouse.    Able to perform all ADL's   Exercises daily   Belongs to social clubs   Does line dancing.    Social Determinants of Health   Financial Resource Strain:    Difficulty of Paying Living Expenses: Not on file  Food Insecurity:    Worried About Charity fundraiser in the Last Year: Not on file   [employer] of Food in the Last Year: Not on file  Transportation Needs:    Lack of Transportation (Medical): Not on file   Lack of Transportation (Non-Medical): Not on file  Physical Activity:    Days of Exercise per Week: Not on file   Minutes of Exercise per Session: Not on file  Stress:    Feeling of Stress : Not on file  Social Connections:    Frequency of Communication with Friends and Family: Not on file   Frequency of Social Gatherings with Friends and Family: Not on file   Attends Religious Services: Not on file   Active Member of Clubs or Organizations: Not on file   Attends Archivist Meetings: Not on file   Marital Status: Not on file  Intimate Partner Violence:    Fear of Current or  Ex-Partner: Not on file   Emotionally Abused: Not on file   Physically Abused: Not on file   Sexually Abused: Not on file    Family History  Problem Relation Age of Onset   CAD Mother 68   Heart attack Mother    CAD Father 39   Heart attack Father    Congestive Heart Failure Sister    CAD Sister        Hx of CABG   Emphysema Brother     ROS: no fevers or chills, productive cough, hemoptysis, dysphasia, odynophagia, melena, hematochezia, dysuria, hematuria, rash, seizure activity, orthopnea, PND, pedal edema, claudication. Remaining systems are negative.  Physical Exam: Well-developed well-nourished in no acute distress.  Skin is warm and dry.  HEENT is normal.  Neck is supple.  Chest is clear to auscultation with normal expansion.  Cardiovascular exam is irregular.  2/6 systolic murmur left sternal  border. Abdominal exam nontender or distended. No masses palpated. Extremities show trace edema. neuro grossly intact  ECG-sinus rhythm with occasional PAC and PVC, right bundle branch block and no ST changes.  Personally reviewed  A/P  1 hypertension-patient's blood pressure is elevated; I have asked her to follow this and we will increase amlodipine if needed.  2 moderate aortic and mitral regurgitation-we will plan to repeat echocardiogram.  However given age would like to be conservative.  Kirk Ruths, MD

## 2020-06-29 ENCOUNTER — Other Ambulatory Visit: Payer: Self-pay

## 2020-06-29 ENCOUNTER — Encounter: Payer: Self-pay | Admitting: Cardiology

## 2020-06-29 ENCOUNTER — Ambulatory Visit: Payer: Medicare Other | Admitting: Cardiology

## 2020-06-29 VITALS — BP 160/62 | HR 85 | Ht 60.0 in | Wt 119.6 lb

## 2020-06-29 DIAGNOSIS — I351 Nonrheumatic aortic (valve) insufficiency: Secondary | ICD-10-CM | POA: Diagnosis not present

## 2020-06-29 DIAGNOSIS — I059 Rheumatic mitral valve disease, unspecified: Secondary | ICD-10-CM | POA: Diagnosis not present

## 2020-06-29 NOTE — Patient Instructions (Signed)

## 2020-06-30 DIAGNOSIS — H353133 Nonexudative age-related macular degeneration, bilateral, advanced atrophic without subfoveal involvement: Secondary | ICD-10-CM | POA: Diagnosis not present

## 2020-07-08 ENCOUNTER — Ambulatory Visit: Payer: Medicare Other | Admitting: Family Medicine

## 2020-07-13 ENCOUNTER — Other Ambulatory Visit: Payer: Self-pay

## 2020-07-13 ENCOUNTER — Encounter: Payer: Self-pay | Admitting: Family Medicine

## 2020-07-13 ENCOUNTER — Ambulatory Visit (INDEPENDENT_AMBULATORY_CARE_PROVIDER_SITE_OTHER): Payer: Medicare Other | Admitting: Family Medicine

## 2020-07-13 VITALS — BP 148/86 | HR 83 | Temp 98.2°F | Ht 60.0 in | Wt 119.0 lb

## 2020-07-13 DIAGNOSIS — R519 Headache, unspecified: Secondary | ICD-10-CM

## 2020-07-13 DIAGNOSIS — R5383 Other fatigue: Secondary | ICD-10-CM | POA: Diagnosis not present

## 2020-07-13 DIAGNOSIS — H539 Unspecified visual disturbance: Secondary | ICD-10-CM | POA: Diagnosis not present

## 2020-07-13 DIAGNOSIS — R609 Edema, unspecified: Secondary | ICD-10-CM | POA: Diagnosis not present

## 2020-07-13 DIAGNOSIS — I10 Essential (primary) hypertension: Secondary | ICD-10-CM

## 2020-07-13 MED ORDER — AMLODIPINE BESYLATE 5 MG PO TABS: 5.0000 mg | ORAL_TABLET | Freq: Two times a day (BID) | ORAL | 2 refills | Status: DC

## 2020-07-13 NOTE — Patient Instructions (Addendum)
Keep a record of your blood pressures outside of the office and if remaining over 140/90 - let me know and we can discuss more med changes.  For now change to amlodipine 52m twice per day.   The vascular specialist did recommend the thigh-high compression stockings as much as possible but if you are unable to tolerate those because they were too tight they did discuss the lower intensity compression stockings for the knees down.  I would recommend talking to their office about what may be the best compression stockings for you to use throughout the day.   Call your eye doctor today to be seen as soon as possible due to your continued symptoms of headache and blurry vision. I would like you seen tomorrow if possible.   I will check some labs for fatigue but if that is not improving with change in blood pressure medicines, be seen in the next week to 10 days.  Return to the clinic or go to the nearest emergency room if any of your symptoms worsen or new symptoms occur.   Fatigue If you have fatigue, you feel tired all the time and have a lack of energy or a lack of motivation. Fatigue may make it difficult to start or complete tasks because of exhaustion. In general, occasional or mild fatigue is often a normal response to activity or life. However, long-lasting (chronic) or extreme fatigue may be a symptom of a medical condition. Follow these instructions at home: General instructions  Watch your fatigue for any changes.  Go to bed and get up at the same time every day.  Avoid fatigue by pacing yourself during the day and getting enough sleep at night.  Maintain a healthy weight. Medicines  Take over-the-counter and prescription medicines only as told by your health care provider.  Take a multivitamin, if told by your health care provider.  Do not use herbal or dietary supplements unless they are approved by your health care provider. Activity   Exercise regularly, as told by your  health care provider.  Use or practice techniques to help you relax, such as yoga, tai chi, meditation, or massage therapy. Eating and drinking   Avoid heavy meals in the evening.  Eat a well-balanced diet, which includes lean proteins, whole grains, plenty of fruits and vegetables, and low-fat dairy products.  Avoid consuming too much caffeine.  Avoid the use of alcohol.  Drink enough fluid to keep your urine pale yellow. Lifestyle  Change situations that cause you stress. Try to keep your work and personal schedule in balance.  Do not use any products that contain nicotine or tobacco, such as cigarettes and e-cigarettes. If you need help quitting, ask your health care provider.  Do not use drugs. Contact a health care provider if:  Your fatigue does not get better.  You have a fever.  You suddenly lose or gain weight.  You have headaches.  You have trouble falling asleep or sleeping through the night.  You feel angry, guilty, anxious, or sad.  You are unable to have a bowel movement (constipation).  Your skin is dry.  You have swelling in your legs or another part of your body. Get help right away if:  You feel confused.  Your vision is blurry.  You feel faint or you pass out.  You have a severe headache.  You have severe pain in your abdomen, your back, or the area between your waist and hips (pelvis).  You have chest  pain, shortness of breath, or an irregular or fast heartbeat.  You are unable to urinate, or you urinate less than normal.  You have abnormal bleeding, such as bleeding from the rectum, vagina, nose, lungs, or nipples.  You vomit blood.  You have thoughts about hurting yourself or others. If you ever feel like you may hurt yourself or others, or have thoughts about taking your own life, get help right away. You can go to your nearest emergency department or call:  Your local emergency services (911 in the [place]  A suicide crisis  helpline, such as the [place] at (610)835-6356. This is open 24 hours a day. Summary  If you have fatigue, you feel tired all the time and have a lack of energy or a lack of motivation.  Fatigue may make it difficult to start or complete tasks because of exhaustion.  Long-lasting (chronic) or extreme fatigue may be a symptom of a medical condition.  Exercise regularly, as told by your health care provider.  Change situations that cause you stress. Try to keep your work and personal schedule in balance. This information is not intended to replace advice given to you by your health care provider. Make sure you discuss any questions you have with your health care provider. Document Revised: 03/13/2019 Document Reviewed: 05/17/2017 Elsevier Patient Education  [employer].    If you have lab work done today you will be contacted with your lab results within the next 2 weeks.  If you have not heard from [place] then please contact us. The fastest way to get your results is to register for My Chart.   IF you received an x-ray today, you will receive an invoice from [facility]. Please contact [facility] at [phone] with questions or concerns regarding your invoice.   IF you received labwork today, you will receive an invoice from [place]. Please contact [facility] at [phone] with questions or concerns regarding your invoice.   Our billing staff will not be able to assist you with questions regarding bills from these companies.  You will be contacted with the lab results as soon as they are available. The fastest way to get your results is to activate your My Chart account. Instructions are located on the last page of this paperwork. If you have not heard from [place] regarding the results in 2 weeks, please contact this office.

## 2020-07-13 NOTE — Progress Notes (Signed)
Subjective:  Patient ID: Gina Pace, female    DOB: 08/27/1925  Age: 86 y.o. MRN: [id number]  CC:  Chief Complaint  Patient presents with  . Follow-up    on hypertension and bilateral leg edima. Pt reports no issues at home that she is aware of pt doesn't check her BP at home, but pt gets head aches and blury vission at times. pt states this may be from her recent eye doctor appt where that provider put lots of stuff in her eyes.. Pt reports no change in her legs. pt reports she saw the specalist reguarding this and was told she has normal blood flow.    HPI Gina Pace presents for  Hypertension: Follow-up from August 4.  Amlodipine 7.5 mg, HCTZ 6.25 mg daily at that time with option of 12.5 mg.  Home readings from 120s to 140s over 60s to 70s.  Suspect a component of whitecoat hypertension, continued same regimen. Stopped taking hctz after felt bad from 2nd covid vaccine in August. Would like ot take amlodipine 91m BID.  No recent home readings.  Cardiology visit October 25.  Home monitoring of blood pressure with option of higher dose amlodipine.  Moderate aortic and mitral regurgitation, repeat echo planned. Vascular visit September 21.  Deep and superficial venous reflux.  Not a candidate for laser ablation.  Thigh-high 20 to 30 mm compression stockings were recommended.  Option of 15 to 20 mm knee-high compression if unable to tolerate.  Leg elevation. Only using thigh high stockings at home - not if leaving house. Feel too tight. Uses her old ones.  Legs are fine in am, start to swell after shower.   Episodic HA, blurry vision - noted since optho visit lat week. Has had problems few days in past - lasts longer this time, glare, some headache.  No focal weakness, no slurred speech.  Some fatigue after covid vaccine in August. No chest pain, no focal weakness.  Tired after activity.  Occasional skipped beat feeling.  Lightheaded at times - rarely, notes after looking  at phone.  Hx pac, pvc on prior ekg.   BP Readings from Last 3 Encounters:  07/13/20 (!) 148/86  06/29/20 (!) 160/62  05/26/20 (!) 160/80   Lab Results  Component Value Date   CREATININE 0.78 07/13/2020   Peripheral edema: See previous notes, suspected component of stasis dermatitis.  Less likely cellulitis.  Edema/erythema improves overnight.  Leg elevation, light intensity compression stockings and vascular surgery follow-up discussed.  History Patient Active Problem List   Diagnosis Date Noted  . Closed fracture of proximal end of left humerus 02/19/2020  . Pain in joint of left shoulder 01/22/2020  . Elevated blood pressure reading without diagnosis of hypertension 02/14/2019  . Dizziness and giddiness 02/14/2019   Past Medical History:  Diagnosis Date  . Allergy   . Cataract   . GI bleed   . Hypertension 02/2019   Newly diagnosed   . Osteoporosis   . Tuberculosis   . Ulcer of gastric fundus, acute    Past Surgical History:  Procedure Laterality Date  . ABDOMINAL HYSTERECTOMY    . APPENDECTOMY    . Bladder     Bladder tack  . EYE SURGERY    . Rectum     Rectal tear repair   Allergies  Allergen Reactions  . Chocolate Anaphylaxis    Migraines   . Prednisone Hypertension  . Shellfish Allergy   . Chocolate Flavor   .  Clindamycin   . Clindamycin/Lincomycin   . Codeine   . Erythromycin   . Erythromycin Base   . Lisinopril     Angioedema   . Other   . Penicillins   . Sulfa Antibiotics    Prior to Admission medications   Medication Sig Start Date End Date Taking? Authorizing Provider  amLODipine (NORVASC) 2.5 MG tablet TAKE 1 TABLET BY MOUTH TWICE A DAY 03/10/20  Yes Wendie Agreste, MD  amLODipine (NORVASC) 5 MG tablet Take 5 mg by mouth 2 (two) times daily. 01/29/20  Yes [provider]   Social History   Socioeconomic History  . Marital status: Widowed    Spouse name: Not on file  . Number of children: 3  . Years of education: Not on  file  . Highest education level: Not on file  Occupational History  . Occupation: Clinical cytogeneticist    Comment: Retired   . Occupation: Higher education careers adviser     Comment: Retired   Tobacco Use  . Smoking status: Never Smoker  . Smokeless tobacco: Never Used  Vaping Use  . Vaping Use: Never used  Substance and Sexual Activity  . Alcohol use: Yes    Comment: 1 Glass of Scotch a Night  . Drug use: Never  . Sexual activity: Not Currently  Other Topics Concern  . Not on file  Social History Narrative   Has 3 children 2 daughters and a son, who are very attentive.    Lives alone in a townhouse.    Able to perform all ADL's   Exercises daily   Belongs to social clubs   Does line dancing.    Social Determinants of Health   Financial Resource Strain:   . Difficulty of Paying Living Expenses: Not on file  Food Insecurity:   . Worried About Charity fundraiser in the Last Year: Not on file  . Ran Out of Food in the Last Year: Not on file  Transportation Needs:   . Lack of Transportation (Medical): Not on file  . Lack of Transportation (Non-Medical): Not on file  Physical Activity:   . Days of Exercise per Week: Not on file  . Minutes of Exercise per Session: Not on file  Stress:   . Feeling of Stress : Not on file  Social Connections:   . Frequency of Communication with Friends and Family: Not on file  . Frequency of Social Gatherings with Friends and Family: Not on file  . Attends Religious Services: Not on file  . Active Member of Clubs or Organizations: Not on file  . Attends Archivist Meetings: Not on file  . Marital Status: Not on file  Intimate Partner Violence:   . Fear of Current or Ex-Partner: Not on file  . Emotionally Abused: Not on file  . Physically Abused: Not on file  . Sexually Abused: Not on file    Review of Systems Per HPI.   Objective:   Vitals:   07/13/20 1511 07/13/20 1516  BP: (!) 149/89 (!) 148/86  Pulse: 83   Temp: 98.2 F (36.8 C)     TempSrc: Temporal   SpO2: 96%   Weight: 119 lb (54 kg)   Height: 5' (1.524 m)      Physical Exam Vitals reviewed.  Constitutional:      Appearance: She is well-developed.  HENT:     Head: Normocephalic and atraumatic.  Eyes:     Conjunctiva/sclera: Conjunctivae normal.     Pupils:  Pupils are equal, round, and reactive to light.  Neck:     Vascular: No carotid bruit.  Cardiovascular:     Rate and Rhythm: Normal rate and regular rhythm.     Heart sounds: Normal heart sounds.     Comments: Episodic ectobic beat, regular rate.  Pulmonary:     Effort: Pulmonary effort is normal.     Breath sounds: Normal breath sounds.  Abdominal:     Palpations: Abdomen is soft. There is no pulsatile mass.     Tenderness: There is no abdominal tenderness.  Musculoskeletal:     Right lower leg: Edema (1+ R greater than left. ) present.     Left lower leg: Edema present.  Skin:    General: Skin is warm and dry.  Neurological:     General: No focal deficit present.     Mental Status: She is alert and oriented to person, place, and time.     Comments: Equal grip strength, equal facial movements, nonfocal.  Speaking in full sentences without distress.  Psychiatric:        Mood and Affect: Mood normal.        Behavior: Behavior normal.        Thought Content: Thought content normal.    EKG: None with rate.  Sinus rhythm, rate variation, overall rate 67.  Right bundle branch block.  No apparent changes from 06/29/2020.  Assessment & Plan:  Gina Pace is a 86 y.o. female . Fatigue, unspecified type - Plan: CBC, Basic metabolic panel, EKG 32-KGUR  -No acute findings on EKG, nonfocal exam.  Check CBC, BMP.  Can try adjusting blood pressure regimen but RTC precautions given if worsening fatigue or new symptoms.  Peripheral edema  -Overall stable, recommendations from vascular specialist were discussed with compression stockings  Essential hypertension - Plan: amLODipine (NORVASC) 5 MG  tablet, EKG 12-Lead  -She would like to try twice daily dosing of amlodipine at 5 mg, atypical dosing schedule discussed but she has done well previously with twice daily dosing.  Will try 5 mg twice daily, RTC precautions.  Nonintractable episodic headache, unspecified headache type Vision changes  -Episodic headache with intermittent blurring as above.  Some of her fatigue or dizziness has been after she has been looking at her phone or reading, asked that she call her ophthalmologist for quick follow-up and evaluation with ER precautions. Meds ordered this encounter  Medications  . amLODipine (NORVASC) 5 MG tablet    Sig: Take 1 tablet (5 mg total) by mouth in the morning and at bedtime.    Dispense:  60 tablet    Refill:  2   Patient Instructions   Keep a record of your blood pressures outside of the office and if remaining over 140/90 - let me know and we can discuss more med changes.  For now change to amlodipine 55m twice per day.   The vascular specialist did recommend the thigh-high compression stockings as much as possible but if you are unable to tolerate those because they were too tight they did discuss the lower intensity compression stockings for the knees down.  I would recommend talking to their office about what may be the best compression stockings for you to use throughout the day.   Call your eye doctor today to be seen as soon as possible due to your continued symptoms of headache and blurry vision. I would like you seen tomorrow if possible.   I will check some labs for  fatigue but if that is not improving with change in blood pressure medicines, be seen in the next week to 10 days.  Return to the clinic or go to the nearest emergency room if any of your symptoms worsen or new symptoms occur.   Fatigue If you have fatigue, you feel tired all the time and have a lack of energy or a lack of motivation. Fatigue may make it difficult to start or complete tasks because of  exhaustion. In general, occasional or mild fatigue is often a normal response to activity or life. However, long-lasting (chronic) or extreme fatigue may be a symptom of a medical condition. Follow these instructions at home: General instructions  Watch your fatigue for any changes.  Go to bed and get up at the same time every day.  Avoid fatigue by pacing yourself during the day and getting enough sleep at night.  Maintain a healthy weight. Medicines  Take over-the-counter and prescription medicines only as told by your health care provider.  Take a multivitamin, if told by your health care provider.  Do not use herbal or dietary supplements unless they are approved by your health care provider. Activity   Exercise regularly, as told by your health care provider.  Use or practice techniques to help you relax, such as yoga, tai chi, meditation, or massage therapy. Eating and drinking   Avoid heavy meals in the evening.  Eat a well-balanced diet, which includes lean proteins, whole grains, plenty of fruits and vegetables, and low-fat dairy products.  Avoid consuming too much caffeine.  Avoid the use of alcohol.  Drink enough fluid to keep your urine pale yellow. Lifestyle  Change situations that cause you stress. Try to keep your work and personal schedule in balance.  Do not use any products that contain nicotine or tobacco, such as cigarettes and e-cigarettes. If you need help quitting, ask your health care provider.  Do not use drugs. Contact a health care provider if:  Your fatigue does not get better.  You have a fever.  You suddenly lose or gain weight.  You have headaches.  You have trouble falling asleep or sleeping through the night.  You feel angry, guilty, anxious, or sad.  You are unable to have a bowel movement (constipation).  Your skin is dry.  You have swelling in your legs or another part of your body. Get help right away if:  You feel  confused.  Your vision is blurry.  You feel faint or you pass out.  You have a severe headache.  You have severe pain in your abdomen, your back, or the area between your waist and hips (pelvis).  You have chest pain, shortness of breath, or an irregular or fast heartbeat.  You are unable to urinate, or you urinate less than normal.  You have abnormal bleeding, such as bleeding from the rectum, vagina, nose, lungs, or nipples.  You vomit blood.  You have thoughts about hurting yourself or others. If you ever feel like you may hurt yourself or others, or have thoughts about taking your own life, get help right away. You can go to your nearest emergency department or call:  Your local emergency services (911 in the [place]  A suicide crisis helpline, such as the [facility] at (956)536-9605. This is open 24 hours a day. Summary  If you have fatigue, you feel tired all the time and have a lack of energy or a lack of motivation.  Fatigue  may make it difficult to start or complete tasks because of exhaustion.  Long-lasting (chronic) or extreme fatigue may be a symptom of a medical condition.  Exercise regularly, as told by your health care provider.  Change situations that cause you stress. Try to keep your work and personal schedule in balance. This information is not intended to replace advice given to you by your health care provider. Make sure you discuss any questions you have with your health care provider. Document Revised: 03/13/2019 Document Reviewed: 05/17/2017 Elsevier Patient Education  [employer].    If you have lab work done today you will be contacted with your lab results within the next 2 weeks.  If you have not heard from [place] then please contact us. The fastest way to get your results is to register for My Chart.   IF you received an x-ray today, you will receive an invoice from [facility]. Please contact [facility]  Radiology at [phone] with questions or concerns regarding your invoice.   IF you received labwork today, you will receive an invoice from [place]. Please contact [facility] at [phone] with questions or concerns regarding your invoice.   Our billing staff will not be able to assist you with questions regarding bills from these companies.  You will be contacted with the lab results as soon as they are available. The fastest way to get your results is to activate your My Chart account. Instructions are located on the last page of this paperwork. If you have not heard from [place] regarding the results in 2 weeks, please contact this office.         Signed, Merri Ray, MD [facility]

## 2020-07-14 ENCOUNTER — Encounter: Payer: Self-pay | Admitting: Family Medicine

## 2020-07-14 LAB — CBC
Hematocrit: 41.7 % (ref 34.0–46.6)
Hemoglobin: 13.2 g/dL (ref 11.1–15.9)
MCH: 28.3 pg (ref 26.6–33.0)
MCHC: 31.7 g/dL (ref 31.5–35.7)
MCV: 89 fL (ref 79–97)
Platelets: 263 10*3/uL (ref 150–450)
RBC: 4.67 x10E6/uL (ref 3.77–5.28)
RDW: 13.2 % (ref 11.7–15.4)
WBC: 8.3 10*3/uL (ref 3.4–10.8)

## 2020-07-14 LAB — BASIC METABOLIC PANEL
BUN/Creatinine Ratio: 19 (ref 12–28)
BUN: 15 mg/dL (ref 10–36)
CO2: 26 mmol/L (ref 20–29)
Calcium: 10.6 mg/dL — ABNORMAL HIGH (ref 8.7–10.3)
Chloride: 105 mmol/L (ref 96–106)
Creatinine, Ser: 0.78 mg/dL (ref 0.57–1.00)
GFR calc Af Amer: 76 mL/min/{1.73_m2} (ref >59)
GFR calc non Af Amer: 66 mL/min/{1.73_m2} (ref >59)
Glucose: 102 mg/dL — ABNORMAL HIGH (ref 65–99)
Potassium: 4.4 mmol/L (ref 3.5–5.2)
Sodium: 141 mmol/L (ref 134–144)

## 2020-07-15 DIAGNOSIS — S42242D 4-part fracture of surgical neck of left humerus, subsequent encounter for fracture with routine healing: Secondary | ICD-10-CM | POA: Diagnosis not present

## 2020-07-23 ENCOUNTER — Other Ambulatory Visit (HOSPITAL_COMMUNITY): Payer: Medicare Other

## 2020-08-06 ENCOUNTER — Ambulatory Visit: Payer: Medicare Other | Admitting: Family Medicine

## 2020-08-13 ENCOUNTER — Other Ambulatory Visit: Payer: Self-pay

## 2020-08-13 ENCOUNTER — Telehealth (INDEPENDENT_AMBULATORY_CARE_PROVIDER_SITE_OTHER): Payer: Medicare Other | Admitting: Family Medicine

## 2020-08-13 ENCOUNTER — Encounter: Payer: Self-pay | Admitting: Family Medicine

## 2020-08-13 VITALS — Ht 60.0 in | Wt 119.0 lb

## 2020-08-13 DIAGNOSIS — R519 Headache, unspecified: Secondary | ICD-10-CM | POA: Diagnosis not present

## 2020-08-13 DIAGNOSIS — I10 Essential (primary) hypertension: Secondary | ICD-10-CM

## 2020-08-13 NOTE — Patient Instructions (Addendum)
Try decreasing morning dose of amlodipine to 1/2 pill, continue full pill at night  How to Take Your Blood Pressure Blood pressure is a measurement of how strongly your blood is pressing against the walls of your arteries. Arteries are blood vessels that carry blood from your heart throughout your body. Your health care provider takes your blood pressure at each office visit. You can also take your own blood pressure at home with a blood pressure machine. You may need to take your own blood pressure:  To confirm a diagnosis of high blood pressure (hypertension).  To monitor your blood pressure over time.  To make sure your blood pressure medicine is working. Supplies needed: To take your blood pressure, you will need a blood pressure machine. You can buy a blood pressure machine, or blood pressure monitor, at most drugstores or online. There are several types of home blood pressure monitors. When choosing one, consider the following:  Choose a monitor that has an arm cuff.  Choose a cuff that wraps snugly around your upper arm. You should be able to fit only one finger between your arm and the cuff.  Do not choose a monitor that measures your blood pressure from your wrist or finger. Your health care provider can suggest a reliable monitor that will meet your needs. How to prepare To get the most accurate reading, avoid the following for 30 minutes before you check your blood pressure:  Drinking caffeine.  Drinking alcohol.  Eating.  Smoking.  Exercising. Five minutes before you check your blood pressure:  Empty your bladder.  Sit quietly without talking in a dining chair, rather than in a soft couch or armchair. How to take your blood pressure To check your blood pressure, follow the instructions in the manual that came with your blood pressure monitor. If you have a digital blood pressure monitor, the instructions may be as follows: 1. Sit up straight. 2. Place your feet on  the floor. Do not cross your ankles or legs. 3. Rest your left arm at the level of your heart on a table or desk or on the arm of a chair. 4. Pull up your shirt sleeve. 5. Wrap the blood pressure cuff around the upper part of your left arm, 1 inch (2.5 cm) above your elbow. It is best to wrap the cuff around bare skin. 6. Fit the cuff snugly around your arm. You should be able to place only one finger between the cuff and your arm. 7. Position the cord inside the groove of your elbow. 8. Press the power button. 9. Sit quietly while the cuff inflates and deflates. 10. Read the digital reading on the monitor screen and write it down (record it). 11. Wait 2-3 minutes, then repeat the steps, starting at step 1. What does my blood pressure reading mean? A blood pressure reading consists of a higher number over a lower number. Ideally, your blood pressure should be below 120/80. The first ("top") number is called the systolic pressure. It is a measure of the pressure in your arteries as your heart beats. The second ("bottom") number is called the diastolic pressure. It is a measure of the pressure in your arteries as the heart relaxes. Blood pressure is classified into four stages. The following are the stages for adults who do not have a short-term serious illness or a chronic condition. Systolic pressure and diastolic pressure are measured in a unit called mm Hg. Normal  Systolic pressure: below 166.  Diastolic  pressure: below 80. Elevated  Systolic pressure: 448-185.  Diastolic pressure: below 80. Hypertension stage 1  Systolic pressure: 631-497.  Diastolic pressure: 02-63. Hypertension stage 2  Systolic pressure: 785 or above.  Diastolic pressure: 90 or above. You can have prehypertension or hypertension even if only the systolic or only the diastolic number in your reading is higher than normal. Follow these instructions at home:  Check your blood pressure as often as recommended  by your health care provider.  Take your monitor to the next appointment with your health care provider to make sure: ? That you are using it correctly. ? That it provides accurate readings.  Be sure you understand what your goal blood pressure numbers are.  Tell your health care provider if you are having any side effects from blood pressure medicine. Contact a health care provider if:  Your blood pressure is consistently high. Get help right away if:  Your systolic blood pressure is higher than 180.  Your diastolic blood pressure is higher than 110. This information is not intended to replace advice given to you by your health care provider. Make sure you discuss any questions you have with your health care provider. Document Revised: 08/04/2017 Document Reviewed: 01/29/2016 Elsevier Patient Education  [employer].    If you have lab work done today you will be contacted with your lab results within the next 2 weeks.  If you have not heard from [place] then please contact us. The fastest way to get your results is to register for My Chart.   IF you received an x-ray today, you will receive an invoice from [facility]. Please contact [facility] Radiology at [phone] with questions or concerns regarding your invoice.   IF you received labwork today, you will receive an invoice from [place]. Please contact [facility] at [phone] with questions or concerns regarding your invoice.   Our billing staff will not be able to assist you with questions regarding bills from these companies.  You will be contacted with the lab results as soon as they are available. The fastest way to get your results is to activate your My Chart account. Instructions are located on the last page of this paperwork. If you have not heard from [place] regarding the results in 2 weeks, please contact this office.

## 2020-08-13 NOTE — Progress Notes (Signed)
Virtual Visit via Telephone Note  11:18 AM - no answer on call - left VM that I will try again.    I connected with Gina Pace on 08/13/20 at 11:50 AM by telephone and verified that I am speaking with the correct person using two identifiers. Patient location:home.  My location: office.    I discussed the limitations, risks, security and privacy concerns of performing an evaluation and management service by telephone and the availability of in person appointments. I also discussed with the patient that there may be a patient responsible charge related to this service. The patient expressed understanding and agreed to proceed, consent obtained  Chief complaint:  Chief Complaint  Patient presents with  . Follow-up    On recent medication change for hypertension. Pt checks her BP once in while not every day. Pt reports her home readings of systolic pressure between 604-540 and diastolic 98-11. Pt reports some light headedness when the pt makes sudden movements at times. No other physical symptoms of hypertension at this time. Pt climbs stairs before checking her BP and only rests a moment after going up the stairs.    History of Present Illness: Gina Pace is a 86 y.o. female Presents today for: Chief Complaint  Patient presents with  . Follow-up    On recent medication change for hypertension. Pt checks her BP once in while not every day. Pt reports her home readings of systolic pressure between 914-782 and diastolic 95-62. Pt reports some light headedness when the pt makes sudden movements at times. No other physical symptoms of hypertension at this time. Pt climbs stairs before checking her BP and only rests a moment after going up the stairs.   Hypertension: Discussed that never made to visit with fatigue.  At that time she wanted to try twice daily dosing of amlodipine 5 mg.,  Increased from 7.5 mg total per day.  Previously tried hydrochlorothiazide which she  discontinued. Home readings: 140-158 over 80-90.  Rare lightheadedness with sudden movements only.  Does note home readings as above after climbing stairs. Does ok after 8:30 dose, then some HA about an hour later. Feels a little lightheaded with these symptoms.  Night time dose  BP 122/66 on right side.  this morning. 138/74 on left side.   BP Readings from Last 3 Encounters:  07/13/20 (!) 148/86  06/29/20 (!) 160/62  05/26/20 (!) 160/80   Lab Results  Component Value Date   CREATININE 0.78 07/13/2020      Patient Active Problem List   Diagnosis Date Noted  . Closed fracture of proximal end of left humerus 02/19/2020  . Pain in joint of left shoulder 01/22/2020  . Elevated blood pressure reading without diagnosis of hypertension 02/14/2019  . Dizziness and giddiness 02/14/2019   Past Medical History:  Diagnosis Date  . Allergy   . Cataract   . GI bleed   . Hypertension 02/2019   Newly diagnosed   . Osteoporosis   . Tuberculosis   . Ulcer of gastric fundus, acute    Past Surgical History:  Procedure Laterality Date  . ABDOMINAL HYSTERECTOMY    . APPENDECTOMY    . Bladder     Bladder tack  . EYE SURGERY    . Rectum     Rectal tear repair   Allergies  Allergen Reactions  . Chocolate Anaphylaxis    Migraines   . Prednisone Hypertension  . Shellfish Allergy   . Chocolate Flavor   .  Clindamycin   . Clindamycin/Lincomycin   . Codeine   . Erythromycin   . Erythromycin Base   . Lisinopril     Angioedema   . Other   . Penicillins   . Sulfa Antibiotics    Prior to Admission medications   Medication Sig Start Date End Date Taking? Authorizing Provider  amLODipine (NORVASC) 5 MG tablet Take 1 tablet (5 mg total) by mouth in the morning and at bedtime. 07/13/20  Yes Wendie Agreste, MD   Social History   Socioeconomic History  . Marital status: Widowed    Spouse name: Not on file  . Number of children: 3  . Years of education: Not on file  . Highest  education level: Not on file  Occupational History  . Occupation: Clinical cytogeneticist    Comment: Retired   . Occupation: Higher education careers adviser     Comment: Retired   Tobacco Use  . Smoking status: Never Smoker  . Smokeless tobacco: Never Used  Vaping Use  . Vaping Use: Never used  Substance and Sexual Activity  . Alcohol use: Yes    Comment: 1 Glass of Scotch a Night  . Drug use: Never  . Sexual activity: Not Currently  Other Topics Concern  . Not on file  Social History Narrative   Has 3 children 2 daughters and a son, who are very attentive.    Lives alone in a townhouse.    Able to perform all ADL's   Exercises daily   Belongs to social clubs   Does line dancing.    Social Determinants of Health   Financial Resource Strain: Not on file  Food Insecurity: Not on file  Transportation Needs: Not on file  Physical Activity: Not on file  Stress: Not on file  Social Connections: Not on file  Intimate Partner Violence: Not on file    Observations/Objective: Vitals:   08/13/20 0911  Weight: 119 lb (54 kg)  Height: 5' (1.524 m)   No distress on phone.  Appropriate responses, all questions were answered with understanding of plan expressed.  Assessment and Plan: Essential hypertension  Nonintractable episodic headache, unspecified headache type Improved control but episodic headache with lightheadedness in the morning.  We will try 2.5 mg dosing of amlodipine morning, remain on 5 mg at night, recheck in office in 1 month.  Follow-up sooner if lightheadedness, headaches are not improving with this change or other new or worsening symptoms.  Understanding expressed.  Follow Up Instructions:   1 month in office.  I discussed the assessment and treatment plan with the patient. The patient was provided an opportunity to ask questions and all were answered. The patient agreed with the plan and demonstrated an understanding of the instructions.   The patient was advised to call back or  seek an in-person evaluation if the symptoms worsen or if the condition fails to improve as anticipated.  I provided 11 minutes of non-face-to-face time during this encounter.  Signed,   Merri Ray, MD [facility].  08/13/20

## 2020-08-20 ENCOUNTER — Other Ambulatory Visit: Payer: Self-pay

## 2020-08-20 ENCOUNTER — Ambulatory Visit (HOSPITAL_COMMUNITY): Payer: Medicare Other | Attending: Cardiology

## 2020-08-20 DIAGNOSIS — I059 Rheumatic mitral valve disease, unspecified: Secondary | ICD-10-CM | POA: Insufficient documentation

## 2020-08-20 DIAGNOSIS — I351 Nonrheumatic aortic (valve) insufficiency: Secondary | ICD-10-CM

## 2020-08-20 LAB — ECHOCARDIOGRAM COMPLETE
Area-P 1/2: 3.37 cm2
MV M vel: 5.14 m/s
MV Peak grad: 105.5 mmHg
P 1/2 time: 512 msec
Radius: 0.6 cm
S' Lateral: 3.5 cm

## 2020-08-24 ENCOUNTER — Encounter: Payer: Self-pay | Admitting: *Deleted

## 2020-09-11 ENCOUNTER — Other Ambulatory Visit: Payer: Self-pay

## 2020-09-11 ENCOUNTER — Encounter: Payer: Self-pay | Admitting: Family Medicine

## 2020-09-11 ENCOUNTER — Telehealth (INDEPENDENT_AMBULATORY_CARE_PROVIDER_SITE_OTHER): Payer: Medicare Other | Admitting: Family Medicine

## 2020-09-11 VITALS — Ht 60.0 in | Wt 119.0 lb

## 2020-09-11 DIAGNOSIS — I10 Essential (primary) hypertension: Secondary | ICD-10-CM

## 2020-09-11 MED ORDER — AMLODIPINE BESYLATE 2.5 MG PO TABS: 2.5000 mg | ORAL_TABLET | Freq: Every morning | ORAL | 1 refills | Status: DC

## 2020-09-11 NOTE — Progress Notes (Signed)
Virtual Visit via Telephone Note  Called pt- no answer, left vm that I will call  I connected with Gina Pace on 09/11/20 at 2:17 by telephone and verified that I am speaking with the correct person using two identifiers. Patient location: home My location: office   I discussed the limitations, risks, security and privacy concerns of performing an evaluation and management service by telephone and the availability of in person appointments. I also discussed with the patient that there may be a patient responsible charge related to this service. The patient expressed understanding and agreed to proceed, consent obtained  Chief complaint:  Chief Complaint  Patient presents with  . Follow-up    On hypertension. Pt reports since recent change to medication last OV. Pt has been doing well. Pt states her home BP readings have been running around 130/74. Pt reports no physical symptoms of hypertension since last OV.     History of Present Illness:  Hypertension: Last discussed over telemedicine visit December 9.  Some borderline/elevated readings on amlodipine 5 mg twice daily at that time.  Episodic headache in the morning after dose of medication, as well as lightheadedness.  Decreased her to 2.5 mg in the morning, 5 mg at night. Reports home readings have been running around 130/74 on current regimen. Feels well on current regimen - no HA/lightheadedness.  Home readings: 132/66-142/79.  BP Readings from Last 3 Encounters:  07/13/20 (!) 148/86  06/29/20 (!) 160/62  05/26/20 (!) 160/80   Lab Results  Component Value Date   CREATININE 0.78 07/13/2020   Constitutional: Negative for fatigue and unexpected weight change.  Eyes: Negative for new visual disturbance. Has ongoing optho care. preservision for Macular degeneration.  Respiratory: Negative for cough, chest tightness and shortness of breath.   Cardiovascular: Negative for chest pain, palpitations and no new leg  swelling.  Gastrointestinal: Negative for abdominal pain and blood in stool.  Neurological: Negative for dizziness, light-headedness and no new headaches - improved.      Patient Active Problem List   Diagnosis Date Noted  . Closed fracture of proximal end of left humerus 02/19/2020  . Pain in joint of left shoulder 01/22/2020  . Elevated blood pressure reading without diagnosis of hypertension 02/14/2019  . Dizziness and giddiness 02/14/2019   Past Medical History:  Diagnosis Date  . Allergy   . Cataract   . GI bleed   . Hypertension 02/2019   Newly diagnosed   . Osteoporosis   . Tuberculosis   . Ulcer of gastric fundus, acute    Past Surgical History:  Procedure Laterality Date  . ABDOMINAL HYSTERECTOMY    . APPENDECTOMY    . Bladder     Bladder tack  . EYE SURGERY    . Rectum     Rectal tear repair   Allergies  Allergen Reactions  . Chocolate Anaphylaxis    Migraines   . Prednisone Hypertension  . Shellfish Allergy   . Chocolate Flavor   . Clindamycin   . Clindamycin/Lincomycin   . Codeine   . Erythromycin   . Erythromycin Base   . Lisinopril     Angioedema   . Other   . Penicillins   . Sulfa Antibiotics    Prior to Admission medications   Medication Sig Start Date End Date Taking? Authorizing Provider  amLODipine (NORVASC) 5 MG tablet Take 1 tablet (5 mg total) by mouth in the morning and at bedtime. 07/13/20  Yes Wendie Agreste, MD  Social History   Socioeconomic History  . Marital status: Widowed    Spouse name: Not on file  . Number of children: 3  . Years of education: Not on file  . Highest education level: Not on file  Occupational History  . Occupation: Clinical cytogeneticist    Comment: Retired   . Occupation: Higher education careers adviser     Comment: Retired   Tobacco Use  . Smoking status: Never Smoker  . Smokeless tobacco: Never Used  Vaping Use  . Vaping Use: Never used  Substance and Sexual Activity  . Alcohol use: Yes    Comment: 1 Glass of  Scotch a Night  . Drug use: Never  . Sexual activity: Not Currently  Other Topics Concern  . Not on file  Social History Narrative   Has 3 children 2 daughters and a son, who are very attentive.    Lives alone in a townhouse.    Able to perform all ADL's   Exercises daily   Belongs to social clubs   Does line dancing.    Social Determinants of Health   Financial Resource Strain: Not on file  Food Insecurity: Not on file  Transportation Needs: Not on file  Physical Activity: Not on file  Stress: Not on file  Social Connections: Not on file  Intimate Partner Violence: Not on file     Observations/Objective: Vitals:   09/11/20 1200  Weight: 119 lb (54 kg)  Height: 5' (1.524 m)   No distress on phone, coherent responses, all questions answered. Understanding expressed.   Assessment and Plan: Essential hypertension - Plan: amLODipine (NORVASC) 2.5 MG tablet  - stable on current regimen - amlodipine 2.5 qam, 5mg  qpm.   - in office eval in 4 months with labs.   Follow Up Instructions:  as above.    I discussed the assessment and treatment plan with the patient. The patient was provided an opportunity to ask questions and all were answered. The patient agreed with the plan and demonstrated an understanding of the instructions.   The patient was advised to call back or seek an in-person evaluation if the symptoms worsen or if the condition fails to improve as anticipated.  I provided 12 minutes of non-face-to-face time during this encounter.  Signed,   Merri Ray, MD [facility].  09/11/20

## 2020-09-11 NOTE — Patient Instructions (Addendum)
° ° ° °  If you have lab work done today you will be contacted with your lab results within the next 2 weeks.  If you have not heard from us then please contact us. The fastest way to get your results is to register for My Chart. ° ° °IF you received an x-ray today, you will receive an invoice from [facility]. Please contact [facility] at [phone] with questions or concerns regarding your invoice.  ° °IF you received labwork today, you will receive an invoice from [facility]. Please contact [facility] at [phone] with questions or concerns regarding your invoice.  ° °Our billing staff will not be able to assist you with questions regarding bills from these companies. ° °You will be contacted with the lab results as soon as they are available. The fastest way to get your results is to activate your My Chart account. Instructions are located on the last page of this paperwork. If you have not heard from us regarding the results in 2 weeks, please contact this office. °  ° ° ° °

## 2020-10-04 DIAGNOSIS — Z20822 Contact with and (suspected) exposure to covid-19: Secondary | ICD-10-CM | POA: Diagnosis not present

## 2020-10-05 ENCOUNTER — Other Ambulatory Visit: Payer: Medicare Other

## 2020-11-25 ENCOUNTER — Encounter: Payer: Self-pay | Admitting: Family Medicine

## 2020-11-25 ENCOUNTER — Other Ambulatory Visit: Payer: Self-pay

## 2020-11-25 ENCOUNTER — Ambulatory Visit (INDEPENDENT_AMBULATORY_CARE_PROVIDER_SITE_OTHER): Payer: Medicare Other | Admitting: Family Medicine

## 2020-11-25 DIAGNOSIS — I10 Essential (primary) hypertension: Secondary | ICD-10-CM | POA: Diagnosis not present

## 2020-11-25 MED ORDER — AMLODIPINE BESYLATE 2.5 MG PO TABS: 2.5000 mg | ORAL_TABLET | Freq: Every morning | ORAL | 1 refills | Status: DC

## 2020-11-25 MED ORDER — AMLODIPINE BESYLATE 5 MG PO TABS: 5.0000 mg | ORAL_TABLET | Freq: Two times a day (BID) | ORAL | 2 refills | Status: DC

## 2020-11-25 NOTE — Patient Instructions (Addendum)
  Continue same medication at this time.  I will recheck some electrolytes as well as a calcium and parathyroid hormone test to evaluate the elevated calcium level from last visit.  I suspect that will be okay.  If any new symptoms including new heart palpitations or worsening of the skipped beats, make sure to be seen right away.  Otherwise follow-up with me in 6 months.   Return to the clinic or go to the nearest emergency room if any of your symptoms worsen or new symptoms occur.  Here are a few lab drawing stations for you to have your labwork performed:  [place], [place], [place] [address]  [place], [place] [address]  Here is my new office: [facility] Address: [address] [place], [place],  [address] Phone: [phone]    If you have lab work done today you will be contacted with your lab results within the next 2 weeks.  If you have not heard from [place] then please contact us. The fastest way to get your results is to register for My Chart.   IF you received an x-ray today, you will receive an invoice from [facility]. Please contact [facility] at [phone] with questions or concerns regarding your invoice.   IF you received labwork today, you will receive an invoice from [facility]. Please contact [facility] at [phone] with questions or concerns regarding your invoice.   Our billing staff will not be able to assist you with questions regarding bills from these companies.  You will be contacted with the lab results as soon as they are available. The fastest way to get your results is to activate your My Chart account. Instructions are located on the last page of this paperwork. If you have not heard from [place] regarding the results in 2 weeks, please contact this office.

## 2020-11-25 NOTE — Progress Notes (Signed)
Subjective:  Patient ID: Gina Pace, female    DOB: Dec 27, 1924  Age: 87 y.o. MRN: [id number]  CC:  Chief Complaint  Patient presents with  . Follow-up    On hypertension and blood work. Pt reports no issues she has noticed with her BP since last OV. Pt reports not checking BP at home due to it making her nervous and getting high readings due to the nerves. PT reports current medication seems to work well with no side effects.    HPI Gina Pace presents for   Hypertension: Variable control previously with some adjustments in amlodipine.  Ultimately regimen that has worked has been 2.5 mg amlodipine in the morning, 5 mg at night. Home readings: none.  No new side effects of meds. Rare leg swelling, not daily. Rare sinus headache.  Rare feeling of skipped beat/extra beat. No associated CP/dyspena. Noted for years. Takes a breath and it resolves. Few times per week, not daily.  SR, with ectopic beats noted on EKG in 07/2020.  No hx of HLD. Some persistent nocturia- 3 times per night. Tried meds years ago, did not tolerate. Stable, declines new treatments/workup at this time.   BP Readings from Last 3 Encounters:  11/25/20 132/78  07/13/20 (!) 148/86  06/29/20 (!) 160/62   Lab Results  Component Value Date   CREATININE 0.78 07/13/2020    Hypercalcemia Mild elevation past few visits, 10.6 on 07/13/2020.  She did have a reading of 10.4 in June 2020, then normalized, followed by a slight increase in January of last year.  No recent PTH level. No calcium supplements. In foods only.   Declines pneumonia vaccination.  History Patient Active Problem List   Diagnosis Date Noted  . Closed fracture of proximal end of left humerus 02/19/2020  . Pain in joint of left shoulder 01/22/2020  . Essential hypertension 02/14/2019  . Dizziness and giddiness 02/14/2019   Past Medical History:  Diagnosis Date  . Allergy   . Cataract   . GI bleed   . Hypertension 02/2019    Newly diagnosed   . Osteoporosis   . Tuberculosis   . Ulcer of gastric fundus, acute    Past Surgical History:  Procedure Laterality Date  . ABDOMINAL HYSTERECTOMY    . APPENDECTOMY    . Bladder     Bladder tack  . EYE SURGERY    . Rectum     Rectal tear repair   Allergies  Allergen Reactions  . Chocolate Anaphylaxis    Migraines   . Prednisone Hypertension  . Shellfish Allergy   . Chocolate Flavor   . Clindamycin   . Clindamycin/Lincomycin   . Codeine   . Erythromycin   . Erythromycin Base   . Lisinopril     Angioedema   . Other   . Penicillins   . Sulfa Antibiotics    Prior to Admission medications   Medication Sig Start Date End Date Taking? Authorizing Provider  amLODipine (NORVASC) 2.5 MG tablet Take 1 tablet (2.5 mg total) by mouth in the morning. 09/11/20  Yes Wendie Agreste, MD  amLODipine (NORVASC) 5 MG tablet Take 1 tablet (5 mg total) by mouth in the morning and at bedtime. 07/13/20  Yes Wendie Agreste, MD   Social History   Socioeconomic History  . Marital status: Widowed    Spouse name: Not on file  . Number of children: 3  . Years of education: Not on file  . Highest education level:  Not on file  Occupational History  . Occupation: Clinical cytogeneticist    Comment: Retired   . Occupation: Higher education careers adviser     Comment: Retired   Tobacco Use  . Smoking status: Never Smoker  . Smokeless tobacco: Never Used  Vaping Use  . Vaping Use: Never used  Substance and Sexual Activity  . Alcohol use: Yes    Comment: 1 Glass of Scotch a Night  . Drug use: Never  . Sexual activity: Not Currently  Other Topics Concern  . Not on file  Social History Narrative   Has 3 children 2 daughters and a son, who are very attentive.    Lives alone in a townhouse.    Able to perform all ADL's   Exercises daily   Belongs to social clubs   Does line dancing.    Social Determinants of Health   Financial Resource Strain: Not on file  Food Insecurity: Not on file   Transportation Needs: Not on file  Physical Activity: Not on file  Stress: Not on file  Social Connections: Not on file  Intimate Partner Violence: Not on file    Review of Systems  Constitutional: Negative for fatigue and unexpected weight change.  Respiratory: Negative for chest tightness and shortness of breath.   Cardiovascular: Negative for chest pain, palpitations and leg swelling.  Gastrointestinal: Negative for abdominal pain and blood in stool.  Neurological: Negative for dizziness, syncope, light-headedness and headaches.     Objective:   Vitals:   11/25/20 1040 11/25/20 1043  BP: (!) 167/70 132/78  Pulse: 83   Temp: (!) 97.3 F (36.3 C)   TempSrc: Temporal   SpO2: 99%   Weight: 116 lb (52.6 kg)   Height: 5' (1.524 m)      Physical Exam Vitals reviewed.  Constitutional:      Appearance: She is well-developed.  HENT:     Head: Normocephalic and atraumatic.  Eyes:     Conjunctiva/sclera: Conjunctivae normal.     Pupils: Pupils are equal, round, and reactive to light.  Neck:     Vascular: No carotid bruit.  Cardiovascular:     Rate and Rhythm: Normal rate.     Heart sounds: Normal heart sounds.     Comments: Regular rhythm, rate with episodic ectopic beat, approximately every 5-6 beats. Pulmonary:     Effort: Pulmonary effort is normal.     Breath sounds: Normal breath sounds.  Abdominal:     Palpations: Abdomen is soft. There is no pulsatile mass.     Tenderness: There is no abdominal tenderness.  Skin:    General: Skin is warm and dry.  Neurological:     Mental Status: She is alert and oriented to person, place, and time.  Psychiatric:        Mood and Affect: Mood normal.        Behavior: Behavior normal.     Assessment & Plan:  Gina Pace is a 87 y.o. female . Hypercalcemia - Plan: PTH, Intact and Calcium  Essential hypertension - Plan: amLODipine (NORVASC) 2.5 MG tablet, amLODipine (NORVASC) 5 MG tablet, Basic metabolic  panel  Hypertension well-controlled on current regimen, will continue same.  Mild hypercalcemia, check PTH with BMP.  67-month follow-up.  Meds ordered this encounter  Medications  . amLODipine (NORVASC) 2.5 MG tablet    Sig: Take 1 tablet (2.5 mg total) by mouth in the morning.    Dispense:  90 tablet    Refill:  1  should be taking 2.5mg  tab QAM, and has 5mg  tab for evening  . amLODipine (NORVASC) 5 MG tablet    Sig: Take 1 tablet (5 mg total) by mouth in the morning and at bedtime.    Dispense:  60 tablet    Refill:  2   Patient Instructions    Continue same medication at this time.  I will recheck some electrolytes as well as a calcium and parathyroid hormone test to evaluate the elevated calcium level from last visit.  I suspect that will be okay.  If any new symptoms including new heart palpitations or worsening of the skipped beats, make sure to be seen right away.  Otherwise follow-up with me in 6 months.   Return to the clinic or go to the nearest emergency room if any of your symptoms worsen or new symptoms occur.  Here are a few lab drawing stations for you to have your labwork performed:  [place], [place], [place] [address]  [place], [place] [address]  Here is my new office: [facility] Address: [address] [place], [place], [place] [address] Phone: [phone]    If you have lab work done today you will be contacted with your lab results within the next 2 weeks.  If you have not heard from [place] then please contact us. The fastest way to get your results is to register for My Chart.   IF you received an x-ray today, you will receive an invoice from [facility]. Please contact [facility] at [phone] with questions or concerns regarding your invoice.   IF you received labwork today, you will receive an invoice from [facility]. Please contact [facility] at [phone] with questions or  concerns regarding your invoice.   Our billing staff will not be able to assist you with questions regarding bills from these companies.  You will be contacted with the lab results as soon as they are available. The fastest way to get your results is to activate your My Chart account. Instructions are located on the last page of this paperwork. If you have not heard from [place] regarding the results in 2 weeks, please contact this office.         Signed, Merri Ray, MD [facility]

## 2020-12-04 DIAGNOSIS — H0014 Chalazion left upper eyelid: Secondary | ICD-10-CM | POA: Diagnosis not present

## 2020-12-07 ENCOUNTER — Other Ambulatory Visit: Payer: Self-pay | Admitting: Family Medicine

## 2020-12-07 DIAGNOSIS — I10 Essential (primary) hypertension: Secondary | ICD-10-CM | POA: Diagnosis not present

## 2020-12-08 LAB — BASIC METABOLIC PANEL
BUN/Creatinine Ratio: 17 (ref 12–28)
BUN: 15 mg/dL (ref 10–36)
CO2: 23 mmol/L (ref 20–29)
Calcium: 10.6 mg/dL — ABNORMAL HIGH (ref 8.7–10.3)
Chloride: 102 mmol/L (ref 96–106)
Creatinine, Ser: 0.89 mg/dL (ref 0.57–1.00)
Glucose: 94 mg/dL (ref 65–99)
Potassium: 4.8 mmol/L (ref 3.5–5.2)
Sodium: 140 mmol/L (ref 134–144)
eGFR: 60 mL/min/{1.73_m2} (ref >59)

## 2020-12-08 LAB — PTH, INTACT AND CALCIUM: PTH: 74 pg/mL — ABNORMAL HIGH (ref 15–65)

## 2021-02-25 ENCOUNTER — Telehealth: Payer: Self-pay

## 2021-02-25 ENCOUNTER — Ambulatory Visit (INDEPENDENT_AMBULATORY_CARE_PROVIDER_SITE_OTHER): Payer: Medicare Other | Admitting: Family Medicine

## 2021-02-25 ENCOUNTER — Encounter: Payer: Self-pay | Admitting: Family Medicine

## 2021-02-25 ENCOUNTER — Other Ambulatory Visit: Payer: Self-pay

## 2021-02-25 VITALS — Temp 97.9°F | Resp 16 | Ht 60.0 in | Wt 113.4 lb

## 2021-02-25 DIAGNOSIS — R0982 Postnasal drip: Secondary | ICD-10-CM | POA: Diagnosis not present

## 2021-02-25 DIAGNOSIS — E210 Primary hyperparathyroidism: Secondary | ICD-10-CM

## 2021-02-25 DIAGNOSIS — R0981 Nasal congestion: Secondary | ICD-10-CM

## 2021-02-25 DIAGNOSIS — I10 Essential (primary) hypertension: Secondary | ICD-10-CM

## 2021-02-25 DIAGNOSIS — I499 Cardiac arrhythmia, unspecified: Secondary | ICD-10-CM

## 2021-02-25 DIAGNOSIS — H6122 Impacted cerumen, left ear: Secondary | ICD-10-CM

## 2021-02-25 MED ORDER — IPRATROPIUM BROMIDE 0.06 % NA SOLN: 1.0000 | Freq: Four times a day (QID) | NASAL | 5 refills | Status: DC

## 2021-02-25 NOTE — Telephone Encounter (Signed)
Can stop any calcium supplement, but still recommend endocrinology follow up as elevated PTH.

## 2021-02-25 NOTE — Patient Instructions (Addendum)
Blood pressure looks ok today. If readings are persistently elevated or dropping low - be seen right away. No med changes today.   Can try new nasal spray for congestion/drainage. That may help with sinus pressure and ear symptoms as well. If new nasal spray is not tolerated, I recommend meeting with ENT specialist.  Let me know and I can place a referral if needed.   Air purifier in home may help with dust particles as well.   Left ear does have some earwax. We can clean that here if needed. Keep follow up with audiologist.  I do recommend meeting with audiologist to check the fit of hearing aids.  Call cardiologist for follow up.   Return to the clinic or go to the nearest emergency room if any of your symptoms worsen or new symptoms occur.    Managing Your Hypertension Hypertension, also called high blood pressure, is when the force of the blood pressing against the walls of the arteries is too strong. Arteries are blood vessels that carry blood from your heart throughout your body. Hypertension forces the heart to work harder to pump blood and may cause the arteries tobecome narrow or stiff. Understanding blood pressure readings Your personal target blood pressure may vary depending on your medical conditions, your age, and other factors. A blood pressure reading includes a higher number over a lower number. Ideally, your blood pressure should be below 120/80. You should know that: The first, or top, number is called the systolic pressure. It is a measure of the pressure in your arteries as your heart beats. The second, or bottom number, is called the diastolic pressure. It is a measure of the pressure in your arteries as the heart relaxes. Blood pressure is classified into four stages. Based on your blood pressure reading, your health care provider may use the following stages to determine what type of treatment you need, if any. Systolic pressure and diastolicpressure are measured in a  unit called mmHg. Normal Systolic pressure: below 024. Diastolic pressure: below 80. Elevated Systolic pressure: 097-353. Diastolic pressure: below 80. Hypertension stage 1 Systolic pressure: 299-242. Diastolic pressure: 68-34. Hypertension stage 2 Systolic pressure: 196 or above. Diastolic pressure: 90 or above. How can this condition affect me? Managing your hypertension is an important responsibility. Over time, hypertension can damage the arteries and decrease blood flow to important parts of the body, including the brain, heart, and kidneys. Having untreated or uncontrolled hypertension can lead to: A heart attack. A stroke. A weakened blood vessel (aneurysm). Heart failure. Kidney damage. Eye damage. Metabolic syndrome. Memory and concentration problems. Vascular dementia. What actions can I take to manage this condition? Hypertension can be managed by making lifestyle changes and possibly by taking medicines. Your health care provider will help you make a plan to bring yourblood pressure within a normal range. Nutrition  Eat a diet that is high in fiber and potassium, and low in salt (sodium), added sugar, and fat. An example eating plan is called the Dietary Approaches to Stop Hypertension (DASH) diet. To eat this way: Eat plenty of fresh fruits and vegetables. Try to fill one-half of your plate at each meal with fruits and vegetables. Eat whole grains, such as whole-wheat pasta, brown rice, or whole-grain bread. Fill about one-fourth of your plate with whole grains. Eat low-fat dairy products. Avoid fatty cuts of meat, processed or cured meats, and poultry with skin. Fill about one-fourth of your plate with lean proteins such as fish, chicken without  skin, beans, eggs, and tofu. Avoid pre-made and processed foods. These tend to be higher in sodium, added sugar, and fat. Reduce your daily sodium intake. Most people with hypertension should eat less than 1,500 mg of sodium a  day.  Lifestyle  Work with your health care provider to maintain a healthy body weight or to lose weight. Ask what an ideal weight is for you. Get at least 30 minutes of exercise that causes your heart to beat faster (aerobic exercise) most days of the week. Activities may include walking, swimming, or biking. Include exercise to strengthen your muscles (resistance exercise), such as weight lifting, as part of your weekly exercise routine. Try to do these types of exercises for 30 minutes at least 3 days a week. Do not use any products that contain nicotine or tobacco, such as cigarettes, e-cigarettes, and chewing tobacco. If you need help quitting, ask your health care provider. Control any long-term (chronic) conditions you have, such as high cholesterol or diabetes. Identify your sources of stress and find ways to manage stress. This may include meditation, deep breathing, or making time for fun activities.  Alcohol use Do not drink alcohol if: Your health care provider tells you not to drink. You are pregnant, may be pregnant, or are planning to become pregnant. If you drink alcohol: Limit how much you use to: 0-1 drink a day for women. 0-2 drinks a day for men. Be aware of how much alcohol is in your drink. In the [place] one drink equals one 12 oz bottle of beer (355 mL), one 5 oz glass of wine (148 mL), or one 1 oz glass of hard liquor (44 mL). Medicines Your health care provider may prescribe medicine if lifestyle changes are not enough to get your blood pressure under control and if: Your systolic blood pressure is 130 or higher. Your diastolic blood pressure is 80 or higher. Take medicines only as told by your health care provider. Follow the directions carefully. Blood pressure medicines must be taken as told by your health care provider. The medicine does not work as well when you skip doses. Skippingdoses also puts you at risk for problems. Monitoring Before you monitor your  blood pressure: Do not smoke, drink caffeinated beverages, or exercise within 30 minutes before taking a measurement. Use the bathroom and empty your bladder (urinate). Sit quietly for at least 5 minutes before taking measurements. Monitor your blood pressure at home as told by your health care provider. To do this: Sit with your back straight and supported. Place your feet flat on the floor. Do not cross your legs. Support your arm on a flat surface, such as a table. Make sure your upper arm is at heart level. Each time you measure, take two or three readings one minute apart and record the results. You may also need to have your blood pressure checked regularly by your healthcare provider. General information Talk with your health care provider about your diet, exercise habits, and other lifestyle factors that may be contributing to hypertension. Review all the medicines you take with your health care provider because there may be side effects or interactions. Keep all visits as told by your health care provider. Your health care provider can help you create and adjust your plan for managing your high blood pressure. Where to find more information National Heart, Lung, and [employer] Institute: https://wilson-eaton.com/ American Heart Association: www.heart.org Contact a health care provider if: You think you are having a reaction to medicines  you have taken. You have repeated (recurrent) headaches. You feel dizzy. You have swelling in your ankles. You have trouble with your vision. Get help right away if: You develop a severe headache or confusion. You have unusual weakness or numbness, or you feel faint. You have severe pain in your chest or abdomen. You vomit repeatedly. You have trouble breathing. These symptoms may represent a serious problem that is an emergency. Do not wait to see if the symptoms will go away. Get medical help right away. Call your local emergency services (911 in the [place]  Do not drive yourself to the hospital. Summary Hypertension is when the force of blood pumping through your arteries is too strong. If this condition is not controlled, it may put you at risk for serious complications. Your personal target blood pressure may vary depending on your medical conditions, your age, and other factors. For most people, a normal blood pressure is less than 120/80. Hypertension is managed by lifestyle changes, medicines, or both. Lifestyle changes to help manage hypertension include losing weight, eating a healthy, low-sodium diet, exercising more, stopping smoking, and limiting alcohol. This information is not intended to replace advice given to you by your health care provider. Make sure you discuss any questions you have with your healthcare provider. Document Revised: 09/27/2019 Document Reviewed: 07/23/2019 Elsevier Patient Education  2022 [employer].

## 2021-02-25 NOTE — Telephone Encounter (Signed)
..  I spoke with the patient about this. Pt is taking Tums and Calcium Caltrate will that make a difference with her blood work from her Calcium   Pt call [phone]

## 2021-02-25 NOTE — Progress Notes (Signed)
Subjective:  Patient ID: Gina Pace, female    DOB: [birth year]  Age: 87 y.o. MRN: [id number]  CC:  Chief Complaint  Patient presents with   Ear Pain    Pt here due to occasional ear pain and some sinus pressure that comes and goes   Hypertension    Pt recheck Bp unsure home readings     HPI Gina Pace presents for   Ear pain: Intermittent use of hearing aids - notices headaches if wearing hearing aids more than 15 minutes. Sometimes lightheaded with wearing hearing aids - plans to make appt with audiologist. Occasional soreness or clogged feeling. Associated with occasional sinus pressure, intermittent.  No discharge.  No fever.   Attempted treatments: none.uses qtips on end only.  Sinus congestion  Takes allegra daily - 1/2 in am, 1/2 at night. Still with PND. Sinus pressure during the day. Uses vicks ointment to face, saline mist 2-3 times per day.  Flonase caused headaches and dizziness. Declines cerumen lavage on left today - will have done at audiology.  Environmental eval of house - no mold. Some dust particles. Does not have air purifier.   Hypertension: Various regimens previously, ultimately has been effective control with 2.5 mg amlodipine morning, 5 mg at night.  Now taking 5mg  in am, 2.5mg  at night past few weeks.  Frequent ectopic beats on EKG in 11/21. Due for cardiology follow up.  Sinus pressure causes lightheadedness. No palpitations or chest pains.  Home readings: variable.  BP Readings from Last 3 Encounters:  11/25/20 132/78  07/13/20 (!) 148/86  06/29/20 (!) 160/62   Lab Results  Component Value Date   CREATININE 0.89 12/07/2020   Hypercalcemia Suspected primary hyperparathyroidism with PTH 74 in April.  Stable but elevated calcium.  Discussed endocrinology eval for possible hyperparathyroidism.no calcium supplements.    History Patient Active Problem List   Diagnosis Date Noted   Closed fracture of proximal end of left humerus  02/19/2020   Pain in joint of left shoulder 01/22/2020   Essential hypertension 02/14/2019   Dizziness and giddiness 02/14/2019   Past Medical History:  Diagnosis Date   Allergy    Cataract    GI bleed    Hypertension 02/2019   Newly diagnosed    Osteoporosis    Tuberculosis    Ulcer of gastric fundus, acute    Past Surgical History:  Procedure Laterality Date   ABDOMINAL HYSTERECTOMY     APPENDECTOMY     Bladder     Bladder tack   EYE SURGERY     Rectum     Rectal tear repair   Allergies  Allergen Reactions   Chocolate Anaphylaxis    Migraines    Prednisone Hypertension   Shellfish Allergy    Chocolate Flavor    Clindamycin    Clindamycin/Lincomycin    Codeine    Erythromycin    Erythromycin Base    Lisinopril     Angioedema    Other    Penicillins    Sulfa Antibiotics    Prior to Admission medications   Medication Sig Start Date End Date Taking? Authorizing Provider  amLODipine (NORVASC) 2.5 MG tablet Take 1 tablet (2.5 mg total) by mouth in the morning. 11/25/20  Yes Wendie Agreste, MD  amLODipine (NORVASC) 5 MG tablet Take 1 tablet (5 mg total) by mouth in the morning and at bedtime. 11/25/20  Yes Wendie Agreste, MD   Social History   Socioeconomic History  Marital status: Widowed    Spouse name: Not on file   Number of children: 3   Years of education: Not on file   Highest education level: Not on file  Occupational History   Occupation: Book Therapist, nutritional    Comment: Retired    Occupation: Higher education careers adviser     Comment: Retired   Tobacco Use   Smoking status: Never   Smokeless tobacco: Never  Scientific laboratory technician Use: Never used  Substance and Sexual Activity   Alcohol use: Yes    Comment: 1 Glass of Scotch a Night   Drug use: Never   Sexual activity: Not Currently  Other Topics Concern   Not on file  Social History Narrative   Has 3 children 2 daughters and a son, who are very attentive.    Lives alone in a townhouse.    Able to  perform all ADL's   Exercises daily   Belongs to social clubs   Does line dancing.    Social Determinants of Health   Financial Resource Strain: Not on file  Food Insecurity: Not on file  Transportation Needs: Not on file  Physical Activity: Not on file  Stress: Not on file  Social Connections: Not on file  Intimate Partner Violence: Not on file    Review of Systems   Objective:   Vitals:   02/25/21 1122  Resp: 16  Temp: 97.9 F (36.6 C)  TempSrc: Temporal  SpO2: 98%  Weight: 113 lb 6.4 oz (51.4 kg)  Height: 5' (1.524 m)     Physical Exam Vitals reviewed.  Constitutional:      Appearance: Normal appearance. She is well-developed.  HENT:     Head: Normocephalic and atraumatic.     Comments: Minimal tenderness over frontal sinuses.  Denies discolored nasal discharge.  Maxillary sinuses nontender, bone lymphadenopathy appreciated. Right ear canal clear, left canal obstructed with cerumen.  No discharge.    Right Ear: Tympanic membrane, ear canal and external ear normal.     Left Ear: External ear normal. There is impacted cerumen.  Eyes:     Conjunctiva/sclera: Conjunctivae normal.     Pupils: Pupils are equal, round, and reactive to light.  Neck:     Vascular: No carotid bruit.  Cardiovascular:     Rate and Rhythm: Normal rate. Rhythm irregular.     Heart sounds: Normal heart sounds.     Comments: Frequent ectopic beats Pulmonary:     Effort: Pulmonary effort is normal.     Breath sounds: Normal breath sounds.  Abdominal:     Palpations: Abdomen is soft. There is no pulsatile mass.     Tenderness: There is no abdominal tenderness.  Musculoskeletal:     Right lower leg: No edema.     Left lower leg: No edema.  Skin:    General: Skin is warm and dry.  Neurological:     Mental Status: She is alert and oriented to person, place, and time.  Psychiatric:        Mood and Affect: Mood normal.        Behavior: Behavior normal.    EKG compared to 07/13/2020.   Persistent frequent ectopic ventricular beats, rate 75.  Right bundle branch block.  No apparent acute changes.   Assessment & Plan:  Gina Pace is a 87 y.o. female . Essential hypertension  -Stable with current regimen, continue same.  PND (post-nasal drip) - Plan: ipratropium (ATROVENT) 0.06 % nasal spray Nasal congestion -  Plan: ipratropium (ATROVENT) 0.06 % nasal spray  -Did not tolerate steroid nasal spray.  Trial of ipratropium nasal spray with potential side effects discussed.  If no relief for unable to tolerate consider ENT referral.  Hypercalcemia - Plan: Ambulatory referral to Endocrinology Primary hyperparathyroidism [facility]) - Plan: Ambulatory referral to Endocrinology  -Suspected primary hyperparathyroidism based on labs, refer to endocrinology.  Irregular heart rate - Plan: EKG 12-Lead  -Frequent ectopic beats, asymptomatic.  Similar EKG to prior.    Impacted cerumen of left ear  -Plans to have removed at her audiologist  Meds ordered this encounter  Medications   ipratropium (ATROVENT) 0.06 % nasal spray    Sig: Place 1 spray into both nostrils 4 (four) times daily. As needed for nasal congestion    Dispense:  15 mL    Refill:  5   Patient Instructions  Blood pressure looks ok today. If readings are persistently elevated or dropping low - be seen right away. No med changes today.   Can try new nasal spray for congestion/drainage. That may help with sinus pressure and ear symptoms as well. If new nasal spray is not tolerated, I recommend meeting with ENT specialist.  Let me know and I can place a referral if needed.   Air purifier in home may help with dust particles as well.   Left ear does have some earwax. We can clean that here if needed. Keep follow up with audiologist.  I do recommend meeting with audiologist to check the fit of hearing aids.  Call cardiologist for follow up.   Return to the clinic or go to the nearest emergency room if any of your  symptoms worsen or new symptoms occur.    Managing Your Hypertension Hypertension, also called high blood pressure, is when the force of the blood pressing against the walls of the arteries is too strong. Arteries are blood vessels that carry blood from your heart throughout your body. Hypertension forces the heart to work harder to pump blood and may cause the arteries tobecome narrow or stiff. Understanding blood pressure readings Your personal target blood pressure may vary depending on your medical conditions, your age, and other factors. A blood pressure reading includes a higher number over a lower number. Ideally, your blood pressure should be below 120/80. You should know that: The first, or top, number is called the systolic pressure. It is a measure of the pressure in your arteries as your heart beats. The second, or bottom number, is called the diastolic pressure. It is a measure of the pressure in your arteries as the heart relaxes. Blood pressure is classified into four stages. Based on your blood pressure reading, your health care provider may use the following stages to determine what type of treatment you need, if any. Systolic pressure and diastolicpressure are measured in a unit called mmHg. Normal Systolic pressure: below 809. Diastolic pressure: below 80. Elevated Systolic pressure: 983-382. Diastolic pressure: below 80. Hypertension stage 1 Systolic pressure: 505-397. Diastolic pressure: 67-34. Hypertension stage 2 Systolic pressure: 193 or above. Diastolic pressure: 90 or above. How can this condition affect me? Managing your hypertension is an important responsibility. Over time, hypertension can damage the arteries and decrease blood flow to important parts of the body, including the brain, heart, and kidneys. Having untreated or uncontrolled hypertension can lead to: A heart attack. A stroke. A weakened blood vessel (aneurysm). Heart failure. Kidney damage. Eye  damage. Metabolic syndrome. Memory and concentration problems. Vascular dementia. What actions  can I take to manage this condition? Hypertension can be managed by making lifestyle changes and possibly by taking medicines. Your health care provider will help you make a plan to bring yourblood pressure within a normal range. Nutrition  Eat a diet that is high in fiber and potassium, and low in salt (sodium), added sugar, and fat. An example eating plan is called the Dietary Approaches to Stop Hypertension (DASH) diet. To eat this way: Eat plenty of fresh fruits and vegetables. Try to fill one-half of your plate at each meal with fruits and vegetables. Eat whole grains, such as whole-wheat pasta, brown rice, or whole-grain bread. Fill about one-fourth of your plate with whole grains. Eat low-fat dairy products. Avoid fatty cuts of meat, processed or cured meats, and poultry with skin. Fill about one-fourth of your plate with lean proteins such as fish, chicken without skin, beans, eggs, and tofu. Avoid pre-made and processed foods. These tend to be higher in sodium, added sugar, and fat. Reduce your daily sodium intake. Most people with hypertension should eat less than 1,500 mg of sodium a day.  Lifestyle  Work with your health care provider to maintain a healthy body weight or to lose weight. Ask what an ideal weight is for you. Get at least 30 minutes of exercise that causes your heart to beat faster (aerobic exercise) most days of the week. Activities may include walking, swimming, or biking. Include exercise to strengthen your muscles (resistance exercise), such as weight lifting, as part of your weekly exercise routine. Try to do these types of exercises for 30 minutes at least 3 days a week. Do not use any products that contain nicotine or tobacco, such as cigarettes, e-cigarettes, and chewing tobacco. If you need help quitting, ask your health care provider. Control any long-term (chronic)  conditions you have, such as high cholesterol or diabetes. Identify your sources of stress and find ways to manage stress. This may include meditation, deep breathing, or making time for fun activities.  Alcohol use Do not drink alcohol if: Your health care provider tells you not to drink. You are pregnant, may be pregnant, or are planning to become pregnant. If you drink alcohol: Limit how much you use to: 0-1 drink a day for women. 0-2 drinks a day for men. Be aware of how much alcohol is in your drink. In the [place] one drink equals one 12 oz bottle of beer (355 mL), one 5 oz glass of wine (148 mL), or one 1 oz glass of hard liquor (44 mL). Medicines Your health care provider may prescribe medicine if lifestyle changes are not enough to get your blood pressure under control and if: Your systolic blood pressure is 130 or higher. Your diastolic blood pressure is 80 or higher. Take medicines only as told by your health care provider. Follow the directions carefully. Blood pressure medicines must be taken as told by your health care provider. The medicine does not work as well when you skip doses. Skippingdoses also puts you at risk for problems. Monitoring Before you monitor your blood pressure: Do not smoke, drink caffeinated beverages, or exercise within 30 minutes before taking a measurement. Use the bathroom and empty your bladder (urinate). Sit quietly for at least 5 minutes before taking measurements. Monitor your blood pressure at home as told by your health care provider. To do this: Sit with your back straight and supported. Place your feet flat on the floor. Do not cross your legs. Support your arm  on a flat surface, such as a table. Make sure your upper arm is at heart level. Each time you measure, take two or three readings one minute apart and record the results. You may also need to have your blood pressure checked regularly by your healthcare provider. General  information Talk with your health care provider about your diet, exercise habits, and other lifestyle factors that may be contributing to hypertension. Review all the medicines you take with your health care provider because there may be side effects or interactions. Keep all visits as told by your health care provider. Your health care provider can help you create and adjust your plan for managing your high blood pressure. Where to find more information National Heart, Lung, and Blood Institute: https://wilson-eaton.com/ American Heart Association: www.heart.org Contact a health care provider if: You think you are having a reaction to medicines you have taken. You have repeated (recurrent) headaches. You feel dizzy. You have swelling in your ankles. You have trouble with your vision. Get help right away if: You develop a severe headache or confusion. You have unusual weakness or numbness, or you feel faint. You have severe pain in your chest or abdomen. You vomit repeatedly. You have trouble breathing. These symptoms may represent a serious problem that is an emergency. Do not wait to see if the symptoms will go away. Get medical help right away. Call your local emergency services (911 in the [place] Do not drive yourself to the hospital. Summary Hypertension is when the force of blood pumping through your arteries is too strong. If this condition is not controlled, it may put you at risk for serious complications. Your personal target blood pressure may vary depending on your medical conditions, your age, and other factors. For most people, a normal blood pressure is less than 120/80. Hypertension is managed by lifestyle changes, medicines, or both. Lifestyle changes to help manage hypertension include losing weight, eating a healthy, low-sodium diet, exercising more, stopping smoking, and limiting alcohol. This information is not intended to replace advice given to you by your health care  provider. Make sure you discuss any questions you have with your healthcare provider. Document Revised: 09/27/2019 Document Reviewed: 07/23/2019 Elsevier Patient Education  2022 [place],   Merri Ray, MD [facility] 02/25/21 12:41 PM

## 2021-02-26 NOTE — Telephone Encounter (Signed)
Pt informed voiced understanding.

## 2021-03-05 ENCOUNTER — Telehealth: Payer: Self-pay

## 2021-03-05 NOTE — Telephone Encounter (Signed)
Pt was referred to [facility]

## 2021-03-05 NOTE — Telephone Encounter (Signed)
Pt Was referred to [facility] and she received a call they can not work her in until [place] of September and she said she can not wait that long. Pt is wanting to know what she can do the nasal spray is making dizzy. Wanting to know is there any where else she can get in sooner?   Pt call back [phone]

## 2021-03-09 ENCOUNTER — Telehealth: Payer: Self-pay | Admitting: Family Medicine

## 2021-03-09 ENCOUNTER — Telehealth: Payer: Self-pay | Admitting: Endocrinology

## 2021-03-09 ENCOUNTER — Other Ambulatory Visit: Payer: Self-pay

## 2021-03-09 DIAGNOSIS — J019 Acute sinusitis, unspecified: Secondary | ICD-10-CM

## 2021-03-09 DIAGNOSIS — R0981 Nasal congestion: Secondary | ICD-10-CM

## 2021-03-09 DIAGNOSIS — R0982 Postnasal drip: Secondary | ICD-10-CM

## 2021-03-09 NOTE — Telephone Encounter (Signed)
Dr.Greene called regarding NP appt, he wants to know if its possible to move pt appt up, he doesn't want the pt to wait till September.

## 2021-03-09 NOTE — Telephone Encounter (Signed)
Patient's  daughter would like to have a Cat Scan done of her mom's sinuses - Patient  is still experiencing sinus problems. Please advise -

## 2021-03-09 NOTE — Telephone Encounter (Signed)
[facility] has an appt. On 03/18/2021 at 1:00 pm. With Pietro Cassis

## 2021-03-09 NOTE — Telephone Encounter (Signed)
Spoke with Gina Pace this afternoon she said yes please referrer her to ENT but she wants to be seen as soon as possible. She also wants to know if a scan or a head scan of her sinus that is if she can't get into ENT or Endocrinologist. She said she is okay with the dizziness is letting up she is using Ayr saline mist and she said that seem to help a little.   Pt call back [phone]

## 2021-03-09 NOTE — Telephone Encounter (Signed)
Any endocrinologist is okay if sooner appointment available. If she is unable to tolerate the nasal spray can stop it and I can refer her to ENT per our plan from last visit.  Let me know if she would like me to place that referral as well.  If any new dizziness or new symptoms, needs to be seen.

## 2021-03-10 NOTE — Telephone Encounter (Signed)
Pt made aware of appt information

## 2021-03-12 ENCOUNTER — Other Ambulatory Visit: Payer: Self-pay | Admitting: Family Medicine

## 2021-03-12 DIAGNOSIS — I10 Essential (primary) hypertension: Secondary | ICD-10-CM

## 2021-03-12 MED ORDER — CEFDINIR 300 MG PO CAPS: 300.0000 mg | ORAL_CAPSULE | Freq: Two times a day (BID) | ORAL | 0 refills | Status: DC

## 2021-03-12 NOTE — Telephone Encounter (Signed)
Called pt - discussed question of sinus issues.  Still with sinus congestion. Head congestion. Lightheaded with turning head quickly. Topical vicks helps relieve soreness. PND.  No fever.  No discolored nasal discharge.  No dyspnea.  Some cheek pain and above eyebrows. Pain in cheeks and above eyebrows.  Has appt with ENT in 2 weeks.  Ayr saline spray helps.  Felt dizzy with other nasal spray from last visit.  Sensitive to medication, but did tolerate kefex for other issues in past, no cross reaction with prior pcn allergy.  Discussed CT, but will initially try treating for infection first, rx omnicef, consider CT if not improving next week. Possible side effects discussed and ER precautions given - understanding expressed.

## 2021-03-15 ENCOUNTER — Telehealth: Payer: Self-pay

## 2021-03-15 DIAGNOSIS — J019 Acute sinusitis, unspecified: Secondary | ICD-10-CM

## 2021-03-15 NOTE — Telephone Encounter (Signed)
Spoke with patient's daughter, Jackelyn Poling and she is aware of provider recommendations.

## 2021-03-15 NOTE — Telephone Encounter (Signed)
Noted.  I am sorry she did not tolerate that medication.  Continue saline nasal spray, okay to hold Omnicef for now, I will see if we can get a CT scan ordered for today or tomorrow.

## 2021-03-15 NOTE — Telephone Encounter (Signed)
Pt was prescribed cefdinir (OMNICEF) 300 MG capsule she started taking Saturdayand pt noticed she was dizzy and light headed and the daughter did not know if it was coming from the meds or her being sick.   Also the daughter was calling and cheking on the CT scan that she said was ordered. I do not see that the ordered has been placed at this time she wants her number put down for contact instead of her mom.   Daughter name is Neoma Laming and her contact number is [phone]

## 2021-03-16 ENCOUNTER — Other Ambulatory Visit: Payer: Self-pay | Admitting: Family Medicine

## 2021-03-16 ENCOUNTER — Other Ambulatory Visit: Payer: Self-pay

## 2021-03-16 ENCOUNTER — Ambulatory Visit
Admission: RE | Admit: 2021-03-16 | Discharge: 2021-03-16 | Disposition: A | Discharge status: Home / Self Care | Payer: Medicare Other | Source: Ambulatory Visit | Attending: Family Medicine | Admitting: Family Medicine

## 2021-03-16 DIAGNOSIS — J019 Acute sinusitis, unspecified: Secondary | ICD-10-CM

## 2021-03-18 ENCOUNTER — Ambulatory Visit: Payer: Medicare Other | Admitting: Endocrinology

## 2021-03-22 NOTE — Telephone Encounter (Signed)
Patients daughter called back and said that they have still not heard from anyone on the cat scan results - please call daughter at number listed in encounter.

## 2021-03-23 NOTE — Telephone Encounter (Signed)
Called patient's daughter Jackelyn Poling with results. She voiced understanding.

## 2021-03-23 NOTE — Telephone Encounter (Signed)
Left a voice mail message for the patient's daughter to call back for results.

## 2021-03-23 NOTE — Telephone Encounter (Signed)
Results sent by mychart per Dr Carlota Raspberry

## 2021-03-23 NOTE — Telephone Encounter (Signed)
Please call the Gina Pace she would like someone to call her in regards the results. Please call [phone]

## 2021-03-23 NOTE — Telephone Encounter (Signed)
Daughter has returned call.  Would like a follow up call.

## 2021-04-19 ENCOUNTER — Ambulatory Visit (INDEPENDENT_AMBULATORY_CARE_PROVIDER_SITE_OTHER): Payer: Medicare Other | Admitting: Endocrinology

## 2021-04-19 ENCOUNTER — Other Ambulatory Visit: Payer: Self-pay

## 2021-04-19 ENCOUNTER — Encounter: Payer: Self-pay | Admitting: Endocrinology

## 2021-04-19 LAB — BASIC METABOLIC PANEL
BUN: 21 mg/dL (ref 6–23)
CO2: 27 mEq/L (ref 19–32)
Calcium: 10.6 mg/dL — ABNORMAL HIGH (ref 8.4–10.5)
Chloride: 103 mEq/L (ref 96–112)
Creatinine, Ser: 0.96 mg/dL (ref 0.40–1.20)
GFR: 50.75 mL/min — ABNORMAL LOW (ref >60.00)
Glucose, Bld: 104 mg/dL — ABNORMAL HIGH (ref 70–99)
Potassium: 4.7 mEq/L (ref 3.5–5.1)
Sodium: 138 mEq/L (ref 135–145)

## 2021-04-19 NOTE — Progress Notes (Signed)
Subjective:    Patient ID: Gina Pace, female    DOB: 09-24-1924, 87 y.o.   MRN: [id number]  HPI Pt is referred by Dr Carlota Raspberry, for hypercalcemia.  Pt was noted to have hypercalcemia in 2020.  she has never had osteoporosis, urolithiasis, thyroid probs, sarcoidosis, cancer, pancreatitis, depression; only bony fracture was left humerus in 2021.  she does not take vitamin-D or A supplements.  Pt denies taking antacids, Li++, or HCTZ.   Past Medical History:  Diagnosis Date   Allergy    Cataract    GI bleed    Hypertension 02/2019   Newly diagnosed    Osteoporosis    Tuberculosis    Ulcer of gastric fundus, acute     Past Surgical History:  Procedure Laterality Date   ABDOMINAL HYSTERECTOMY     APPENDECTOMY     Bladder     Bladder tack   EYE SURGERY     Rectum     Rectal tear repair    Social History   Socioeconomic History   Marital status: Widowed    Spouse name: Not on file   Number of children: 3   Years of education: Not on file   Highest education level: Not on file  Occupational History   Occupation: Book Therapist, nutritional    Comment: Retired    Occupation: Higher education careers adviser     Comment: Retired   Tobacco Use   Smoking status: Never   Smokeless tobacco: Never  Scientific laboratory technician Use: Never used  Substance and Sexual Activity   Alcohol use: Yes    Comment: 1 Glass of Scotch a Night   Drug use: Never   Sexual activity: Not Currently  Other Topics Concern   Not on file  Social History Narrative   Has 3 children 2 daughters and a son, who are very attentive.    Lives alone in a townhouse.    Able to perform all ADL's   Exercises daily   Belongs to social clubs   Does line dancing.    Social Determinants of Health   Financial Resource Strain: Not on file  Food Insecurity: Not on file  Transportation Needs: Not on file  Physical Activity: Not on file  Stress: Not on file  Social Connections: Not on file  Intimate Partner Violence: Not on file     Current Outpatient Medications on File Prior to Visit  Medication Sig Dispense Refill   amLODipine (NORVASC) 2.5 MG tablet Take 1 tablet (2.5 mg total) by mouth in the morning. 90 tablet 1   amLODipine (NORVASC) 5 MG tablet TAKE 1 TABLET (5 MG TOTAL) BY MOUTH IN THE MORNING AND AT BEDTIME. 180 tablet 0   cefdinir (OMNICEF) 300 MG capsule Take 1 capsule (300 mg total) by mouth 2 (two) times daily. 20 capsule 0   ipratropium (ATROVENT) 0.06 % nasal spray Place 1 spray into both nostrils 4 (four) times daily. As needed for nasal congestion 15 mL 5   No current facility-administered medications on file prior to visit.    Allergies  Allergen Reactions   Chocolate Anaphylaxis    Migraines    Prednisone Hypertension   Shellfish Allergy    Chocolate Flavor    Clindamycin    Clindamycin/Lincomycin    Codeine    Erythromycin    Erythromycin Base    Lisinopril     Angioedema    Other    Penicillins    Sulfa Antibiotics  Family History  Problem Relation Age of Onset   CAD Mother 21   Heart attack Mother    CAD Father 46   Heart attack Father    Congestive Heart Failure Sister    CAD Sister        Hx of CABG   Hypercalcemia Sister    Emphysema Brother     BP (!) 170/60 (BP Location: Right Arm, Patient Position: Sitting, Cuff Size: Normal)   Pulse (!) 47   Ht 5' (1.524 m)   Wt 112 lb 12.8 oz (51.2 kg)   SpO2 97%   BMI 22.03 kg/m     Review of Systems Denies weight loss, visual loss, hematuria, depression, and back pain.      Objective:   Physical Exam VS: see vs page GEN: no distress HEAD: head: no deformity eyes: no periorbital swelling, no proptosis external nose and ears are normal NECK: supple, thyroid is not enlarged CHEST WALL: kyphosis is noted LUNGS: clear to auscultation CV: reg rate and rhythm, no murmur.  MUSCULOSKELETAL: gait is slow but steady.   EXTEMITIES: no deformity.  Trace bilat leg edema NEURO:  readily moves all 4's.  sensation is  intact to touch on all 4's SKIN:  Normal texture and temperature.  No rash or suspicious lesion is visible.   NODES:  None palpable at the neck PSYCH: alert, well-oriented.  Does not appear anxious nor depressed.    Lab Results  Component Value Date   PTH 74 (H) 12/07/2020   PTH Comment 12/07/2020   CALCIUM 10.6 (H) 12/07/2020   I have reviewed outside records, and summarized: Pt was noted to have elevated Ca++, and referred here.  Otalgia and HTN were also addressed     Assessment & Plan:  Hyperparathyroidism, primary.  Mild.  uncertain etiology and prognosis.  She does not have indication for surgery now.    Patient Instructions  Please call [phone] to schedule a Bone Density (DexaScan) a the [place] [facility] office at [place]. Blood tests are requested for you today.  We'll let you know about the results.   If these are OK, the high calcium needs no treatment. Please come back for a follow-up appointment in 6 months.

## 2021-04-19 NOTE — Patient Instructions (Addendum)
Please call [phone] to schedule a Bone Density (DexaScan) a the [place] office at [place]. Blood tests are requested for you today.  We'll let you know about the results.   If these are OK, the high calcium needs no treatment. Please come back for a follow-up appointment in 6 months.

## 2021-04-20 ENCOUNTER — Other Ambulatory Visit: Payer: Self-pay | Admitting: Family Medicine

## 2021-04-20 DIAGNOSIS — I10 Essential (primary) hypertension: Secondary | ICD-10-CM

## 2021-04-20 LAB — VITAMIN D 25 HYDROXY (VIT D DEFICIENCY, FRACTURES): VITD: 27.57 ng/mL — ABNORMAL LOW (ref 30.00–100.00)

## 2021-04-23 LAB — PROTEIN ELECTROPHORESIS, SERUM
Albumin ELP: 4.3 g/dL (ref 3.8–4.8)
Alpha 1: 0.3 g/dL (ref 0.2–0.3)
Alpha 2: 0.7 g/dL (ref 0.5–0.9)
Beta 2: 0.3 g/dL (ref 0.2–0.5)
Beta Globulin: 0.4 g/dL (ref 0.4–0.6)
Gamma Globulin: 1 g/dL (ref 0.8–1.7)
Total Protein: 7 g/dL (ref 6.1–8.1)

## 2021-04-23 LAB — VITAMIN D 1,25 DIHYDROXY
Vitamin D 1, 25 (OH)2 Total: 45 pg/mL (ref 18–72)
Vitamin D2 1, 25 (OH)2: <8 pg/mL
Vitamin D3 1, 25 (OH)2: 45 pg/mL

## 2021-04-23 LAB — PTH-RELATED PEPTIDE: PTH-Related Protein (PTH-RP): 10 pg/mL — ABNORMAL LOW (ref 11–20)

## 2021-04-23 LAB — VITAMIN A: Vitamin A (Retinoic Acid): 43 ug/dL (ref 38–98)

## 2021-04-26 ENCOUNTER — Other Ambulatory Visit: Payer: Self-pay

## 2021-04-26 ENCOUNTER — Telehealth: Payer: Self-pay

## 2021-04-26 DIAGNOSIS — I10 Essential (primary) hypertension: Secondary | ICD-10-CM

## 2021-04-26 MED ORDER — AMLODIPINE BESYLATE 2.5 MG PO TABS: 2.5000 mg | ORAL_TABLET | Freq: Every morning | ORAL | 1 refills | Status: DC

## 2021-04-26 NOTE — Telephone Encounter (Signed)
Medication sent to patient's pharmacy.

## 2021-04-26 NOTE — Telephone Encounter (Signed)
Pt needs refill on amLODipine (NORVASC) 2.5 MG tablet she went to stay with her sister and only took the 2.5 mg with her so she had to use it since she did not have the 5 mg with her. Now patient is out of the 2.5 mg ealry because of this. Is there a way to get a new RX or what will the patient need to do since she is out of the 2.5 mg?  [id number] [address] [place] [place]  Pt call back [phone]

## 2021-04-26 NOTE — Telephone Encounter (Signed)
Patient said to disregard - she got it straightened out with [facility]

## 2021-04-27 ENCOUNTER — Telehealth: Payer: Self-pay | Admitting: Endocrinology

## 2021-04-27 NOTE — Telephone Encounter (Signed)
Patient requests to be called at ph# [phone] to be given her lab results (computer is currently inaccessible)

## 2021-04-27 NOTE — Telephone Encounter (Signed)
Notified pt with the lab results and instruction by Dr. Loanne Drilling. Pt voiced understanding.

## 2021-05-26 ENCOUNTER — Ambulatory Visit: Payer: Medicare Other | Admitting: Endocrinology

## 2021-05-27 ENCOUNTER — Telehealth: Payer: Self-pay | Admitting: Family Medicine

## 2021-05-27 NOTE — Telephone Encounter (Signed)
Left message for patient to call back and schedule Medicare Annual Wellness Visit (AWV).   Please offer to do virtually or by telephone.  Left office number and my jabber [phone].  Last AWV:11/20/2019  Please schedule at anytime with Nurse Health Advisor.

## 2021-07-19 ENCOUNTER — Ambulatory Visit: Payer: Medicare Other

## 2021-07-22 ENCOUNTER — Ambulatory Visit (INDEPENDENT_AMBULATORY_CARE_PROVIDER_SITE_OTHER): Payer: Medicare Other

## 2021-07-22 DIAGNOSIS — Z0000 Encounter for general adult medical examination without abnormal findings: Secondary | ICD-10-CM

## 2021-07-22 NOTE — Patient Instructions (Signed)
Ms. Gina Pace , Thank you for taking time to come for your Medicare Wellness Visit. I appreciate your ongoing commitment to your health goals. Please review the following plan we discussed and let me know if I can assist you in the future.   Screening recommendations/referrals: Colonoscopy: no longer required  Mammogram: no longer required  Bone Density: ordered 04/19/2021 Recommended yearly ophthalmology/optometry visit for glaucoma screening and checkup Recommended yearly dental visit for hygiene and checkup  Vaccinations: Influenza vaccine: declined  Pneumococcal vaccine: declined  Tdap vaccine: declined  Shingles vaccine: declined     Advanced directives: will provide copies   Conditions/risks identified: none   Next appointment: none    Preventive Care 50 Years and Older, Female Preventive care refers to lifestyle choices and visits with your health care provider that can promote health and wellness. What does preventive care include? A yearly physical exam. This is also called an annual well check. Dental exams once or twice a year. Routine eye exams. Ask your health care provider how often you should have your eyes checked. Personal lifestyle choices, including: Daily care of your teeth and gums. Regular physical activity. Eating a healthy diet. Avoiding tobacco and drug use. Limiting alcohol use. Practicing safe sex. Taking low-dose aspirin every day. Taking vitamin and mineral supplements as recommended by your health care provider. What happens during an annual well check? The services and screenings done by your health care provider during your annual well check will depend on your age, overall health, lifestyle risk factors, and family history of disease. Counseling  Your health care provider may ask you questions about your: Alcohol use. Tobacco use. Drug use. Emotional well-being. Home and relationship well-being. Sexual activity. Eating habits. History  of falls. Memory and ability to understand (cognition). Work and work Statistician. Reproductive health. Screening  You may have the following tests or measurements: Height, weight, and BMI. Blood pressure. Lipid and cholesterol levels. These may be checked every 5 years, or more frequently if you are over 85 years old. Skin check. Lung cancer screening. You may have this screening every year starting at age 67 if you have a 30-pack-year history of smoking and currently smoke or have quit within the past 15 years. Fecal occult blood test (FOBT) of the stool. You may have this test every year starting at age 36. Flexible sigmoidoscopy or colonoscopy. You may have a sigmoidoscopy every 5 years or a colonoscopy every 10 years starting at age 15. Hepatitis C blood test. Hepatitis B blood test. Sexually transmitted disease (STD) testing. Diabetes screening. This is done by checking your blood sugar (glucose) after you have not eaten for a while (fasting). You may have this done every 1-3 years. Bone density scan. This is done to screen for osteoporosis. You may have this done starting at age 39. Mammogram. This may be done every 1-2 years. Talk to your health care provider about how often you should have regular mammograms. Talk with your health care provider about your test results, treatment options, and if necessary, the need for more tests. Vaccines  Your health care provider may recommend certain vaccines, such as: Influenza vaccine. This is recommended every year. Tetanus, diphtheria, and acellular pertussis (Tdap, Td) vaccine. You may need a Td booster every 10 years. Zoster vaccine. You may need this after age 39. Pneumococcal 13-valent conjugate (PCV13) vaccine. One dose is recommended after age 53. Pneumococcal polysaccharide (PPSV23) vaccine. One dose is recommended after age 59. Talk to your health care provider about  which screenings and vaccines you need and how often you need  them. This information is not intended to replace advice given to you by your health care provider. Make sure you discuss any questions you have with your health care provider. Document Released: 09/18/2015 Document Revised: 05/11/2016 Document Reviewed: 06/23/2015 Elsevier Interactive Patient Education  2017 [employer] Prevention in the Home Falls can cause injuries. They can happen to people of all ages. There are many things you can do to make your home safe and to help prevent falls. What can I do on the outside of my home? Regularly fix the edges of walkways and driveways and fix any cracks. Remove anything that might make you trip as you walk through a door, such as a raised step or threshold. Trim any bushes or trees on the path to your home. Use bright outdoor lighting. Clear any walking paths of anything that might make someone trip, such as rocks or tools. Regularly check to see if handrails are loose or broken. Make sure that both sides of any steps have handrails. Any raised decks and porches should have guardrails on the edges. Have any leaves, snow, or ice cleared regularly. Use sand or salt on walking paths during winter. Clean up any spills in your garage right away. This includes oil or grease spills. What can I do in the bathroom? Use night lights. Install grab bars by the toilet and in the tub and shower. Do not use towel bars as grab bars. Use non-skid mats or decals in the tub or shower. If you need to sit down in the shower, use a plastic, non-slip stool. Keep the floor dry. Clean up any water that spills on the floor as soon as it happens. Remove soap buildup in the tub or shower regularly. Attach bath mats securely with double-sided non-slip rug tape. Do not have throw rugs and other things on the floor that can make you trip. What can I do in the bedroom? Use night lights. Make sure that you have a light by your bed that is easy to reach. Do not use  any sheets or blankets that are too big for your bed. They should not hang down onto the floor. Have a firm chair that has side arms. You can use this for support while you get dressed. Do not have throw rugs and other things on the floor that can make you trip. What can I do in the kitchen? Clean up any spills right away. Avoid walking on wet floors. Keep items that you use a lot in easy-to-reach places. If you need to reach something above you, use a strong step stool that has a grab bar. Keep electrical cords out of the way. Do not use floor polish or wax that makes floors slippery. If you must use wax, use non-skid floor wax. Do not have throw rugs and other things on the floor that can make you trip. What can I do with my stairs? Do not leave any items on the stairs. Make sure that there are handrails on both sides of the stairs and use them. Fix handrails that are broken or loose. Make sure that handrails are as long as the stairways. Check any carpeting to make sure that it is firmly attached to the stairs. Fix any carpet that is loose or worn. Avoid having throw rugs at the top or bottom of the stairs. If you do have throw rugs, attach them to the floor with  carpet tape. Make sure that you have a light switch at the top of the stairs and the bottom of the stairs. If you do not have them, ask someone to add them for you. What else can I do to help prevent falls? Wear shoes that: Do not have high heels. Have rubber bottoms. Are comfortable and fit you well. Are closed at the toe. Do not wear sandals. If you use a stepladder: Make sure that it is fully opened. Do not climb a closed stepladder. Make sure that both sides of the stepladder are locked into place. Ask someone to hold it for you, if possible. Clearly mark and make sure that you can see: Any grab bars or handrails. First and last steps. Where the edge of each step is. Use tools that help you move around (mobility aids)  if they are needed. These include: Canes. Walkers. Scooters. Crutches. Turn on the lights when you go into a dark area. Replace any light bulbs as soon as they burn out. Set up your furniture so you have a clear path. Avoid moving your furniture around. If any of your floors are uneven, fix them. If there are any pets around you, be aware of where they are. Review your medicines with your doctor. Some medicines can make you feel dizzy. This can increase your chance of falling. Ask your doctor what other things that you can do to help prevent falls. This information is not intended to replace advice given to you by your health care provider. Make sure you discuss any questions you have with your health care provider. Document Released: 06/18/2009 Document Revised: 01/28/2016 Document Reviewed: 09/26/2014 Elsevier Interactive Patient Education  2017 [employer].

## 2021-07-22 NOTE — Progress Notes (Signed)
Subjective:   Gina Pace is a 87 y.o. female who presents for Medicare Annual (Subsequent) preventive examination.  I connected with Gina Angiolinotoday by telephone and verified that I am speaking with the correct person using two identifiers. Location patient: home Location provider: work Persons participating in the virtual visit: patient, provider.   I discussed the limitations, risks, security and privacy concerns of performing an evaluation and management service by telephone and the availability of in person appointments. I also discussed with the patient that there may be a patient responsible charge related to this service. The patient expressed understanding and verbally consented to this telephonic visit.    Interactive audio and video telecommunications were attempted between this provider and patient, however failed, due to patient having technical difficulties OR patient did not have access to video capability.  We continued and completed visit with audio only.    Review of Systems           Objective:    Today's Vitals   There is no height or weight on file to calculate BMI.  Advanced Directives 07/22/2021 11/20/2019 03/02/2019 02/27/2019  Does Patient Have a Medical Advance Directive? Yes Yes No No  Type of Paramedic of Tab;Living will [place];Living will - -  Does patient want to make changes to medical advance directive? - No - Patient declined - -  Copy of [place] in Chart? No - copy requested No - copy requested - -  Would patient like information on creating a medical advance directive? - - No - Patient declined No - Patient declined    Current Medications (verified) Outpatient Encounter Medications as of 07/22/2021  Medication Sig   amLODipine (NORVASC) 2.5 MG tablet Take 1 tablet (2.5 mg total) by mouth in the morning.   amLODipine (NORVASC) 5 MG tablet TAKE 1 TABLET (5 MG  TOTAL) BY MOUTH IN THE MORNING AND AT BEDTIME.   cefdinir (OMNICEF) 300 MG capsule Take 1 capsule (300 mg total) by mouth 2 (two) times daily. (Patient not taking: Reported on 07/22/2021)   ipratropium (ATROVENT) 0.06 % nasal spray Place 1 spray into both nostrils 4 (four) times daily. As needed for nasal congestion (Patient not taking: Reported on 07/22/2021)   No facility-administered encounter medications on file as of 07/22/2021.    Allergies (verified) Chocolate, Prednisone, Shellfish allergy, Chocolate flavor, Clindamycin, Clindamycin/lincomycin, Codeine, Erythromycin, Erythromycin base, Lisinopril, Other, Penicillins, and Sulfa antibiotics   History: Past Medical History:  Diagnosis Date   Allergy    Cataract    GI bleed    Hypertension 02/2019   Newly diagnosed    Osteoporosis    Tuberculosis    Ulcer of gastric fundus, acute    Past Surgical History:  Procedure Laterality Date   ABDOMINAL HYSTERECTOMY     APPENDECTOMY     Bladder     Bladder tack   EYE SURGERY     Rectum     Rectal tear repair   Family History  Problem Relation Age of Onset   CAD Mother 47   Heart attack Mother    CAD Father 71   Heart attack Father    Congestive Heart Failure Sister    CAD Sister        Hx of CABG   Hypercalcemia Sister    Emphysema Brother    Social History   Socioeconomic History   Marital status: Widowed    Spouse name: Not on file  Number of children: 3   Years of education: Not on file   Highest education level: Not on file  Occupational History   Occupation: Clinical cytogeneticist    Comment: Retired    Occupation: Higher education careers adviser     Comment: Retired   Tobacco Use   Smoking status: Never   Smokeless tobacco: Never  Scientific laboratory technician Use: Never used  Substance and Sexual Activity   Alcohol use: Yes    Comment: 1 Glass of Scotch a Night   Drug use: Never   Sexual activity: Not Currently  Other Topics Concern   Not on file  Social History Narrative   Has  3 children 2 daughters and a son, who are very attentive.    Lives alone in a townhouse.    Able to perform all ADL's   Exercises daily   Belongs to social clubs   Does line dancing.    Social Determinants of Health   Financial Resource Strain: Low Risk    Difficulty of Paying Living Expenses: Not hard at all  Food Insecurity: No Food Insecurity   Worried About Charity fundraiser in the Last Year: Never true   [place] in the Last Year: Never true  Transportation Needs: No Transportation Needs   Lack of Transportation (Medical): No   Lack of Transportation (Non-Medical): No  Physical Activity: Inactive   Days of Exercise per Week: 0 days   Minutes of Exercise per Session: 0 min  Stress: No Stress Concern Present   Feeling of Stress : Not at all  Social Connections: Moderately Integrated   Frequency of Communication with Friends and Family: Twice a week   Frequency of Social Gatherings with Friends and Family: Twice a week   Attends Religious Services: More than 4 times per year   Active Member of [employer] or Organizations: Yes   Attends Archivist Meetings: 1 to 4 times per year   Marital Status: Widowed    Tobacco Counseling Counseling given: Not Answered   Clinical Intake:  Pre-visit preparation completed: Yes  Pain : No/denies pain     Nutritional Risks: None Diabetes: No  How often do you need to have someone help you when you read instructions, pamphlets, or other written materials from your doctor or pharmacy?: 1 - Never What is the last grade level you completed in school?: high school  Diabetic?no  Interpreter Needed?: No  Information entered by :: L.Joseluis Alessio,LPN   Activities of Daily Living No flowsheet data found.  Patient Care Team: Wendie Agreste, MD as PCP - General (Family Medicine)  Indicate any recent Medical Services you may have received from other than [facility] providers in the past year (date may be approximate).      Assessment:   This is a routine wellness examination for Gina Pace.  Hearing/Vision screen Vision Screening - Comments:: Annual eye exam wear glasses   Dietary issues and exercise activities discussed:     Goals Addressed   None    Depression Screen PHQ 2/9 Scores 07/22/2021 02/25/2021 11/25/2020 09/11/2020 08/13/2020 07/13/2020 04/08/2020  PHQ - 2 Score 0 0 0 0 0 0 0  PHQ- 9 Score - 0 - - - - -    Fall Risk Fall Risk  02/25/2021 11/25/2020 09/11/2020 08/13/2020 07/13/2020  Falls in the past year? 0 0 1 0 1  Number falls in past yr: - - 0 - 0  Injury with Fall? - - 1 - 1  Comment - - - - -  Follow up Falls evaluation completed Falls evaluation completed Falls evaluation completed Falls evaluation completed Falls evaluation completed    [place]:  Any stairs in or around the home? Yes  If so, are there any without handrails? No  Home free of loose throw rugs in walkways, pet beds, electrical cords, etc? Yes  Adequate lighting in your home to reduce risk of falls? Yes   ASSISTIVE DEVICES UTILIZED TO PREVENT FALLS:  Life alert? Yes  Use of a cane, walker or w/c? No  Grab bars in the bathroom? Yes  Shower chair or bench in shower? Yes  Elevated toilet seat or a handicapped toilet? Yes    Cognitive Function:  Normal cognitive status assessed by direct observation by this Nurse Health Advisor. No abnormalities found.     6CIT Screen 11/20/2019  What Year? 0 points  What month? 0 points  What time? 0 points  Count back from 20 2 points  Months in reverse 2 points  Repeat phrase 0 points  Total Score 4    Immunizations Immunization History  Administered Date(s) Administered   PFIZER Comirnaty(Gray Top)Covid-19 Tri-Sucrose Vaccine 04/03/2020, 04/30/2020    TDAP status: Due, Education has been provided regarding the importance of this vaccine. Advised may receive this vaccine at local pharmacy or Health Dept. Aware to provide a copy of the  vaccination record if obtained from local pharmacy or Health Dept. Verbalized acceptance and understanding.  Flu Vaccine status: Declined, Education has been provided regarding the importance of this vaccine but patient still declined. Advised may receive this vaccine at local pharmacy or Health Dept. Aware to provide a copy of the vaccination record if obtained from local pharmacy or Health Dept. Verbalized acceptance and understanding.  Pneumococcal vaccine status: Declined,  Education has been provided regarding the importance of this vaccine but patient still declined. Advised may receive this vaccine at local pharmacy or Health Dept. Aware to provide a copy of the vaccination record if obtained from local pharmacy or Health Dept. Verbalized acceptance and understanding.   Covid-19 vaccine status: Declined, Education has been provided regarding the importance of this vaccine but patient still declined. Advised may receive this vaccine at local pharmacy or Health Dept.or vaccine clinic. Aware to provide a copy of the vaccination record if obtained from local pharmacy or Health Dept. Verbalized acceptance and understanding.  Qualifies for Shingles Vaccine? Yes   Zostavax completed No   Shingrix Completed?: No.    Education has been provided regarding the importance of this vaccine. Patient has been advised to call insurance company to determine out of pocket expense if they have not yet received this vaccine. Advised may also receive vaccine at local pharmacy or Health Dept. Verbalized acceptance and understanding.  Screening Tests Health Maintenance  Topic Date Due   Zoster Vaccines- Shingrix (1 of 2) Never done   Pneumonia Vaccine 80+ Years old (1 - PCV) Never done   DEXA SCAN  Never done   COVID-19 Vaccine (3 - Booster for Pfizer series) 06/25/2020   INFLUENZA VACCINE  Never done   TETANUS/TDAP  02/25/2022 (Originally 02/01/1944)   HPV VACCINES  Aged Out    Health Maintenance  Health  Maintenance Due  Topic Date Due   Zoster Vaccines- Shingrix (1 of 2) Never done   Pneumonia Vaccine 60+ Years old (1 - PCV) Never done   DEXA SCAN  Never done   COVID-19 Vaccine (3 - Booster  for [place] series) 06/25/2020   INFLUENZA VACCINE  Never done    Colorectal cancer screening: No longer required.   Mammogram status: No longer required due to age.  Bone Density status: Ordered 04/19/2021. Pt provided with contact info and advised to call to schedule appt.  Lung Cancer Screening: (Low Dose CT Chest recommended if Age 43-80 years, 30 pack-year currently smoking OR have quit w/in 15years.) does not qualify.   Lung Cancer Screening Referral: n/a  Additional Screening:  Hepatitis C Screening: does not qualify;   Vision Screening: Recommended annual ophthalmology exams for early detection of glaucoma and other disorders of the eye. Is the patient up to date with their annual eye exam?  Yes  Who is the provider or what is the name of the office in which the patient attends annual eye exams? Dr.dunn  If pt is not established with a provider, would they like to be referred to a provider to establish care? No .   Dental Screening: Recommended annual dental exams for proper oral hygiene  Community Resource Referral / Chronic Care Management: CRR required this visit?  No   CCM required this visit?  No      Plan:     I have personally reviewed and noted the following in the patient's chart:   Medical and social history Use of alcohol, tobacco or illicit drugs  Current medications and supplements including opioid prescriptions.  Functional ability and status Nutritional status Physical activity Advanced directives List of other physicians Hospitalizations, surgeries, and ER visits in previous 12 months Vitals Screenings to include cognitive, depression, and falls Referrals and appointments  In addition, I have reviewed and discussed with patient certain preventive  protocols, quality metrics, and best practice recommendations. A written personalized care plan for preventive services as well as general preventive health recommendations were provided to patient.     Randel Pigg, LPN   [id number]   Nurse Notes: none

## 2021-10-24 ENCOUNTER — Encounter (HOSPITAL_COMMUNITY): Payer: Self-pay

## 2021-10-24 ENCOUNTER — Emergency Department (HOSPITAL_COMMUNITY): Payer: Medicare Other

## 2021-10-24 ENCOUNTER — Emergency Department (HOSPITAL_COMMUNITY)
Admission: EM | Admit: 2021-10-24 | Discharge: 2021-10-25 | Disposition: A | Discharge status: Home / Self Care | Payer: Medicare Other | Attending: Emergency Medicine | Admitting: Emergency Medicine

## 2021-10-24 DIAGNOSIS — Z79899 Other long term (current) drug therapy: Secondary | ICD-10-CM | POA: Diagnosis not present

## 2021-10-24 DIAGNOSIS — R531 Weakness: Secondary | ICD-10-CM

## 2021-10-24 DIAGNOSIS — Z20822 Contact with and (suspected) exposure to covid-19: Secondary | ICD-10-CM | POA: Insufficient documentation

## 2021-10-24 DIAGNOSIS — R4182 Altered mental status, unspecified: Secondary | ICD-10-CM | POA: Diagnosis not present

## 2021-10-24 DIAGNOSIS — N390 Urinary tract infection, site not specified: Secondary | ICD-10-CM

## 2021-10-24 DIAGNOSIS — M6281 Muscle weakness (generalized): Secondary | ICD-10-CM | POA: Insufficient documentation

## 2021-10-24 LAB — CBC WITH DIFFERENTIAL/PLATELET
Abs Immature Granulocytes: 0.01 10*3/uL (ref 0.00–0.07)
Basophils Absolute: 0.1 10*3/uL (ref 0.0–0.1)
Basophils Relative: 1 %
Eosinophils Absolute: 0.1 10*3/uL (ref 0.0–0.5)
Eosinophils Relative: 1 %
HCT: 40 % (ref 36.0–46.0)
Hemoglobin: 12.8 g/dL (ref 12.0–15.0)
Immature Granulocytes: 0 %
Lymphocytes Relative: 31 %
Lymphs Abs: 1.4 10*3/uL (ref 0.7–4.0)
MCH: 29.6 pg (ref 26.0–34.0)
MCHC: 32 g/dL (ref 30.0–36.0)
MCV: 92.4 fL (ref 80.0–100.0)
Monocytes Absolute: 0.3 10*3/uL (ref 0.1–1.0)
Monocytes Relative: 6 %
Neutro Abs: 2.8 10*3/uL (ref 1.7–7.7)
Neutrophils Relative %: 61 %
Platelets: 191 10*3/uL (ref 150–400)
RBC: 4.33 MIL/uL (ref 3.87–5.11)
RDW: 13.1 % (ref 11.5–15.5)
WBC: 4.6 10*3/uL (ref 4.0–10.5)
nRBC: 0 % (ref 0.0–0.2)

## 2021-10-24 LAB — URINALYSIS, ROUTINE W REFLEX MICROSCOPIC
Bilirubin Urine: NEGATIVE
Glucose, UA: NEGATIVE mg/dL
Ketones, ur: NEGATIVE mg/dL
Nitrite: NEGATIVE
Protein, ur: NEGATIVE mg/dL
Specific Gravity, Urine: 1.004 — ABNORMAL LOW (ref 1.005–1.030)
pH: 8 (ref 5.0–8.0)

## 2021-10-24 LAB — COMPREHENSIVE METABOLIC PANEL
ALT: 9 U/L (ref 0–44)
AST: 21 U/L (ref 15–41)
Albumin: 4.3 g/dL (ref 3.5–5.0)
Alkaline Phosphatase: 73 U/L (ref 38–126)
Anion gap: 4 — ABNORMAL LOW (ref 5–15)
BUN: 20 mg/dL (ref 8–23)
CO2: 26 mmol/L (ref 22–32)
Calcium: 10.1 mg/dL (ref 8.9–10.3)
Chloride: 107 mmol/L (ref 98–111)
Creatinine, Ser: 0.75 mg/dL (ref 0.44–1.00)
GFR, Estimated: >60 mL/min (ref >60)
Glucose, Bld: 103 mg/dL — ABNORMAL HIGH (ref 70–99)
Potassium: 4 mmol/L (ref 3.5–5.1)
Sodium: 137 mmol/L (ref 135–145)
Total Bilirubin: 0.5 mg/dL (ref 0.3–1.2)
Total Protein: 7.2 g/dL (ref 6.5–8.1)

## 2021-10-24 LAB — TROPONIN I (HIGH SENSITIVITY)
Troponin I (High Sensitivity): 16 ng/L (ref <18)
Troponin I (High Sensitivity): 16 ng/L (ref <18)

## 2021-10-24 LAB — RESP PANEL BY RT-PCR (FLU A&B, COVID) ARPGX2
Influenza A by PCR: NEGATIVE
Influenza B by PCR: NEGATIVE
SARS Coronavirus 2 by RT PCR: NEGATIVE

## 2021-10-24 MED ORDER — CEPHALEXIN 500 MG PO CAPS: 500.0000 mg | ORAL_CAPSULE | Freq: Four times a day (QID) | ORAL | 0 refills | Status: DC

## 2021-10-24 NOTE — ED Provider Notes (Signed)
[facility] DEPT Provider Note   CSN: [id number] Arrival date & time: 10/24/21  5009     History  No chief complaint on file.   Gina Pace is a 88 y.o. female.  88 year old female with prior medical history as detailed below presents for evaluation.  Patient reports that she felt weak this morning.  She describes global weakness.  Patient denies fever.  She denies chest pain or shortness of breath.  Patient denies visual change, speech change, or focal weakness.  Patient does report intermittent tingling in both her right hand and left hand.  This appears to be positional.  Patient reports that her symptoms were most significant this morning.  Upon arrival to the ED she reports near complete resolution of her symptoms.  Patient denies abdominal pain or back pain.  Patient lives by herself.  She is independent.  She is able to ambulate at baseline without use of cane or other assist device.  The history is provided by the patient and medical records.  Illness Location:  Generalized weakness Severity:  Mild Onset quality:  Gradual Duration:  1 day Timing:  Rare Progression:  Resolved Chronicity:  New     Home Medications Prior to Admission medications   Medication Sig Start Date End Date Taking? Authorizing Provider  amLODipine (NORVASC) 2.5 MG tablet Take 1 tablet (2.5 mg total) by mouth in the morning. 04/26/21   Wendie Agreste, MD  amLODipine (NORVASC) 5 MG tablet TAKE 1 TABLET (5 MG TOTAL) BY MOUTH IN THE MORNING AND AT BEDTIME. 03/12/21   Wendie Agreste, MD  cefdinir (OMNICEF) 300 MG capsule Take 1 capsule (300 mg total) by mouth 2 (two) times daily. Patient not taking: Reported on 07/22/2021 03/12/21   Wendie Agreste, MD  ipratropium (ATROVENT) 0.06 % nasal spray Place 1 spray into both nostrils 4 (four) times daily. As needed for nasal congestion Patient not taking: Reported on 07/22/2021 02/25/21   Wendie Agreste, MD       Allergies    Chocolate, Prednisone, Shellfish allergy, Chocolate flavor, Clindamycin, Clindamycin/lincomycin, Codeine, Erythromycin, Erythromycin base, Lisinopril, Other, Penicillins, and Sulfa antibiotics    Review of Systems   Review of Systems  All other systems reviewed and are negative.  Physical Exam Updated Vital Signs BP (!) 162/94 (BP Location: Left Arm)    Pulse 72    Temp 97.7 F (36.5 C) (Oral)    Resp 18    SpO2 95%  Physical Exam Vitals and nursing note reviewed.  Constitutional:      General: She is not in acute distress.    Appearance: Normal appearance. She is well-developed.  HENT:     Head: Normocephalic and atraumatic.  Eyes:     Conjunctiva/sclera: Conjunctivae normal.     Pupils: Pupils are equal, round, and reactive to light.  Cardiovascular:     Rate and Rhythm: Normal rate and regular rhythm.     Heart sounds: Normal heart sounds.  Pulmonary:     Effort: Pulmonary effort is normal. No respiratory distress.     Breath sounds: Normal breath sounds.  Abdominal:     General: There is no distension.     Palpations: Abdomen is soft.     Tenderness: There is no abdominal tenderness.  Musculoskeletal:        General: No deformity. Normal range of motion.     Cervical back: Normal range of motion and neck supple.  Skin:    General: Skin  is warm and dry.  Neurological:     General: No focal deficit present.     Mental Status: She is alert and oriented to person, place, and time. Mental status is at baseline.     Cranial Nerves: No cranial nerve deficit.     Sensory: No sensory deficit.     Motor: No weakness.     Coordination: Coordination normal.    ED Results / Procedures / Treatments   Labs (all labs ordered are listed, but only abnormal results are displayed) Labs Reviewed  COMPREHENSIVE METABOLIC PANEL - Abnormal; Notable for the following components:      Result Value   Glucose, Bld 103 (*)    Anion gap 4 (*)    All other components within  normal limits  URINALYSIS, ROUTINE W REFLEX MICROSCOPIC - Abnormal; Notable for the following components:   Color, Urine STRAW (*)    Specific Gravity, Urine 1.004 (*)    Hgb urine dipstick SMALL (*)    Leukocytes,Ua LARGE (*)    Bacteria, UA RARE (*)    All other components within normal limits  RESP PANEL BY RT-PCR (FLU A&B, COVID) [id number]  URINE CULTURE  CBC WITH DIFFERENTIAL/PLATELET  TROPONIN I (HIGH SENSITIVITY)  TROPONIN I (HIGH SENSITIVITY)    EKG EKG Interpretation  Date/Time:  Sunday October 24 2021 11:14:17 EST Ventricular Rate:  70 PR Interval:  198 QRS Duration: 158 QT Interval:  456 QTC Calculation: 492 R Axis:   270 Text Interpretation: Sinus rhythm with frequent Premature ventricular complexes Indeterminate axis Right bundle branch block Abnormal ECG When compared with ECG of 21-Feb-2019 14:24, PREVIOUS ECG IS PRESENT Confirmed by Dene Gentry [phone]) on 10/24/2021 11:17:57 AM  Radiology DG Chest 2 View  Result Date: 10/24/2021 CLINICAL DATA:  Weakness EXAM: CHEST - 2 VIEW COMPARISON:  Chest x-ray 02/27/2019 FINDINGS: Heart is enlarged. Mediastinum appears stable. Tortuous thoracic aorta with calcified plaques. Mildly prominent chronic interstitial lung markings with no focal consolidation identified. No pleural effusion or pneumothorax. Old fracture deformity of the proximal left humerus. IMPRESSION: Cardiomegaly with no acute process identified. Electronically Signed   By: Ofilia Neas M.D.   On: 10/24/2021 10:58   CT Head Wo Contrast  Result Date: 10/24/2021 CLINICAL DATA:  Mental status change. Hypertension. Facial numbness. EXAM: CT HEAD WITHOUT CONTRAST TECHNIQUE: Contiguous axial images were obtained from the base of the skull through the vertex without intravenous contrast. RADIATION DOSE REDUCTION: This exam was performed according to the departmental dose-optimization program which includes automated exposure control, adjustment of the mA and/or kV  according to patient size and/or use of iterative reconstruction technique. COMPARISON:  01/20/2020 FINDINGS: Brain: No evidence of acute infarction, hemorrhage, hydrocephalus, extra-axial collection or mass lesion/mass effect. There is mild diffuse low-attenuation within the subcortical and periventricular white matter compatible with chronic microvascular disease. Prominence of the sulci and ventricles compatible with brain atrophy. Vascular: No hyperdense vessel or unexpected calcification. Skull: Normal. Negative for fracture or focal lesion. Sinuses/Orbits: No acute finding. Other: None IMPRESSION: 1. No acute intracranial abnormalities. 2. Chronic small vessel ischemic disease and brain atrophy. Electronically Signed   By: Kerby Moors M.D.   On: 10/24/2021 10:47    Procedures Procedures    Medications Ordered in ED Medications - No data to display  ED Course/ Medical Decision Making/ A&P                           Medical Decision Making  Amount and/or Complexity of Data Reviewed Labs: ordered. Radiology: ordered.    Medical Screen Complete  This patient presented to the ED with complaint of generalized weakness.  This complaint involves an extensive number of treatment options. The initial differential diagnosis includes, but is not limited to, infection, metabolic abnormality, AKI, etc.  This presentation is: Acute, Self-Limited, Previously Undiagnosed, Uncertain Prognosis, Complicated, Systemic Symptoms, and Threat to Life/Bodily Function  Patient is presenting with complaint of generalized weakness.  Symptoms appear to have improved significantly since this morning.  Patient denies significant other complaint   Work-up without significant abnormality.  UA does suggest possible early UTI.    Co morbidities that complicated the patient's evaluation  Advanced age   Additional history obtained:  External records from outside sources obtained and reviewed including  prior ED visits and prior Inpatient records.    Lab Tests:  I ordered and personally interpreted labs.  The pertinent results include: CBC, CMP, UA, COVID, flu, troponin   Imaging Studies ordered:  I ordered imaging studies including chest x-ray, CT head I independently visualized and interpreted obtained imaging which showed no acute disease I agree with the radiologist interpretation.   Cardiac Monitoring:  The patient was maintained on a cardiac monitor.  I personally viewed and interpreted the cardiac monitor which showed an underlying rhythm of: NSR    Problem List / ED Course:  Weakness, possible early UTI   Reevaluation:  After the interventions noted above, I reevaluated the patient and found that they have: improved  Disposition:  After consideration of the diagnostic results and the patients response to treatment, I feel that the patent would benefit from close outpatient follow-up.           Final Clinical Impression(s) / ED Diagnoses Final diagnoses:  Weakness  Urinary tract infection without hematuria, site unspecified    Rx / DC Orders ED Discharge Orders     None         Valarie Merino, MD 10/24/21 1356

## 2021-10-24 NOTE — ED Notes (Signed)
Ambulated to bathroom without assistance.

## 2021-10-24 NOTE — ED Triage Notes (Signed)
Patient coming from home with c/o numbness on the right arm and jaw over the past week.  It worse in the morning and then get better No neuro deficit. No difficulties with speech or mobility issue.

## 2021-10-24 NOTE — Discharge Instructions (Addendum)
Return for any problem.  ?

## 2021-10-25 ENCOUNTER — Ambulatory Visit: Payer: Medicare Other | Admitting: Endocrinology

## 2021-10-25 ENCOUNTER — Encounter: Payer: Self-pay | Admitting: Registered Nurse

## 2021-10-25 ENCOUNTER — Ambulatory Visit: Payer: Medicare Other | Admitting: Registered Nurse

## 2021-10-25 ENCOUNTER — Other Ambulatory Visit: Payer: Self-pay

## 2021-10-25 VITALS — BP 155/74 | HR 84 | Temp 98.2°F | Resp 17 | Ht 60.0 in | Wt 113.6 lb

## 2021-10-25 DIAGNOSIS — B9620 Unspecified Escherichia coli [E. coli] as the cause of diseases classified elsewhere: Secondary | ICD-10-CM

## 2021-10-25 DIAGNOSIS — N390 Urinary tract infection, site not specified: Secondary | ICD-10-CM | POA: Diagnosis not present

## 2021-10-25 MED ORDER — CIPROFLOXACIN HCL 250 MG PO TABS: 250.0000 mg | ORAL_TABLET | Freq: Two times a day (BID) | ORAL | 0 refills | Status: AC

## 2021-10-25 NOTE — Progress Notes (Signed)
Established Patient Office Visit  Subjective:  Patient ID: Gina Pace, female    DOB: 07-Sep-1924  Age: 88 y.o. MRN: [id number]  CC:  Chief Complaint  Patient presents with   Follow-up    Patient states she is following up for numbness in hands and some tongue swelling. And also elevated BP.    HPI Gina Pace presents for ER follow up   Seen yesterday with sensation of tongue swelling, numbness in hands.  CT head shows no acute changes. CXR shows no acute changes.  Labs show likely UTI. Preliminary culture results show UTI with E Coli. Susceptibilities not yet returned.  Notes she was put on keflex qid. Sensation of tightness in throat when starting this this morning. Has stopped taking it.  Has tolerated other cephalosporins in the past despite penicillin allergy.  Has not taken amlodipine today due to her medication intolerance.  Breathing is currently ok, no concerns of worsening symptoms.   Does note the numbness in hands is ongoing. Positional. Better in neutral positions, tough to avoid at night.   Numbness in lower lip. Only occurs when denture is in. Thinks this is related to tongue swelling No bleeding in gums  Denies nvd, dysphagia Past Medical History:  Diagnosis Date   Allergy    Cataract    GI bleed    Hypertension 02/2019   Newly diagnosed    Osteoporosis    Tuberculosis    Ulcer of gastric fundus, acute     Past Surgical History:  Procedure Laterality Date   ABDOMINAL HYSTERECTOMY     APPENDECTOMY     Bladder     Bladder tack   EYE SURGERY     Rectum     Rectal tear repair    Family History  Problem Relation Age of Onset   CAD Mother 27   Heart attack Mother    CAD Father 32   Heart attack Father    Congestive Heart Failure Sister    CAD Sister        Hx of CABG   Hypercalcemia Sister    Emphysema Brother     Social History   Socioeconomic History   Marital status: Widowed    Spouse name: Not on file   Number of  children: 3   Years of education: Not on file   Highest education level: Not on file  Occupational History   Occupation: Book Therapist, nutritional    Comment: Retired    Occupation: Higher education careers adviser     Comment: Retired   Tobacco Use   Smoking status: Never   Smokeless tobacco: Never  Scientific laboratory technician Use: Never used  Substance and Sexual Activity   Alcohol use: Yes    Comment: 1 Glass of Scotch a Night   Drug use: Never   Sexual activity: Not Currently  Other Topics Concern   Not on file  Social History Narrative   Has 3 children 2 daughters and a son, who are very attentive.    Lives alone in a townhouse.    Able to perform all ADL's   Exercises daily   Belongs to social clubs   Does line dancing.    Social Determinants of Health   Financial Resource Strain: Low Risk    Difficulty of Paying Living Expenses: Not hard at all  Food Insecurity: No Food Insecurity   Worried About Charity fundraiser in the Last Year: Never true   [employer]  in the Last Year: Never true  Transportation Needs: No Transportation Needs   Lack of Transportation (Medical): No   Lack of Transportation (Non-Medical): No  Physical Activity: Inactive   Days of Exercise per Week: 0 days   Minutes of Exercise per Session: 0 min  Stress: No Stress Concern Present   Feeling of Stress : Not at all  Social Connections: Moderately Integrated   Frequency of Communication with Friends and Family: Twice a week   Frequency of Social Gatherings with Friends and Family: Twice a week   Attends Religious Services: More than 4 times per year   Active Member of [employer] or Organizations: Yes   Attends Archivist Meetings: 1 to 4 times per year   Marital Status: Widowed  Human resources officer Violence: Not At Risk   Fear of Current or Ex-Partner: No   Emotionally Abused: No   Physically Abused: No   Sexually Abused: No    Outpatient Medications Prior to Visit  Medication Sig Dispense Refill   amLODipine  (NORVASC) 2.5 MG tablet Take 1 tablet (2.5 mg total) by mouth in the morning. 90 tablet 1   amLODipine (NORVASC) 5 MG tablet TAKE 1 TABLET (5 MG TOTAL) BY MOUTH IN THE MORNING AND AT BEDTIME. 180 tablet 0   cephALEXin (KEFLEX) 500 MG capsule Take 1 capsule (500 mg total) by mouth 4 (four) times daily. 28 capsule 0   ipratropium (ATROVENT) 0.06 % nasal spray Place 1 spray into both nostrils 4 (four) times daily. As needed for nasal congestion (Patient not taking: Reported on 07/22/2021) 15 mL 5   cefdinir (OMNICEF) 300 MG capsule Take 1 capsule (300 mg total) by mouth 2 (two) times daily. (Patient not taking: Reported on 07/22/2021) 20 capsule 0   No facility-administered medications prior to visit.    Allergies  Allergen Reactions   Chocolate Anaphylaxis    Migraines    Prednisone Hypertension   Shellfish Allergy    Chocolate Flavor    Clindamycin    Clindamycin/Lincomycin    Codeine    Erythromycin    Erythromycin Base    Keflex [Cephalexin]    Lisinopril     Angioedema    Other    Penicillins    Sulfa Antibiotics     ROS Review of Systems  Constitutional: Negative.   HENT: Negative.    Eyes: Negative.   Respiratory: Negative.    Cardiovascular: Negative.   Gastrointestinal: Negative.   Genitourinary: Negative.   Musculoskeletal: Negative.   Skin: Negative.   Neurological: Negative.   Psychiatric/Behavioral: Negative.    All other systems reviewed and are negative.    Objective:    Physical Exam Vitals and nursing note reviewed.  Constitutional:      General: She is not in acute distress.    Appearance: Normal appearance. She is normal weight. She is not ill-appearing, toxic-appearing or diaphoretic.  HENT:     Mouth/Throat:     Mouth: Mucous membranes are moist.     Pharynx: Oropharynx is clear. No oropharyngeal exudate or posterior oropharyngeal erythema.     Comments: Unremarkable exam of the mouth Cardiovascular:     Rate and Rhythm: Normal rate and  regular rhythm.     Heart sounds: Normal heart sounds. No murmur heard.   No friction rub. No gallop.  Pulmonary:     Effort: Pulmonary effort is normal. No respiratory distress.     Breath sounds: Normal breath sounds. No stridor. No wheezing, rhonchi or rales.  Chest:     Chest wall: No tenderness.  Skin:    General: Skin is warm and dry.  Neurological:     General: No focal deficit present.     Mental Status: She is alert and oriented to person, place, and time. Mental status is at baseline.  Psychiatric:        Mood and Affect: Mood normal.        Behavior: Behavior normal.        Thought Content: Thought content normal.        Judgment: Judgment normal.    BP (!) 155/74    Pulse 84    Temp 98.2 F (36.8 C) (Temporal)    Resp 17    Ht 5' (1.524 m)    Wt 113 lb 9.6 oz (51.5 kg)    SpO2 100%    BMI 22.19 kg/m  Wt Readings from Last 3 Encounters:  10/25/21 113 lb 9.6 oz (51.5 kg)  04/19/21 112 lb 12.8 oz (51.2 kg)  02/25/21 113 lb 6.4 oz (51.4 kg)     Health Maintenance Due  Topic Date Due   Zoster Vaccines- Shingrix (1 of 2) Never done   Pneumonia Vaccine 73+ Years old (1 - PCV) Never done   DEXA SCAN  Never done   COVID-19 Vaccine (3 - Booster for Pfizer series) 06/25/2020   INFLUENZA VACCINE  Never done    There are no preventive care reminders to display for this patient.  Lab Results  Component Value Date   TSH 3.380 02/14/2019   Lab Results  Component Value Date   WBC 4.6 10/24/2021   HGB 12.8 10/24/2021   HCT 40.0 10/24/2021   MCV 92.4 10/24/2021   PLT 191 10/24/2021   Lab Results  Component Value Date   NA 137 10/24/2021   K 4.0 10/24/2021   CO2 26 10/24/2021   GLUCOSE 103 (H) 10/24/2021   BUN 20 10/24/2021   CREATININE 0.75 10/24/2021   BILITOT 0.5 10/24/2021   ALKPHOS 73 10/24/2021   AST 21 10/24/2021   ALT 9 10/24/2021   PROT 7.2 10/24/2021   ALBUMIN 4.3 10/24/2021   CALCIUM 10.1 10/24/2021   ANIONGAP 4 (L) 10/24/2021   EGFR 60  12/07/2020   GFR 50.75 (L) 04/19/2021   No results found for: CHOL No results found for: HDL No results found for: LDLCALC No results found for: TRIG No results found for: CHOLHDL No results found for: HGBA1C    Assessment & Plan:   Problem List Items Addressed This Visit   None Visit Diagnoses     E. coli UTI    -  Primary   Relevant Medications   ciprofloxacin (CIPRO) 250 MG tablet   Other Relevant Orders   Urinalysis, Routine w reflex microscopic   Urine Culture       Meds ordered this encounter  Medications   ciprofloxacin (CIPRO) 250 MG tablet    Sig: Take 1 tablet (250 mg total) by mouth 2 (two) times daily for 3 days.    Dispense:  6 tablet    Refill:  0    Order Specific Question:   Supervising Provider    Answer:   Carlota Raspberry, JEFFREY R [[id number]]    Follow-up: Return in about 1 week (around 11/01/2021) for Lab only - recheck UTI.   PLAN Cirpo bid x 3 days for UTI Defer to dentist for tongue and jaw numbness - suspect denture issue. Return for rescreen of UTI in 1 week  Numbness is positional, suspect related to arthritis. Recommend ice, heat, rest, and finding comfortable positions.  Patient encouraged to call clinic with any questions, comments, or concerns.  Maximiano Coss, NP

## 2021-10-25 NOTE — Patient Instructions (Addendum)
Ms. Gina Pace to see you!  Let's get this UTI cleared up. Take the ciprofloxacin I have sent over to your pharmacy.  We can recheck for UTI next week.   I will call you if we need to change the treatment plan  Follow up with dentist for mouth numbness  Thank you  Rich     If you have lab work done today you will be contacted with your lab results within the next 2 weeks.  If you have not heard from [place] then please contact us. The fastest way to get your results is to register for My Chart.   IF you received an x-ray today, you will receive an invoice from [facility]. Please contact [facility] at [phone] with questions or concerns regarding your invoice.   IF you received labwork today, you will receive an invoice from [facility]. Please contact [facility] at [phone] with questions or concerns regarding your invoice.   Our billing staff will not be able to assist you with questions regarding bills from these companies.  You will be contacted with the lab results as soon as they are available. The fastest way to get your results is to activate your My Chart account. Instructions are located on the last page of this paperwork. If you have not heard from [place] regarding the results in 2 weeks, please contact this office.

## 2021-10-26 LAB — URINE CULTURE: Culture: >=100000 — AB

## 2021-10-27 ENCOUNTER — Ambulatory Visit: Payer: Medicare Other | Admitting: Endocrinology

## 2021-10-27 ENCOUNTER — Telehealth: Payer: Self-pay | Admitting: Emergency Medicine

## 2021-10-27 NOTE — Telephone Encounter (Signed)
Post ED Visit - Positive Culture Follow-up  Culture report reviewed by antimicrobial stewardship pharmacist: [place] Team []  Elenor Quinones, Pharm.D. []  Heide Guile, Pharm.D., BCPS AQ-ID []  Parks Neptune, Pharm.D., BCPS []  Alycia Rossetti, Pharm.D., BCPS []  [place], Pharm.D., BCPS, AAHIVP []  Legrand Como, Pharm.D., BCPS, AAHIVP []  Salome Arnt, PharmD, BCPS []  Johnnette Gourd, PharmD, BCPS []  Hughes Better, PharmD, BCPS []  Leeroy Cha, PharmD []  Laqueta Linden, PharmD, BCPS []  Albertina Parr, PharmD  [place] Team []  Leodis Sias, PharmD []  Lindell Spar, PharmD []  Royetta Asal, PharmD []  Graylin Shiver, Rph []  Rema Fendt) Glennon Mac, PharmD []  Arlyn Dunning, PharmD []  Netta Cedars, PharmD []  Dia Sitter, PharmD []  Leone Haven, PharmD []  Gretta Arab, PharmD []  Theodis Shove, PharmD []  Peggyann Juba, PharmD []  Reuel Boom, PharmD   Positive urine culture Treated with cephalexin, organism sensitive to the same and no further patient follow-up is required at this time.  Hazle Nordmann 10/27/2021, 10:18 AM

## 2021-11-01 ENCOUNTER — Telehealth: Payer: Self-pay | Admitting: Family Medicine

## 2021-11-01 ENCOUNTER — Other Ambulatory Visit: Payer: Self-pay

## 2021-11-01 ENCOUNTER — Other Ambulatory Visit: Payer: Medicare Other

## 2021-11-01 ENCOUNTER — Ambulatory Visit (INDEPENDENT_AMBULATORY_CARE_PROVIDER_SITE_OTHER): Payer: Medicare Other | Admitting: Endocrinology

## 2021-11-01 ENCOUNTER — Telehealth: Payer: Self-pay

## 2021-11-01 NOTE — Telephone Encounter (Signed)
LVM for pt to let her know that Loanne Drilling tried to call her 2x and no answer. I spoke with elliosn and he stated that he would try n cb when he gets a chance.

## 2021-11-01 NOTE — Progress Notes (Signed)
Subjective:    Patient ID: Gina Pace, female    DOB: 12/08/1924, 88 y.o.   MRN: [id number]  HPI telehealth visit today via telephone x 43minutes.  Alternatives to telehealth are presented to this patient, and the patient agrees to the telehealth visit. Pt is advised of the cost of the visit, and agrees to this, also.   Patient is at home, and I am at the office.   Persons attending the telehealth visit: the patient and I Pt returns for f/u of primary hyperparathyroidism (dx'ed 2020; other w/u of hypercalcemia was neg she has never had osteoporosis or urolithiasis; only bony fracture was left humerus in 2021).  She does not take Vit-D.   Past Medical History:  Diagnosis Date   Allergy    Cataract    GI bleed    Hypertension 02/2019   Newly diagnosed    Osteoporosis    Tuberculosis    Ulcer of gastric fundus, acute     Past Surgical History:  Procedure Laterality Date   ABDOMINAL HYSTERECTOMY     APPENDECTOMY     Bladder     Bladder tack   EYE SURGERY     Rectum     Rectal tear repair    Social History   Socioeconomic History   Marital status: Widowed    Spouse name: Not on file   Number of children: 3   Years of education: Not on file   Highest education level: Not on file  Occupational History   Occupation: Book Therapist, nutritional    Comment: Retired    Occupation: Higher education careers adviser     Comment: Retired   Tobacco Use   Smoking status: Never   Smokeless tobacco: Never  Scientific laboratory technician Use: Never used  Substance and Sexual Activity   Alcohol use: Yes    Comment: 1 Glass of Scotch a Night   Drug use: Never   Sexual activity: Not Currently  Other Topics Concern   Not on file  Social History Narrative   Has 3 children 2 daughters and a son, who are very attentive.    Lives alone in a townhouse.    Able to perform all ADL's   Exercises daily   Belongs to social clubs   Does line dancing.    Social Determinants of Health   Financial Resource Strain:  Low Risk    Difficulty of Paying Living Expenses: Not hard at all  Food Insecurity: No Food Insecurity   Worried About Charity fundraiser in the Last Year: Never true   [place] in the Last Year: Never true  Transportation Needs: No Transportation Needs   Lack of Transportation (Medical): No   Lack of Transportation (Non-Medical): No  Physical Activity: Inactive   Days of Exercise per Week: 0 days   Minutes of Exercise per Session: 0 min  Stress: No Stress Concern Present   Feeling of Stress : Not at all  Social Connections: Moderately Integrated   Frequency of Communication with Friends and Family: Twice a week   Frequency of Social Gatherings with Friends and Family: Twice a week   Attends Religious Services: More than 4 times per year   Active Member of [employer] or Organizations: Yes   Attends Archivist Meetings: 1 to 4 times per year   Marital Status: Widowed  Intimate Partner Violence: Not At Risk   Fear of Current or Ex-Partner: No   Emotionally Abused: No  Physically Abused: No   Sexually Abused: No    Current Outpatient Medications on File Prior to Visit  Medication Sig Dispense Refill   amLODipine (NORVASC) 2.5 MG tablet Take 1 tablet (2.5 mg total) by mouth in the morning. 90 tablet 1   amLODipine (NORVASC) 5 MG tablet TAKE 1 TABLET (5 MG TOTAL) BY MOUTH IN THE MORNING AND AT BEDTIME. 180 tablet 0   ipratropium (ATROVENT) 0.06 % nasal spray Place 1 spray into both nostrils 4 (four) times daily. As needed for nasal congestion (Patient not taking: Reported on 11/01/2021) 15 mL 5   No current facility-administered medications on file prior to visit.    Allergies  Allergen Reactions   Chocolate Anaphylaxis    Migraines    Prednisone Hypertension   Shellfish Allergy    Chocolate Flavor    Clindamycin    Clindamycin/Lincomycin    Codeine    Erythromycin    Erythromycin Base    Keflex [Cephalexin]    Lisinopril     Angioedema    Other     Penicillins    Sulfa Antibiotics     Family History  Problem Relation Age of Onset   CAD Mother 53   Heart attack Mother    CAD Father 7   Heart attack Father    Congestive Heart Failure Sister    CAD Sister        Hx of CABG   Hypercalcemia Sister    Emphysema Brother     Ht 5' (1.524 m)    Wt 113 lb (51.3 kg)    BMI 22.07 kg/m      Review of Systems     Objective:   Physical Exam  Lab Results  Component Value Date   PTH 74 (H) 12/07/2020   PTH Comment 12/07/2020   CALCIUM 10.1 10/24/2021     Ca++=10.1    Assessment & Plan:  Primary hyperparathyroidism.  She is a poor surgical candidate, so if rx was needed, cinacalcet would be preferred.    Patient Instructions  Please call [phone] to schedule a Bone Density (DexaScan) a the [place] office at [place].  Please take non-prescription Vitamin-D, 1000 units per day.   Please come back for a follow-up appointment in 2 months.

## 2021-11-01 NOTE — Patient Instructions (Addendum)
Please call [phone] to schedule a Bone Density (DexaScan) a the [place] office at [place].  Please take non-prescription Vitamin-D, 1000 units per day.   Please come back for a follow-up appointment in 2 months.

## 2021-11-01 NOTE — Telephone Encounter (Signed)
Pt asking if you can become allergic to medications with time due to some new sxs she is experiencing please advise had hospital follow up 10/25/21

## 2021-11-01 NOTE — Telephone Encounter (Signed)
Pt needs a hospital follow up appointment to discuss these

## 2021-11-01 NOTE — Telephone Encounter (Signed)
Pt called in stating that she was seen in the ER last week. All her test came back normal. She states that she is still having weakness in her legs and her tongue was tinder. She wanted to know if she can become allergic to meds over time. She can be reached at the home #

## 2021-11-01 NOTE — Telephone Encounter (Signed)
If she is still experiencing symptoms, I would recommend follow up with PCP as he will know best what to do  Thanks,  Rich

## 2021-11-03 NOTE — Telephone Encounter (Signed)
Pt needs a follow up visit given new and worsening symptoms  ?

## 2021-11-04 ENCOUNTER — Encounter: Payer: Self-pay | Admitting: Family Medicine

## 2021-11-04 ENCOUNTER — Ambulatory Visit (INDEPENDENT_AMBULATORY_CARE_PROVIDER_SITE_OTHER): Payer: Medicare Other | Admitting: Family Medicine

## 2021-11-04 VITALS — BP 122/76 | HR 72 | Temp 97.8°F | Resp 16 | Ht 60.0 in | Wt 115.6 lb

## 2021-11-04 DIAGNOSIS — B9620 Unspecified Escherichia coli [E. coli] as the cause of diseases classified elsewhere: Secondary | ICD-10-CM | POA: Diagnosis not present

## 2021-11-04 DIAGNOSIS — R531 Weakness: Secondary | ICD-10-CM | POA: Diagnosis not present

## 2021-11-04 DIAGNOSIS — N390 Urinary tract infection, site not specified: Secondary | ICD-10-CM | POA: Diagnosis not present

## 2021-11-04 DIAGNOSIS — I493 Ventricular premature depolarization: Secondary | ICD-10-CM | POA: Diagnosis not present

## 2021-11-04 DIAGNOSIS — I499 Cardiac arrhythmia, unspecified: Secondary | ICD-10-CM

## 2021-11-04 DIAGNOSIS — Z9181 History of falling: Secondary | ICD-10-CM | POA: Diagnosis not present

## 2021-11-04 LAB — POCT URINALYSIS DIP (MANUAL ENTRY)
Bilirubin, UA: NEGATIVE
Glucose, UA: NEGATIVE mg/dL
Leukocytes, UA: NEGATIVE
Nitrite, UA: NEGATIVE
Protein Ur, POC: NEGATIVE mg/dL
Spec Grav, UA: 1.015 (ref 1.010–1.025)
Urobilinogen, UA: 0.2 E.U./dL
pH, UA: 6.5 (ref 5.0–8.0)

## 2021-11-04 NOTE — Patient Instructions (Signed)
Glad to see the reassuring work-up in the emergency room, but I am concerned with repeat weakness episode.  Exam today is reassuring but if you have another episode like you had earlier this week I do recommend evaluation through the emergency room.  I will check the urine test, and refer you to neurology.  Keep follow-up with cardiology as planned.  I do recommend using a walker at this time to lessen risk of falling and to help with stability. Please let me know if there are any questions regarding your visit today or on the lab work from last visit or emergency room visit.  Take care ? ?Return to the clinic or go to the nearest emergency room if any of your symptoms worsen or new symptoms occur. ? ? ?

## 2021-11-04 NOTE — Progress Notes (Unsigned)
Subjective:  Patient ID: Gina Pace, female    DOB: 1925-02-18  Age: 88 y.o. MRN: [id number]  CC:  Chief Complaint  Patient presents with   Extremity Weakness    Pt had weakness in her legs notes these are much improved, notes this is the second time she has had this and wants answers why this is happen     Tongue issue    Pt has not yet made an appointment with dentist as recommended tongue is swollen has been constant some days of swelling is better than others     HPI Gina Pace presents for   Here with son [name]  Weakness: Global weakness discussed at her February 19 ED visit.  Normal troponin,CT head without acute intracranial abnormalities.  Chronic small vessel ischemic disease.  COVID and flu testing negative.  CMP with mild hyperglycemia otherwise reassuring.  EKG with RBBB, frequent PVC's. Urinalysis with bacteria, large LE, urine cultures with greater than 100,000 colonies of E. coli.  Weakness may be related to UTI and treated with Keflex 500 mg 4 times daily for 1 week. Follow-up visit with Maximiano Coss on 10/25/2021. She noted a sensation of tightness in her throat when taking Keflex, and per notes had tolerated other cephalosporins in the past despite a penicillin allergy.  She had an unremarkable exam of the mouth at her February 20 visit.  Thought to have possible denture issue with tongue and jaw numbness, plan for dental follow-up.  Changed to Cipro twice daily for 3 days for UTI with plan to rescreen in 1 week.   Further history today -  Tongue swelling started few weeks ago. Started prior to antibiotics in ER.  Has not scheduled dental appointment yet.  Tongue swelling better.  No dysuria/frequency/hematuria.   Still feels a little weak with standing initially. Has to wait for a few seconds, then improves, but still feels wobbly if moving quickly.  4 days ago up and walking, got up from the chair, felt weak all of a sudden, both legs felt weak  and fell to floor. No injury. No LOC. No chest pain, no shaking/seizures. No incontinence. Called son, able to get to bed - felt better after a few hours, but still weak to get out of bed. Improved next day, still unsteady next day but better today.  Rare weak feeling.  By self at home. Has life alert if needed.  Taking amlodipine 5mg  at night, 2.5mg  in am.  Home blood pressures 135-140/70 Has walker  - not using Eating and drinking ok.   Chronic urinary incontinence - frequent urination at night.  Chronic use of allegra - 1/2 BID for allergies. Didn't like flonase nasal spray.   BP Readings from Last 3 Encounters:  11/04/21 122/76  10/25/21 (!) 155/74  10/24/21 (!) 177/89   Appt with cardiology on 3/15.   History Patient Active Problem List   Diagnosis Date Noted   Hypercalcemia 04/19/2021   Closed fracture of proximal end of left humerus 02/19/2020   Pain in joint of left shoulder 01/22/2020   Essential hypertension 02/14/2019   Dizziness and giddiness 02/14/2019   Past Medical History:  Diagnosis Date   Allergy    Cataract    GI bleed    Hypertension 02/2019   Newly diagnosed    Osteoporosis    Tuberculosis    Ulcer of gastric fundus, acute    Past Surgical History:  Procedure Laterality Date   ABDOMINAL HYSTERECTOMY  APPENDECTOMY     Bladder     Bladder tack   EYE SURGERY     Rectum     Rectal tear repair   Allergies  Allergen Reactions   Chocolate Anaphylaxis    Migraines    Prednisone Hypertension   Shellfish Allergy    Chocolate Flavor    Clindamycin    Clindamycin/Lincomycin    Codeine    Erythromycin    Erythromycin Base    Keflex [Cephalexin]    Lisinopril     Angioedema    Other    Penicillins    Sulfa Antibiotics    Prior to Admission medications   Medication Sig Start Date End Date Taking? Authorizing Provider  amLODipine (NORVASC) 2.5 MG tablet Take 1 tablet (2.5 mg total) by mouth in the morning. 04/26/21  Yes Wendie Agreste,  MD  amLODipine (NORVASC) 5 MG tablet TAKE 1 TABLET (5 MG TOTAL) BY MOUTH IN THE MORNING AND AT BEDTIME. 03/12/21  Yes Wendie Agreste, MD  ipratropium (ATROVENT) 0.06 % nasal spray Place 1 spray into both nostrils 4 (four) times daily. As needed for nasal congestion Patient not taking: Reported on 11/01/2021 02/25/21   Wendie Agreste, MD   Social History   Socioeconomic History   Marital status: Widowed    Spouse name: Not on file   Number of children: 3   Years of education: Not on file   Highest education level: Not on file  Occupational History   Occupation: Book Therapist, nutritional    Comment: Retired    Occupation: Higher education careers adviser     Comment: Retired   Tobacco Use   Smoking status: Never   Smokeless tobacco: Never  Scientific laboratory technician Use: Never used  Substance and Sexual Activity   Alcohol use: Yes    Comment: 1 Glass of Scotch a Night   Drug use: Never   Sexual activity: Not Currently  Other Topics Concern   Not on file  Social History Narrative   Has 3 children 2 daughters and a son, who are very attentive.    Lives alone in a townhouse.    Able to perform all ADL's   Exercises daily   Belongs to social clubs   Does line dancing.    Social Determinants of Health   Financial Resource Strain: Low Risk    Difficulty of Paying Living Expenses: Not hard at all  Food Insecurity: No Food Insecurity   Worried About Charity fundraiser in the Last Year: Never true   [place] in the Last Year: Never true  Transportation Needs: No Transportation Needs   Lack of Transportation (Medical): No   Lack of Transportation (Non-Medical): No  Physical Activity: Inactive   Days of Exercise per Week: 0 days   Minutes of Exercise per Session: 0 min  Stress: No Stress Concern Present   Feeling of Stress : Not at all  Social Connections: Moderately Integrated   Frequency of Communication with Friends and Family: Twice a week   Frequency of Social Gatherings with Friends and  Family: Twice a week   Attends Religious Services: More than 4 times per year   Active Member of [employer] or Organizations: Yes   Attends Archivist Meetings: 1 to 4 times per year   Marital Status: Widowed  Intimate Partner Violence: Not At Risk   Fear of Current or Ex-Partner: No   Emotionally Abused: No   Physically Abused: No  Sexually Abused: No    Review of Systems   Objective:   Vitals:   11/04/21 1149  BP: 122/76  Pulse: 72  Resp: 16  Temp: 97.8 F (36.6 C)  TempSrc: Temporal  SpO2: 97%  Weight: 115 lb 9.6 oz (52.4 kg)  Height: 5' (1.524 m)     Physical Exam Vitals reviewed.  Constitutional:      Appearance: Normal appearance. She is well-developed.  HENT:     Head: Normocephalic and atraumatic.  Eyes:     Conjunctiva/sclera: Conjunctivae normal.     Pupils: Pupils are equal, round, and reactive to light.  Neck:     Vascular: No carotid bruit.  Cardiovascular:     Rate and Rhythm: Normal rate and regular rhythm.     Heart sounds: Murmur (2-3/6 SEM. few ectopic beats.) heard.  Pulmonary:     Effort: Pulmonary effort is normal.     Breath sounds: Normal breath sounds.  Abdominal:     Palpations: Abdomen is soft. There is no pulsatile mass.     Tenderness: There is no abdominal tenderness.  Musculoskeletal:     Right lower leg: No edema.     Left lower leg: No edema.  Skin:    General: Skin is warm and dry.  Neurological:     General: No focal deficit present.     Mental Status: She is alert and oriented to person, place, and time.     Cranial Nerves: No cranial nerve deficit or facial asymmetry.     Motor: No weakness, seizure activity or pronator drift.     Comments: Equal upper extremity/lower extremity strength.  No focal weakness appreciated.  Psychiatric:        Mood and Affect: Mood normal.        Behavior: Behavior normal.    EKG: Sinus rhythm with occasional ectopic beat.  Right bundle branch block.  Less ectopic beats compared  to EKG on 10/24/2021 with no acute changes.  Right bundle branch block present previously.   Assessment & Plan:  NAREH MATZKE is a 88 y.o. female . Irregular heart rate - Plan: EKG 12-Lead PVC's (premature ventricular contractions) - Plan: EKG 12-Lead General weakness - Plan: Ambulatory referral to Neurology History of fall - Plan: Ambulatory referral to Neurology  -2 separate episodes of acute generalized weakness, resulting in fall as above without injury.  Initial evaluation through ER for similar symptoms with possible early UTI but otherwise reassuring testing.  PVCs noted on initial EKG in the ER with less frequent PVCs in office today.  Nonfocal neuro exam in office.  -Cardiology follow-up planned soon, we will also place neurology referral.   -She is on antihistamine for allergies but no recent changes, longstanding use.  Less likely cause.   ER precautions given if recurrence of symptoms   E. coli UTI - Plan: POCT urinalysis dipstick, Urine Culture -Recheck urinalysis and culture to see if persistent UTI but less likely cause of above symptoms.  Unlikely allergy to cephalosporin from ER causing tongue symptoms as she did have some symptoms prior to taking that medication, now reports improved tongue symptoms.  RTC/ER precautions if worsening.  No orders of the defined types were placed in this encounter.  Patient Instructions  Glad to see the reassuring work-up in the emergency room, but I am concerned with repeat weakness episode.  Exam today is reassuring but if you have another episode like you had earlier this week I do recommend evaluation through the  emergency room.  I will check the urine test, and refer you to neurology.  Keep follow-up with cardiology as planned.  I do recommend using a walker at this time to lessen risk of falling and to help with stability. Please let me know if there are any questions regarding your visit today or on the lab work from last visit or  emergency room visit.  Take care  Return to the clinic or go to the nearest emergency room if any of your symptoms worsen or new symptoms occur.      Signed,   Merri Ray, MD [place] 11/04/21 9:20 PM

## 2021-11-06 LAB — URINE CULTURE
MICRO NUMBER:: 13082423
Result:: NO GROWTH
SPECIMEN QUALITY:: ADEQUATE

## 2021-11-06 LAB — EXTRA URINE SPECIMEN

## 2021-11-09 ENCOUNTER — Other Ambulatory Visit: Payer: Self-pay

## 2021-11-09 ENCOUNTER — Ambulatory Visit (INDEPENDENT_AMBULATORY_CARE_PROVIDER_SITE_OTHER)
Admission: RE | Admit: 2021-11-09 | Discharge: 2021-11-09 | Disposition: A | Discharge status: Home / Self Care | Payer: Medicare Other | Source: Ambulatory Visit | Attending: Endocrinology | Admitting: Endocrinology

## 2021-11-09 DIAGNOSIS — M810 Age-related osteoporosis without current pathological fracture: Secondary | ICD-10-CM | POA: Diagnosis not present

## 2021-11-14 NOTE — Progress Notes (Deleted)
?Cardiology Office Note:   ? ?Date:  11/14/2021  ? ?ID:  Gina Pace, DOB [birth year], MRN [id number] ? ?PCP:  Wendie Agreste, MD ?  ?[facility] Providers ?Cardiologist:  Kirk Ruths, MD { ?Click to update primary MD,subspecialty MD or APP then REFRESH:1}   ? ?Referring MD: Wendie Agreste, MD  ? ?No chief complaint on file. ?*** ? ?History of Present Illness:   ? ?Gina Pace is a 88 y.o. female with a hx of angiodema with lisinopril.  CT chest 02/2019 showed dilated pulmonary artery and cardiomegaly.  Follow-up echocardiogram in July 2020 showed normal LVEF, mild LVH, mild diastolic dysfunction, moderate left atrial enlargement, moderate MR, moderate AI, and moderate pulmonic insufficiency.  Most recent echocardiogram 08/20/2020 showed LVEF 55 to 60%, normal RV, moderately elevated PASP, severely dilated left atrium, moderate MR, moderate TR, moderate AI.  She was last seen by Dr. Stanford Breed in clinic on 06/29/2020 and was doing well at that time. BP elevated, amlodipine not yet increased.  ? ?She was seen in [facility]/24/2021 for weakness. UA suggested possible early UTI.  ? ?She presents for scheduled follow up.  ? ? ?Hypertension ?NO ACEI/ARB ?Clarify amlodipine dose 2.5-5 ? ? ?Moderate AI ?Moderate MR ?- follow symptoms, consider repeating echo this Dec 2023 (2 years) ? ? ? ?Past Medical History:  ?Diagnosis Date  ? Allergy   ? Cataract   ? GI bleed   ? Hypertension 02/2019  ? Newly diagnosed   ? Osteoporosis   ? Tuberculosis   ? Ulcer of gastric fundus, acute   ? ? ?Past Surgical History:  ?Procedure Laterality Date  ? ABDOMINAL HYSTERECTOMY    ? APPENDECTOMY    ? Bladder    ? Bladder tack  ? EYE SURGERY    ? Rectum    ? Rectal tear repair  ? ? ?Current Medications: ?No outpatient medications have been marked as taking for the 11/17/21 encounter (Appointment) with Ledora Bottcher, Eleanor.  ?  ? ?Allergies:   Chocolate, Prednisone, Shellfish allergy, Chocolate flavor, Clindamycin,  Clindamycin/lincomycin, Codeine, Erythromycin, Erythromycin base, Keflex [cephalexin], Lisinopril, Other, Penicillins, and Sulfa antibiotics  ? ?Social History  ? ?Socioeconomic History  ? Marital status: Widowed  ?  Spouse name: Not on file  ? Number of children: 3  ? Years of education: Not on file  ? Highest education level: Not on file  ?Occupational History  ? Occupation: Clinical cytogeneticist  ?  Comment: Retired   ? Occupation: Higher education careers adviser   ?  Comment: Retired   ?Tobacco Use  ? Smoking status: Never  ? Smokeless tobacco: Never  ?Vaping Use  ? Vaping Use: Never used  ?Substance and Sexual Activity  ? Alcohol use: Yes  ?  Comment: 1 Glass of Scotch a Night  ? Drug use: Never  ? Sexual activity: Not Currently  ?Other Topics Concern  ? Not on file  ?Social History Narrative  ? Has 3 children 2 daughters and a son, who are very attentive.   ? Lives alone in a townhouse.   ? Able to perform all ADL's  ? Exercises daily  ? Belongs to social clubs  ? Does line dancing.   ? ?Social Determinants of Health  ? ?Financial Resource Strain: Low Risk   ? Difficulty of Paying Living Expenses: Not hard at all  ?Food Insecurity: No Food Insecurity  ? Worried About Charity fundraiser in the Last Year: Never true  ? Ran Out of Food  in the Last Year: Never true  ?Transportation Needs: No Transportation Needs  ? Lack of Transportation (Medical): No  ? Lack of Transportation (Non-Medical): No  ?Physical Activity: Inactive  ? Days of Exercise per Week: 0 days  ? Minutes of Exercise per Session: 0 min  ?Stress: No Stress Concern Present  ? Feeling of Stress : Not at all  ?Social Connections: Moderately Integrated  ? Frequency of Communication with Friends and Family: Twice a week  ? Frequency of Social Gatherings with Friends and Family: Twice a week  ? Attends Religious Services: More than 4 times per year  ? Active Member of Clubs or Organizations: Yes  ? Attends Archivist Meetings: 1 to 4 times per year  ? Marital Status:  Widowed  ?  ? ?Family History: ?The patient's ***family history includes CAD in her sister; CAD (age of onset: 73) in her father; CAD (age of onset: 21) in her mother; Congestive Heart Failure in her sister; Emphysema in her brother; Heart attack in her father and mother; Hypercalcemia in her sister. ? ?ROS:   ?Please see the history of present illness.    ?*** All other systems reviewed and are negative. ? ?EKGs/Labs/Other Studies Reviewed:   ? ?The following studies were reviewed today: ?*** ? ?EKG:  EKG is *** ordered today.  The ekg ordered today demonstrates *** ? ?Recent Labs: ?10/24/2021: ALT 9; BUN 20; Creatinine, Ser 0.75; Hemoglobin 12.8; Platelets 191; Potassium 4.0; Sodium 137  ?Recent Lipid Panel ?No results found for: CHOL, TRIG, HDL, CHOLHDL, VLDL, LDLCALC, LDLDIRECT ? ? ?Risk Assessment/Calculations:   ?{Does this patient have ATRIAL FIBRILLATION?:[id number]} ? ?    ? ?Physical Exam:   ? ?VS:  There were no vitals taken for this visit.   ? ?Wt Readings from Last 3 Encounters:  ?11/04/21 115 lb 9.6 oz (52.4 kg)  ?11/01/21 113 lb (51.3 kg)  ?10/25/21 113 lb 9.6 oz (51.5 kg)  ?  ? ?GEN: *** Well nourished, well developed in no acute distress ?HEENT: Normal ?NECK: No JVD; No carotid bruits ?LYMPHATICS: No lymphadenopathy ?CARDIAC: ***RRR, no murmurs, rubs, gallops ?RESPIRATORY:  Clear to auscultation without rales, wheezing or rhonchi  ?ABDOMEN: Soft, non-tender, non-distended ?MUSCULOSKELETAL:  No edema; No deformity  ?SKIN: Warm and dry ?NEUROLOGIC:  Alert and oriented x 3 ?PSYCHIATRIC:  Normal affect  ? ?ASSESSMENT:   ? ?No diagnosis found. ?PLAN:   ? ?In order of problems listed above: ? ?*** ? ?   ? ?{Are you ordering a CV Procedure (e.g. stress test, cath, DCCV, TEE, etc)?   Press F2        :[id number]}  ? ? ?Medication Adjustments/Labs and Tests Ordered: ?Current medicines are reviewed at length with the patient today.  Concerns regarding medicines are outlined above.  ?No orders of the defined  types were placed in this encounter. ? ?No orders of the defined types were placed in this encounter. ? ? ?There are no Patient Instructions on file for this visit.  ? ?Signed, ?Ledora Bottcher, PA  ?11/14/2021 7:25 PM    ?[place] ?

## 2021-11-17 ENCOUNTER — Ambulatory Visit: Payer: Medicare Other | Admitting: Cardiology

## 2021-11-17 ENCOUNTER — Ambulatory Visit: Payer: Medicare Other | Admitting: Physician Assistant

## 2021-11-25 ENCOUNTER — Ambulatory Visit (INDEPENDENT_AMBULATORY_CARE_PROVIDER_SITE_OTHER): Payer: Medicare Other | Admitting: Family Medicine

## 2021-11-25 ENCOUNTER — Encounter: Payer: Self-pay | Admitting: Family Medicine

## 2021-11-25 ENCOUNTER — Other Ambulatory Visit: Payer: Self-pay

## 2021-11-25 VITALS — BP 124/78 | HR 85 | Temp 98.1°F | Resp 16 | Ht 60.0 in | Wt 115.6 lb

## 2021-11-25 DIAGNOSIS — R531 Weakness: Secondary | ICD-10-CM

## 2021-11-25 DIAGNOSIS — R220 Localized swelling, mass and lump, head: Secondary | ICD-10-CM | POA: Diagnosis not present

## 2021-11-25 DIAGNOSIS — R200 Anesthesia of skin: Secondary | ICD-10-CM

## 2021-11-25 LAB — VITAMIN B12: Vitamin B-12: 288 pg/mL (ref 211–911)

## 2021-11-25 NOTE — Progress Notes (Signed)
? ?Subjective:  ?Patient ID: Gina Pace, female    DOB: November 25, 1924  Age: 88 y.o. MRN: [id number] ? ?CC:  ?Chief Complaint  ?Patient presents with  ? Weakness  ?  Pt reports legs are significantly improved notes some continued numbness in her finger tips   ? ? ?HPI ?[name] presents for  ? ?Generalized weakness: ?Follow-up from our second visit.  2 episodes of acute generalized weakness with fall at that time.  Possible early UTI for initial evaluation through ER but otherwise reassuring testing.  Few PVCs noted on initial EKG in the ER with less frequent PVCs in office.  Nonfocal neuro exam at last visit.  Neurology and cardiology follow-up planned with neurology referral.  Repeat urinalysis with culture indicating no growth at last visit. ?cardiology appointment April 25, endocrinology May 9, neurology June 26. ? ?Feels better than last visit, legs feel better. ?No further weakness or falls.  ?Walking without assistive device.  ?Numbness in finger tips - both hands, past 6 months. Fingers cold years prior. No color changes. Arm tingling if in certain position overnight, then gets better. Better lately.  ?Prior tongue issues have improved, but question of swelling on sides of tongue are swollen. More swollen when went to ER last month, more difficulty with speech with hitting denture for about a week.  no tongue pain, no difficulty swallowing or breathing. Reports tongue has felt swollen for years on sides. Appt with dentist in few weeks.  ?Only Rx amlodipine and otc vit D, allegra, tylenol.  ? ?Lab Results  ?Component Value Date  ? [id number] 11/25/2021  ? ? ?History ?Patient Active Problem List  ? Diagnosis Date Noted  ? Hypercalcemia 04/19/2021  ? Closed fracture of proximal end of left humerus 02/19/2020  ? Pain in joint of left shoulder 01/22/2020  ? Essential hypertension 02/14/2019  ? Dizziness and giddiness 02/14/2019  ? ?Past Medical History:  ?Diagnosis Date  ? Allergy   ? Cataract    ? GI bleed   ? Hypertension 02/2019  ? Newly diagnosed   ? Osteoporosis   ? Tuberculosis   ? Ulcer of gastric fundus, acute   ? ?Past Surgical History:  ?Procedure Laterality Date  ? ABDOMINAL HYSTERECTOMY    ? APPENDECTOMY    ? Bladder    ? Bladder tack  ? EYE SURGERY    ? Rectum    ? Rectal tear repair  ? ?Allergies  ?Allergen Reactions  ? Chocolate Anaphylaxis  ?  Migraines ?  ? Prednisone Hypertension  ? Shellfish Allergy   ? Chocolate Flavor   ? Clindamycin   ? Clindamycin/Lincomycin   ? Codeine   ? Erythromycin   ? Erythromycin Base   ? Keflex [Cephalexin]   ? Lisinopril   ?  Angioedema ?  ? Other   ? Penicillins   ? Sulfa Antibiotics   ? ?Prior to Admission medications   ?Medication Sig Start Date End Date Taking? Authorizing Provider  ?amLODipine (NORVASC) 2.5 MG tablet Take 1 tablet (2.5 mg total) by mouth in the morning. 04/26/21  Yes Wendie Agreste, MD  ?amLODipine (NORVASC) 5 MG tablet TAKE 1 TABLET (5 MG TOTAL) BY MOUTH IN THE MORNING AND AT BEDTIME. 03/12/21  Yes Wendie Agreste, MD  ?ipratropium (ATROVENT) 0.06 % nasal spray Place 1 spray into both nostrils 4 (four) times daily. As needed for nasal congestion ?Patient not taking: Reported on 11/01/2021 02/25/21   Wendie Agreste, MD  ? ?Social  History  ? ?Socioeconomic History  ? Marital status: Widowed  ?  Spouse name: Not on file  ? Number of children: 3  ? Years of education: Not on file  ? Highest education level: Not on file  ?Occupational History  ? Occupation: Clinical cytogeneticist  ?  Comment: Retired   ? Occupation: Higher education careers adviser   ?  Comment: Retired   ?Tobacco Use  ? Smoking status: Never  ? Smokeless tobacco: Never  ?Vaping Use  ? Vaping Use: Never used  ?Substance and Sexual Activity  ? Alcohol use: Yes  ?  Comment: 1 Glass of Scotch a Night  ? Drug use: Never  ? Sexual activity: Not Currently  ?Other Topics Concern  ? Not on file  ?Social History Narrative  ? Has 3 children 2 daughters and a son, who are very attentive.   ? Lives alone in  a townhouse.   ? Able to perform all ADL's  ? Exercises daily  ? Belongs to social clubs  ? Does line dancing.   ? ?Social Determinants of Health  ? ?Financial Resource Strain: Low Risk   ? Difficulty of Paying Living Expenses: Not hard at all  ?Food Insecurity: No Food Insecurity  ? Worried About Charity fundraiser in the Last Year: Never true  ? Ran Out of Food in the Last Year: Never true  ?Transportation Needs: No Transportation Needs  ? Lack of Transportation (Medical): No  ? Lack of Transportation (Non-Medical): No  ?Physical Activity: Inactive  ? Days of Exercise per Week: 0 days  ? Minutes of Exercise per Session: 0 min  ?Stress: No Stress Concern Present  ? Feeling of Stress : Not at all  ?Social Connections: Moderately Integrated  ? Frequency of Communication with Friends and Family: Twice a week  ? Frequency of Social Gatherings with Friends and Family: Twice a week  ? Attends Religious Services: More than 4 times per year  ? Active Member of Clubs or Organizations: Yes  ? Attends Archivist Meetings: 1 to 4 times per year  ? Marital Status: Widowed  ?Intimate Partner Violence: Not At Risk  ? Fear of Current or Ex-Partner: No  ? Emotionally Abused: No  ? Physically Abused: No  ? Sexually Abused: No  ? ? ?Review of Systems ? ?Per HPI.  ?Objective:  ? ?Vitals:  ? 11/25/21 1318  ?BP: 124/78  ?Pulse: 85  ?Resp: 16  ?Temp: 98.1 ?F (36.7 ?C)  ?TempSrc: Temporal  ?SpO2: 97%  ?Weight: 115 lb 9.6 oz (52.4 kg)  ?Height: 5' (1.524 m)  ? ? ? ?Physical Exam ?Vitals reviewed.  ?Constitutional:   ?   Appearance: She is well-developed.  ?HENT:  ?   Head: Normocephalic and atraumatic.  ?   Mouth/Throat:  ?   Mouth: Mucous membranes are moist.  ?   Comments: Chronic slightly large tongue without any tongue coating or lesions.  Able to speak without difficulty.  Clearing secretions normally without stridor or difficulty. ?Eyes:  ?   Conjunctiva/sclera: Conjunctivae normal.  ?   Pupils: Pupils are equal, round,  and reactive to light.  ?Neck:  ?   Vascular: No carotid bruit.  ?Cardiovascular:  ?   Rate and Rhythm: Normal rate and regular rhythm.  ?   Heart sounds: Normal heart sounds.  ?Pulmonary:  ?   Effort: Pulmonary effort is normal.  ?   Breath sounds: Normal breath sounds.  ?Abdominal:  ?   Palpations: Abdomen is soft.  There is no pulsatile mass.  ?   Tenderness: There is no abdominal tenderness.  ?Musculoskeletal:  ?   Comments: Bilateral hands with skin intact, no cyanosis, cap refill less than 1s at ft's. Equal/intact grip strength.sensation intact at fingertips on current exam. Pain free neck ROM.   ?Skin: ?   General: Skin is warm and dry.  ?Neurological:  ?   General: No focal deficit present.  ?   Mental Status: She is alert and oriented to person, place, and time.  ?   Motor: No weakness.  ?   Comments: Equal face movements, no focal weakness. Equal ue/le strength.   ?Psychiatric:     ?   Mood and Affect: Mood normal.     ?   Behavior: Behavior normal.  ? ? ? ?Assessment & Plan:  ?SKYLYNN BURKLEY is a 88 y.o. female . ?Tongue swelling - Plan: B12 ? -Longstanding symptoms by report as above with worsening at time of ER visit, now improved.  She is not on ACE inhibitor.  Less likely angioedema.  No lip swelling or posterior oropharynx symptoms.  With associated finger numbness we will check B12, but if that is normal would recommend evaluation with ENT.  RTC/ER precautions if acute worsening ? ?Finger numbness - Plan: B12 ? -As above we will check B12, possible cervical spine source, mild degenerative disc disease and mild degenerative facet disease bilaterally on CT cervical spine in 2021. ? ?General weakness ?Improved since last visit.  Urine culture was negative at that time.  RTC/ER precautions given.  Plan for cardiology and neurology follow-up as above. ? ?No orders of the defined types were placed in this encounter. ? ?Patient Instructions  ?Keep follow-up with neurology as planned.  I think that the  tingling in the fingertips could be related to your neck -  especially if you notice some numbness or tingling in the arms with certain positions.  If any worsening symptoms return to see me prior to s

## 2021-11-25 NOTE — Patient Instructions (Addendum)
Keep follow-up with neurology as planned.  I think that the tingling in the fingertips could be related to your neck -  especially if you notice some numbness or tingling in the arms with certain positions.  If any worsening symptoms return to see me prior to seeing neurology.  ?I will check a B12 level for the tongue swelling and finger symptoms, but if that is normal I would like you to meet with ear nose and throat.  Keep follow-up with dentist as planned.  Keep follow-up with cardiology and neurology as planned.  If any worsening or new symptoms please return for recheck.  ? ?Return to the clinic or go to the nearest emergency room if any of your symptoms worsen or new symptoms occur. ? ?

## 2021-12-17 ENCOUNTER — Other Ambulatory Visit: Payer: Self-pay | Admitting: Family Medicine

## 2021-12-17 DIAGNOSIS — I10 Essential (primary) hypertension: Secondary | ICD-10-CM

## 2021-12-23 NOTE — Progress Notes (Deleted)
Cardiology Office Note:    Date:  12/23/2021   ID:  Gina Pace, DOB 09-Dec-1924, MRN [id number]  PCP:  Wendie Agreste, MD   [place] Providers Cardiologist:  Kirk Ruths, MD { Click to update primary MD,subspecialty MD or APP then REFRESH:1}    Referring MD: Wendie Agreste, MD   No chief complaint on file. ***  History of Present Illness:    Gina MANTERNACH is a 88 y.o. female with a hx of angiodema with lisinopril.  CT chest 02/2019 showed dilated pulmonary artery and cardiomegaly.  Follow-up echocardiogram in July 2020 showed normal LVEF, mild LVH, mild diastolic dysfunction, moderate left atrial enlargement, moderate MR, moderate AI, and moderate pulmonic insufficiency.  Most recent echocardiogram 08/20/2020 showed LVEF 55 to 60%, normal RV, moderately elevated PASP, severely dilated left atrium, moderate MR, moderate TR, moderate AI.  She was last seen by Dr. Stanford Breed in clinic on 06/29/2020 and was doing well at that time. BP elevated, amlodipine not yet increased.   She was seen in [facility] 10/24/2021 for weakness. UA suggested possible early UTI. She has since followed up with PCP, urine culture no growth. She complains of tongue and fingertip numbness, not on ACEI, not felt related to angiodema. B12 checked and WNL. She was referred for ENT evaluation.   She presents for scheduled follow up.    Hypertension NO ACEI/ARB - hx of angiodema Clarify amlodipine dose 2.5-5   Moderate AI Moderate MR - follow symptoms, consider repeating echo this Dec 2023 (2 years)        Past Medical History:  Diagnosis Date   Allergy    Cataract    GI bleed    Hypertension 02/2019   Newly diagnosed    Osteoporosis    Tuberculosis    Ulcer of gastric fundus, acute     Past Surgical History:  Procedure Laterality Date   ABDOMINAL HYSTERECTOMY     APPENDECTOMY     Bladder     Bladder tack   EYE SURGERY     Rectum     Rectal tear repair    Current  Medications: No outpatient medications have been marked as taking for the 12/28/21 encounter (Appointment) with Ledora Bottcher, [place].     Allergies:   Chocolate, Prednisone, Shellfish allergy, Chocolate flavor, Clindamycin, Clindamycin/lincomycin, Codeine, Erythromycin, Erythromycin base, Keflex [cephalexin], Lisinopril, Other, Penicillins, and Sulfa antibiotics   Social History   Socioeconomic History   Marital status: Widowed    Spouse name: Not on file   Number of children: 3   Years of education: Not on file   Highest education level: Not on file  Occupational History   Occupation: Book Therapist, nutritional    Comment: Retired    Occupation: Higher education careers adviser     Comment: Retired   Tobacco Use   Smoking status: Never   Smokeless tobacco: Never  Scientific laboratory technician Use: Never used  Substance and Sexual Activity   Alcohol use: Yes    Comment: 1 Glass of Scotch a Night   Drug use: Never   Sexual activity: Not Currently  Other Topics Concern   Not on file  Social History Narrative   Has 3 children 2 daughters and a son, who are very attentive.    Lives alone in a townhouse.    Able to perform all ADL's   Exercises daily   Belongs to social clubs   Does line dancing.    Social Determinants of Health  Financial Resource Strain: Low Risk    Difficulty of Paying Living Expenses: Not hard at all  Food Insecurity: No Food Insecurity   Worried About Charity fundraiser in the Last Year: Never true   Ran Out of Food in the Last Year: Never true  Transportation Needs: No Transportation Needs   Lack of Transportation (Medical): No   Lack of Transportation (Non-Medical): No  Physical Activity: Inactive   Days of Exercise per Week: 0 days   Minutes of Exercise per Session: 0 min  Stress: No Stress Concern Present   Feeling of Stress : Not at all  Social Connections: Moderately Integrated   Frequency of Communication with Friends and Family: Twice a week   Frequency of Social  Gatherings with Friends and Family: Twice a week   Attends Religious Services: More than 4 times per year   Active Member of [employer] or Organizations: Yes   Attends Archivist Meetings: 1 to 4 times per year   Marital Status: Widowed     Family History: The patient's ***family history includes CAD in her sister; CAD (age of onset: 48) in her father; CAD (age of onset: 78) in her mother; Congestive Heart Failure in her sister; Emphysema in her brother; Heart attack in her father and mother; Hypercalcemia in her sister.  ROS:   Please see the history of present illness.    *** All other systems reviewed and are negative.  EKGs/Labs/Other Studies Reviewed:    The following studies were reviewed today: ***  EKG:  EKG is *** ordered today.  The ekg ordered today demonstrates ***  Recent Labs: 10/24/2021: ALT 9; BUN 20; Creatinine, Ser 0.75; Hemoglobin 12.8; Platelets 191; Potassium 4.0; Sodium 137  Recent Lipid Panel No results found for: CHOL, TRIG, HDL, CHOLHDL, VLDL, LDLCALC, LDLDIRECT   Risk Assessment/Calculations:   {Does this patient have ATRIAL FIBRILLATION?[phone]}       Physical Exam:    VS:  There were no vitals taken for this visit.    Wt Readings from Last 3 Encounters:  11/25/21 115 lb 9.6 oz (52.4 kg)  11/04/21 115 lb 9.6 oz (52.4 kg)  11/01/21 113 lb (51.3 kg)     GEN: *** Well nourished, well developed in no acute distress HEENT: Normal NECK: No JVD; No carotid bruits LYMPHATICS: No lymphadenopathy CARDIAC: ***RRR, no murmurs, rubs, gallops RESPIRATORY:  Clear to auscultation without rales, wheezing or rhonchi  ABDOMEN: Soft, non-tender, non-distended MUSCULOSKELETAL:  No edema; No deformity  SKIN: Warm and dry NEUROLOGIC:  Alert and oriented x 3 PSYCHIATRIC:  Normal affect   ASSESSMENT:    No diagnosis found. PLAN:    In order of problems listed above:  ***      {Are you ordering a CV Procedure (e.g. stress test, cath, DCCV,  TEE, etc)?   Press F2        :[id number]}    Medication Adjustments/Labs and Tests Ordered: Current medicines are reviewed at length with the patient today.  Concerns regarding medicines are outlined above.  No orders of the defined types were placed in this encounter.  No orders of the defined types were placed in this encounter.   There are no Patient Instructions on file for this visit.   Signed, Ledora Bottcher, [place]  12/23/2021 10:42 AM    [facility]

## 2021-12-28 ENCOUNTER — Ambulatory Visit: Payer: Medicare Other | Admitting: Physician Assistant

## 2022-01-11 ENCOUNTER — Ambulatory Visit: Payer: Medicare Other | Admitting: Endocrinology

## 2022-01-18 ENCOUNTER — Ambulatory Visit: Payer: Medicare Other | Admitting: Physician Assistant

## 2022-01-26 ENCOUNTER — Ambulatory Visit: Payer: Medicare Other | Admitting: Physician Assistant

## 2022-01-26 ENCOUNTER — Encounter: Payer: Self-pay | Admitting: Physician Assistant

## 2022-01-26 VITALS — BP 185/86 | HR 72 | Ht 60.0 in | Wt 115.4 lb

## 2022-01-26 DIAGNOSIS — I493 Ventricular premature depolarization: Secondary | ICD-10-CM

## 2022-01-26 DIAGNOSIS — I10 Essential (primary) hypertension: Secondary | ICD-10-CM | POA: Diagnosis not present

## 2022-01-26 MED ORDER — AMLODIPINE BESYLATE 5 MG PO TABS: ORAL_TABLET | ORAL | 0 refills | Status: DC

## 2022-01-26 NOTE — Patient Instructions (Addendum)
Medication Instructions:  INCREASE Amlodipine to 5 mg in the mornings and 2.5 mg in the afternoons/evenings  *If you need a refill on your cardiac medications before your next appointment, please call your pharmacy*  Lab Work: NONE ordered at this time of appointment   If you have labs (blood work) drawn today and your tests are completely normal, you will receive your results only by: Gina Pace (if you have MyChart) OR A paper copy in the mail If you have any lab test that is abnormal or we need to change your treatment, we will call you to review the results.  Testing/Procedures: NONE ordered at this time of appointment   Follow-Up: At [facility], you and your health needs are our priority.  As part of our continuing mission to provide you with exceptional heart care, we have created designated Provider Care Teams.  These Care Teams include your primary Cardiologist (physician) and Advanced Practice Providers (APPs -  Physician Assistants and Nurse Practitioners) who all work together to provide you with the care you need, when you need it.     Your next appointment:   3 month(s)  The format for your next appointment:   In Person  Provider:   Almyra Deforest, PA-C        Other Instructions DECREASE Caffeine to 1 cup of coffee a day  Low-Sodium Eating Plan Sodium, which is an element that makes up salt, helps you maintain a healthy balance of fluids in your body. Too much sodium can increase your blood pressure and cause fluid and waste to be held in your body. Your health care provider or dietitian may recommend following this plan if you have high blood pressure (hypertension), kidney disease, liver disease, or heart failure. Eating less sodium can help lower your blood pressure, reduce swelling, and protect your heart, liver, and kidneys. What are tips for following this plan? Reading food labels The Nutrition Facts label lists the amount of sodium in one serving of the  food. If you eat more than one serving, you must multiply the listed amount of sodium by the number of servings. Choose foods with less than 140 mg of sodium per serving. Avoid foods with 300 mg of sodium or more per serving. Shopping  Look for lower-sodium products, often labeled as "low-sodium" or "no salt added." Always check the sodium content, even if foods are labeled as "unsalted" or "no salt added." Buy fresh foods. Avoid canned foods and pre-made or frozen meals. Avoid canned, cured, or processed meats. Buy breads that have less than 80 mg of sodium per slice. Cooking  Eat more home-cooked food and less restaurant, buffet, and fast food. Avoid adding salt when cooking. Use salt-free seasonings or herbs instead of table salt or sea salt. Check with your health care provider or pharmacist before using salt substitutes. Cook with plant-based oils, such as canola, sunflower, or olive oil. Meal planning When eating at a restaurant, ask that your food be prepared with less salt or no salt, if possible. Avoid dishes labeled as brined, pickled, cured, smoked, or made with soy sauce, miso, or teriyaki sauce. Avoid foods that contain MSG (monosodium glutamate). MSG is sometimes added to [place] food, bouillon, and some canned foods. Make meals that can be grilled, baked, poached, roasted, or steamed. These are generally made with less sodium. General information Most people on this plan should limit their sodium intake to 1,500-2,000 mg (milligrams) of sodium each day. What foods should I eat? Fruits  Fresh, frozen, or canned fruit. Fruit juice. Vegetables Fresh or frozen vegetables. "No salt added" canned vegetables. "No salt added" tomato sauce and paste. Low-sodium or reduced-sodium tomato and vegetable juice. Grains Low-sodium cereals, including oats, puffed wheat and rice, and shredded wheat. Low-sodium crackers. Unsalted rice. Unsalted pasta. Low-sodium bread. Whole-grain breads and  whole-grain pasta. Meats and other proteins Fresh or frozen (no salt added) meat, poultry, seafood, and fish. Low-sodium canned tuna and salmon. Unsalted nuts. Dried peas, beans, and lentils without added salt. Unsalted canned beans. Eggs. Unsalted nut butters. Dairy Milk. Soy milk. Cheese that is naturally low in sodium, such as ricotta cheese, fresh mozzarella, or Swiss cheese. Low-sodium or reduced-sodium cheese. Cream cheese. Yogurt. Seasonings and condiments Fresh and dried herbs and spices. Salt-free seasonings. Low-sodium mustard and ketchup. Sodium-free salad dressing. Sodium-free light mayonnaise. Fresh or refrigerated horseradish. Lemon juice. Vinegar. Other foods Homemade, reduced-sodium, or low-sodium soups. Unsalted popcorn and pretzels. Low-salt or salt-free chips. The items listed above may not be a complete list of foods and beverages you can eat. Contact a dietitian for more information. What foods should I avoid? Vegetables Sauerkraut, pickled vegetables, and relishes. Olives. [place] fries. Onion rings. Regular canned vegetables (not low-sodium or reduced-sodium). Regular canned tomato sauce and paste (not low-sodium or reduced-sodium). Regular tomato and vegetable juice (not low-sodium or reduced-sodium). Frozen vegetables in sauces. Grains Instant hot cereals. Bread stuffing, pancake, and biscuit mixes. Croutons. Seasoned rice or pasta mixes. Noodle soup cups. Boxed or frozen macaroni and cheese. Regular salted crackers. Self-rising flour. Meats and other proteins Meat or fish that is salted, canned, smoked, spiced, or pickled. Precooked or cured meat, such as sausages or meat loaves. [name]. Ham. Pepperoni. Hot dogs. Corned beef. Chipped beef. Salt pork. Jerky. Pickled herring. Anchovies and sardines. Regular canned tuna. Salted nuts. Dairy Processed cheese and cheese spreads. Hard cheeses. Cheese curds. Blue cheese. Feta cheese. String cheese. Regular cottage cheese.  Buttermilk. Canned milk. Fats and oils Salted butter. Regular margarine. Ghee. Bacon fat. Seasonings and condiments Onion salt, garlic salt, seasoned salt, table salt, and sea salt. Canned and packaged gravies. Worcestershire sauce. Tartar sauce. Barbecue sauce. Teriyaki sauce. Soy sauce, including reduced-sodium. Steak sauce. Fish sauce. Oyster sauce. Cocktail sauce. Horseradish that you find on the shelf. Regular ketchup and mustard. Meat flavorings and tenderizers. Bouillon cubes. Hot sauce. Pre-made or packaged marinades. Pre-made or packaged taco seasonings. Relishes. Regular salad dressings. Salsa. Other foods Salted popcorn and pretzels. Corn chips and puffs. Potato and tortilla chips. Canned or dried soups. Pizza. Frozen entrees and pot pies. The items listed above may not be a complete list of foods and beverages you should avoid. Contact a dietitian for more information. Summary Eating less sodium can help lower your blood pressure, reduce swelling, and protect your heart, liver, and kidneys. Most people on this plan should limit their sodium intake to 1,500-2,000 mg (milligrams) of sodium each day. Canned, boxed, and frozen foods are high in sodium. Restaurant foods, fast foods, and pizza are also very high in sodium. You also get sodium by adding salt to food. Try to cook at home, eat more fresh fruits and vegetables, and eat less fast food and canned, processed, or prepared foods. This information is not intended to replace advice given to you by your health care provider. Make sure you discuss any questions you have with your health care provider. Document Revised: 09/27/2019 Document Reviewed: 07/24/2019 Elsevier Patient Education  Gina Pace

## 2022-01-26 NOTE — Progress Notes (Signed)
Cardiology Office Note:    Date:  01/28/2022   ID:  Gina Pace, DOB 16-Jun-1925, MRN [id number]  PCP:  Wendie Agreste, MD   [facility] Providers Cardiologist:  Kirk Ruths, MD     Referring MD: Wendie Agreste, MD   Chief Complaint  Patient presents with   Follow-up    Seen for Dr. Stanford Breed    History of Present Illness:    Gina Pace is a 88 y.o. female with a hx of hypertension, osteoporosis, and history of TB.  She also has a history of angioedema on lisinopril.  Chest CT in June 2020 showed a dilated pulmonary artery and cardiomegaly.  Echocardiogram obtained in July 2020 showed normal LV function, mild LVH, mild diastolic dysfunction, moderate LAE, moderate MR, moderate AI and moderate pulmonary insufficiency.  Lower extremity venous Doppler in September 2021 showed no DVT.  Patient was last seen by Dr. Stanford Breed in October 2021 at which time she was doing well.  More recently, patient was seen in the ED on 10/24/2021, renal function and electrolyte okay.  Urine culture positive for E. coli UTI.  CT of the head showed no intracranial abnormality.  Chest x-ray showed no acute abnormality either she was seen by her PCP in November 25, 2021.  Patient presents today for follow-up.  She has decreased her amlodipine about 2 months ago, blood pressure today is quite high at 185/86.  She was previously on 5 mg amlodipine at night and a 2.5 mg amlodipine during the day.  She is currently on 2.5 mg twice a day of amlodipine.  I asked her to resume on the higher dose of amlodipine.  However instead of taking the higher dose at night, I asked her to take the higher dose in the morning to help cover daytime elevated blood pressure.  She will take 5 mg amlodipine in the a.m. and a 2.5 mg amlodipine EPF.  EKG today shows ventricular bigeminy, however patient has no cardiac awareness of PVCs.  She drink a lot of coffee and tea on a daily basis.  I asked her to limit total amount  of caffeine to a single cup of coffee in the morning, she should cut out all other caffeine intake.  I plan to reassess the patient in 3 months with repeat EKG.   Past Medical History:  Diagnosis Date   Allergy    Cataract    GI bleed    Hypertension 02/2019   Newly diagnosed    Osteoporosis    Tuberculosis    Ulcer of gastric fundus, acute     Past Surgical History:  Procedure Laterality Date   ABDOMINAL HYSTERECTOMY     APPENDECTOMY     Bladder     Bladder tack   EYE SURGERY     Rectum     Rectal tear repair    Current Medications: Current Meds  Medication Sig   Acetaminophen 325 MG CAPS Tylenol   fexofenadine (ALLEGRA) 60 MG tablet Take by mouth.   [DISCONTINUED] amLODipine (NORVASC) 5 MG tablet TAKE 1 TABLET (5 MG TOTAL) BY MOUTH IN THE MORNING AND AT BEDTIME.     Allergies:   Chocolate, Prednisone, Shellfish allergy, Chocolate flavor, Clindamycin, Clindamycin/lincomycin, Codeine, Erythromycin, Erythromycin base, Keflex [cephalexin], Lisinopril, Other, Penicillins, and Sulfa antibiotics   Social History   Socioeconomic History   Marital status: Widowed    Spouse name: Not on file   Number of children: 3   Years of education: Not  on file   Highest education level: Not on file  Occupational History   Occupation: Clinical cytogeneticist    Comment: Retired    Occupation: Higher education careers adviser     Comment: Retired   Tobacco Use   Smoking status: Never   Smokeless tobacco: Never  Scientific laboratory technician Use: Never used  Substance and Sexual Activity   Alcohol use: Yes    Comment: 1 Glass of Scotch a Night   Drug use: Never   Sexual activity: Not Currently  Other Topics Concern   Not on file  Social History Narrative   Has 3 children 2 daughters and a son, who are very attentive.    Lives alone in a townhouse.    Able to perform all ADL's   Exercises daily   Belongs to social clubs   Does line dancing.    Social Determinants of Health   Financial Resource Strain: Low  Risk    Difficulty of Paying Living Expenses: Not hard at all  Food Insecurity: No Food Insecurity   Worried About Charity fundraiser in the Last Year: Never true   [place] in the Last Year: Never true  Transportation Needs: No Transportation Needs   Lack of Transportation (Medical): No   Lack of Transportation (Non-Medical): No  Physical Activity: Inactive   Days of Exercise per Week: 0 days   Minutes of Exercise per Session: 0 min  Stress: No Stress Concern Present   Feeling of Stress : Not at all  Social Connections: Moderately Integrated   Frequency of Communication with Friends and Family: Twice a week   Frequency of Social Gatherings with Friends and Family: Twice a week   Attends Religious Services: More than 4 times per year   Active Member of [employer] or Organizations: Yes   Attends Archivist Meetings: 1 to 4 times per year   Marital Status: Widowed     Family History: The patient's family history includes CAD in her sister; CAD (age of onset: 2) in her father; CAD (age of onset: 77) in her mother; Congestive Heart Failure in her sister; Emphysema in her brother; Heart attack in her father and mother; Hypercalcemia in her sister.  ROS:   Please see the history of present illness.     All other systems reviewed and are negative.  EKGs/Labs/Other Studies Reviewed:    The following studies were reviewed today:  Echo 08/20/2020  1. Left ventricular ejection fraction, by estimation, is 55 to 60%. The  left ventricle has normal function. The left ventricle has no regional  wall motion abnormalities. Left ventricular diastolic parameters are  indeterminate.   2. Right ventricular systolic function is normal. The right ventricular  size is normal. There is moderately elevated pulmonary artery systolic  pressure.   3. Left atrial size was severely dilated.   4. The mitral valve is grossly normal. Moderate mitral valve  regurgitation.   5. Tricuspid valve  regurgitation is moderate.   6. The aortic valve is tricuspid. There is mild calcification of the  aortic valve. Aortic valve regurgitation is moderate.   7. The inferior vena cava is dilated in size with <50% respiratory  variability, suggesting right atrial pressure of 15 mmHg.   Comparison(s): A prior study was performed on 03/20/2019. Increase in left  atrial size and tricuspid regurgitation; stable aortic regurgitation and  mitral regurgitation.   EKG:  EKG is ordered today.  The ekg ordered today demonstrates sinus  rhythm with ventricular bigeminy.  Recent Labs: 10/24/2021: ALT 9; BUN 20; Creatinine, Ser 0.75; Hemoglobin 12.8; Platelets 191; Potassium 4.0; Sodium 137  Recent Lipid Panel No results found for: CHOL, TRIG, HDL, CHOLHDL, VLDL, LDLCALC, LDLDIRECT   Risk Assessment/Calculations:           Physical Exam:    VS:  BP (!) 185/86   Pulse 72   Ht 5' (1.524 m)   Wt 115 lb 6.4 oz (52.3 kg)   SpO2 100%   BMI 22.54 kg/m     Wt Readings from Last 3 Encounters:  01/26/22 115 lb 6.4 oz (52.3 kg)  11/25/21 115 lb 9.6 oz (52.4 kg)  11/04/21 115 lb 9.6 oz (52.4 kg)     GEN:  Well nourished, well developed in no acute distress HEENT: Normal NECK: No JVD; No carotid bruits LYMPHATICS: No lymphadenopathy CARDIAC: RRR, no murmurs, rubs, gallops RESPIRATORY:  Clear to auscultation without rales, wheezing or rhonchi  ABDOMEN: Soft, non-tender, non-distended MUSCULOSKELETAL:  No edema; No deformity  SKIN: Warm and dry NEUROLOGIC:  Alert and oriented x 3 PSYCHIATRIC:  Normal affect   ASSESSMENT:    1. PVC (premature ventricular contraction)   2. Essential hypertension    PLAN:    In order of problems listed above:  Frequent PVCs: Patient has bigeminy on EKG today.  She has no cardiac awareness.  I recommend she cut back on caffeinated drinks.  Will reassess on the next follow-up in 41-month if still has frequent PVCs, will consider beta-blocker.  Given her advanced  age of 88years old, I did not order echocardiogram as she would not be a candidate for any invasive study.  Hypertension: Blood pressure is elevated after she decreased amlodipine dosage, I recommended she restart on the previous higher dose of amlodipine.           Medication Adjustments/Labs and Tests Ordered: Current medicines are reviewed at length with the patient today.  Concerns regarding medicines are outlined above.  Orders Placed This Encounter  Procedures   EKG 12-Lead   Meds ordered this encounter  Medications   amLODipine (NORVASC) 5 MG tablet    Sig: Take 1 tablet (5 mg total) by mouth in the morning AND 0.5 tablets (2.5 mg total) every evening.    Dispense:  135 tablet    Refill:  0    Patient Instructions  Medication Instructions:  INCREASE Amlodipine to 5 mg in the mornings and 2.5 mg in the afternoons/evenings  *If you need a refill on your cardiac medications before your next appointment, please call your pharmacy*  Lab Work: NONE ordered at this time of appointment   If you have labs (blood work) drawn today and your tests are completely normal, you will receive your results only by: [place](if you have MyChart) OR A paper copy in the mail If you have any lab test that is abnormal or we need to change your treatment, we will call you to review the results.  Testing/Procedures: NONE ordered at this time of appointment   Follow-Up: [facility] you and your health needs are our priority.  As part of our continuing mission to provide you with exceptional heart care, we have created designated Provider Care Teams.  These Care Teams include your primary Cardiologist (physician) and Advanced Practice Providers (APPs -  Physician Assistants and Nurse Practitioners) who all work together to provide you with the care you need, when you need it.  Your next appointment:   3 month(s)  The format for your next appointment:   In  Person  Provider:   Almyra Deforest, PA-C        Other Instructions DECREASE Caffeine to 1 cup of coffee a day  Low-Sodium Eating Plan Sodium, which is an element that makes up salt, helps you maintain a healthy balance of fluids in your body. Too much sodium can increase your blood pressure and cause fluid and waste to be held in your body. Your health care provider or dietitian may recommend following this plan if you have high blood pressure (hypertension), kidney disease, liver disease, or heart failure. Eating less sodium can help lower your blood pressure, reduce swelling, and protect your heart, liver, and kidneys. What are tips for following this plan? Reading food labels The Nutrition Facts label lists the amount of sodium in one serving of the food. If you eat more than one serving, you must multiply the listed amount of sodium by the number of servings. Choose foods with less than 140 mg of sodium per serving. Avoid foods with 300 mg of sodium or more per serving. Shopping  Look for lower-sodium products, often labeled as "low-sodium" or "no salt added." Always check the sodium content, even if foods are labeled as "unsalted" or "no salt added." Buy fresh foods. Avoid canned foods and pre-made or frozen meals. Avoid canned, cured, or processed meats. Buy breads that have less than 80 mg of sodium per slice. Cooking  Eat more home-cooked food and less restaurant, buffet, and fast food. Avoid adding salt when cooking. Use salt-free seasonings or herbs instead of table salt or sea salt. Check with your health care provider or pharmacist before using salt substitutes. Cook with plant-based oils, such as canola, sunflower, or olive oil. Meal planning When eating at a restaurant, ask that your food be prepared with less salt or no salt, if possible. Avoid dishes labeled as brined, pickled, cured, smoked, or made with soy sauce, miso, or teriyaki sauce. Avoid foods that contain MSG  (monosodium glutamate). MSG is sometimes added to [place] food, bouillon, and some canned foods. Make meals that can be grilled, baked, poached, roasted, or steamed. These are generally made with less sodium. General information Most people on this plan should limit their sodium intake to 1,500-2,000 mg (milligrams) of sodium each day. What foods should I eat? Fruits Fresh, frozen, or canned fruit. Fruit juice. Vegetables Fresh or frozen vegetables. "No salt added" canned vegetables. "No salt added" tomato sauce and paste. Low-sodium or reduced-sodium tomato and vegetable juice. Grains Low-sodium cereals, including oats, puffed wheat and rice, and shredded wheat. Low-sodium crackers. Unsalted rice. Unsalted pasta. Low-sodium bread. Whole-grain breads and whole-grain pasta. Meats and other proteins Fresh or frozen (no salt added) meat, poultry, seafood, and fish. Low-sodium canned tuna and salmon. Unsalted nuts. Dried peas, beans, and lentils without added salt. Unsalted canned beans. Eggs. Unsalted nut butters. Dairy Milk. Soy milk. Cheese that is naturally low in sodium, such as ricotta cheese, fresh mozzarella, or Swiss cheese. Low-sodium or reduced-sodium cheese. Cream cheese. Yogurt. Seasonings and condiments Fresh and dried herbs and spices. Salt-free seasonings. Low-sodium mustard and ketchup. Sodium-free salad dressing. Sodium-free light mayonnaise. Fresh or refrigerated horseradish. Lemon juice. Vinegar. Other foods Homemade, reduced-sodium, or low-sodium soups. Unsalted popcorn and pretzels. Low-salt or salt-free chips. The items listed above may not be a complete list of foods and beverages you can eat. Contact a dietitian for more information. What foods should  I avoid? Vegetables Sauerkraut, pickled vegetables, and relishes. Olives. [place] fries. Onion rings. Regular canned vegetables (not low-sodium or reduced-sodium). Regular canned tomato sauce and paste (not low-sodium or  reduced-sodium). Regular tomato and vegetable juice (not low-sodium or reduced-sodium). Frozen vegetables in sauces. Grains Instant hot cereals. Bread stuffing, pancake, and biscuit mixes. Croutons. Seasoned rice or pasta mixes. Noodle soup cups. Boxed or frozen macaroni and cheese. Regular salted crackers. Self-rising flour. Meats and other proteins Meat or fish that is salted, canned, smoked, spiced, or pickled. Precooked or cured meat, such as sausages or meat loaves. [name]. Ham. Pepperoni. Hot dogs. Corned beef. Chipped beef. Salt pork. Jerky. Pickled herring. Anchovies and sardines. Regular canned tuna. Salted nuts. Dairy Processed cheese and cheese spreads. Hard cheeses. Cheese curds. Blue cheese. Feta cheese. String cheese. Regular cottage cheese. Buttermilk. Canned milk. Fats and oils Salted butter. Regular margarine. Ghee. Bacon fat. Seasonings and condiments Onion salt, garlic salt, seasoned salt, table salt, and sea salt. Canned and packaged gravies. Worcestershire sauce. Tartar sauce. Barbecue sauce. Teriyaki sauce. Soy sauce, including reduced-sodium. Steak sauce. Fish sauce. Oyster sauce. Cocktail sauce. Horseradish that you find on the shelf. Regular ketchup and mustard. Meat flavorings and tenderizers. Bouillon cubes. Hot sauce. Pre-made or packaged marinades. Pre-made or packaged taco seasonings. Relishes. Regular salad dressings. Salsa. Other foods Salted popcorn and pretzels. Corn chips and puffs. Potato and tortilla chips. Canned or dried soups. Pizza. Frozen entrees and pot pies. The items listed above may not be a complete list of foods and beverages you should avoid. Contact a dietitian for more information. Summary Eating less sodium can help lower your blood pressure, reduce swelling, and protect your heart, liver, and kidneys. Most people on this plan should limit their sodium intake to 1,500-2,000 mg (milligrams) of sodium each day. Canned, boxed, and frozen foods are high  in sodium. Restaurant foods, fast foods, and pizza are also very high in sodium. You also get sodium by adding salt to food. Try to cook at home, eat more fresh fruits and vegetables, and eat less fast food and canned, processed, or prepared foods. This information is not intended to replace advice given to you by your health care provider. Make sure you discuss any questions you have with your health care provider. Document Revised: 09/27/2019 Document Reviewed: 07/24/2019 Elsevier Patient Education  [place]         Signed, [place], [place]  01/28/2022 11:48 PM    [facility]

## 2022-01-28 ENCOUNTER — Encounter: Payer: Self-pay | Admitting: Physician Assistant

## 2022-02-08 ENCOUNTER — Encounter: Payer: Self-pay | Admitting: Family Medicine

## 2022-02-08 ENCOUNTER — Ambulatory Visit (INDEPENDENT_AMBULATORY_CARE_PROVIDER_SITE_OTHER): Payer: Medicare Other | Admitting: Family Medicine

## 2022-02-08 VITALS — BP 130/78 | HR 68 | Temp 98.0°F | Resp 15 | Ht 60.0 in | Wt 110.0 lb

## 2022-02-08 DIAGNOSIS — F4322 Adjustment disorder with anxiety: Secondary | ICD-10-CM

## 2022-02-08 DIAGNOSIS — L989 Disorder of the skin and subcutaneous tissue, unspecified: Secondary | ICD-10-CM | POA: Diagnosis not present

## 2022-02-08 NOTE — Progress Notes (Signed)
Subjective:  Patient ID: Gina Pace, female    DOB: [birth year]  Age: 88 y.o. MRN: [id number]  CC:  Chief Complaint  Patient presents with   Mass    Pt here for a small lump possible skin tag, noted at the hairdresser about 5 weeks ago, not painful     HPI Gina Pace presents for   Skin lump/lesion: Noted on scalp by her hairdresser about 4 weeks ago. Has had bumps in past, no treatment needed for those - disappeared. . This one is sore/throbs at times. Not all the time. More sore after sleeping on area.  No discharge or bleeding.  No treatment.  No recent dermatology appt.   Anxiety: Sometimes feels anxious, worried about health at times. Some friends have passed away recently. This has been on her mind.  Has been feeling this past few weeks only.     10/25/2021    1:39 PM 07/22/2021   11:33 AM 02/25/2021   11:29 AM 11/25/2020   10:41 AM 09/11/2020   12:01 PM  Depression screen PHQ 2/9  Decreased Interest 0 0 0 0 0  Down, Depressed, Hopeless 0 0 0 0 0  PHQ - 2 Score 0 0 0 0 0  Altered sleeping 0  0    Tired, decreased energy 0  0    Change in appetite 0  0    Feeling bad or failure about yourself  0  0    Trouble concentrating 0  0    Moving slowly or fidgety/restless 0  0    Suicidal thoughts 0  0    PHQ-9 Score 0  0    Difficult doing work/chores Not difficult at all         History Patient Active Problem List   Diagnosis Date Noted   Hypercalcemia 04/19/2021   Closed fracture of proximal end of left humerus 02/19/2020   Pain in joint of left shoulder 01/22/2020   Essential hypertension 02/14/2019   Dizziness and giddiness 02/14/2019   Past Medical History:  Diagnosis Date   Allergy    Cataract    GI bleed    Hypertension 02/2019   Newly diagnosed    Osteoporosis    Tuberculosis    Ulcer of gastric fundus, acute    Past Surgical History:  Procedure Laterality Date   ABDOMINAL HYSTERECTOMY     APPENDECTOMY     Bladder     Bladder  tack   EYE SURGERY     Rectum     Rectal tear repair   Allergies  Allergen Reactions   Chocolate Anaphylaxis    Migraines    Prednisone Hypertension   Shellfish Allergy    Chocolate Flavor    Clindamycin    Clindamycin/Lincomycin    Codeine    Erythromycin    Erythromycin Base    Keflex [Cephalexin]    Lisinopril     Angioedema    Other    Penicillins    Sulfa Antibiotics    Prior to Admission medications   Medication Sig Start Date End Date Taking? Authorizing Provider  Acetaminophen 325 MG CAPS Tylenol   Yes [provider]  amLODipine (NORVASC) 5 MG tablet Take 1 tablet (5 mg total) by mouth in the morning AND 0.5 tablets (2.5 mg total) every evening. 01/26/22  Yes Almyra Deforest, PA  fexofenadine (ALLEGRA) 60 MG tablet Take by mouth.   Yes [provider]  ipratropium (ATROVENT) 0.06 % nasal spray Place  1 spray into both nostrils 4 (four) times daily. As needed for nasal congestion Patient not taking: Reported on 11/01/2021 02/25/21   Wendie Agreste, MD   Social History   Socioeconomic History   Marital status: Widowed    Spouse name: Not on file   Number of children: 3   Years of education: Not on file   Highest education level: Not on file  Occupational History   Occupation: Book Therapist, nutritional    Comment: Retired    Occupation: Higher education careers adviser     Comment: Retired   Tobacco Use   Smoking status: Never   Smokeless tobacco: Never  Scientific laboratory technician Use: Never used  Substance and Sexual Activity   Alcohol use: Yes    Comment: 1 Glass of Scotch a Night   Drug use: Never   Sexual activity: Not Currently  Other Topics Concern   Not on file  Social History Narrative   Has 3 children 2 daughters and a son, who are very attentive.    Lives alone in a townhouse.    Able to perform all ADL's   Exercises daily   Belongs to social clubs   Does line dancing.    Social Determinants of Health   Financial Resource Strain: Low Risk    Difficulty of  Paying Living Expenses: Not hard at all  Food Insecurity: No Food Insecurity   Worried About Charity fundraiser in the Last Year: Never true   [place] in the Last Year: Never true  Transportation Needs: No Transportation Needs   Lack of Transportation (Medical): No   Lack of Transportation (Non-Medical): No  Physical Activity: Inactive   Days of Exercise per Week: 0 days   Minutes of Exercise per Session: 0 min  Stress: No Stress Concern Present   Feeling of Stress : Not at all  Social Connections: Moderately Integrated   Frequency of Communication with Friends and Family: Twice a week   Frequency of Social Gatherings with Friends and Family: Twice a week   Attends Religious Services: More than 4 times per year   Active Member of [employer] or Organizations: Yes   Attends Archivist Meetings: 1 to 4 times per year   Marital Status: Widowed  Human resources officer Violence: Not At Risk   Fear of Current or Ex-Partner: No   Emotionally Abused: No   Physically Abused: No   Sexually Abused: No    Review of Systems  Per HPI.  Objective:   Vitals:   02/08/22 1122  BP: 130/78  Pulse: 68  Resp: 15  Temp: 98 F (36.7 C)  TempSrc: Temporal  SpO2: 98%  Weight: 110 lb (49.9 kg)  Height: 5' (1.524 m)     Physical Exam Constitutional:      General: She is not in acute distress.    Appearance: Normal appearance. She is well-developed.  HENT:     Head: Normocephalic and atraumatic.  Cardiovascular:     Rate and Rhythm: Normal rate.  Pulmonary:     Effort: Pulmonary effort is normal.  Neurological:     Mental Status: She is alert and oriented to person, place, and time.  Psychiatric:        Mood and Affect: Mood normal.        Behavior: Behavior normal.        Thought Content: Thought content normal.       Assessment & Plan:  Gina Pace is a  88 y.o. female . Lesion of skin of scalp - Plan: Ambulatory referral to Dermatology  -New problem.  Question  sebaceous cyst versus other cystic structure.  Minimal discomfort, no signs of infection or bleeding.  Avoid direct pressure to area, refer to dermatology with RTC precautions if acute changes.  Adjustment disorder with anxiety  -New concern/problem.  Likely adjustment disorder with anxiety symptoms in response to death of friends, concern about medical problems.  Handout given on adjustment disorder, hold on new medications for now, has follow-up with me in the next few weeks and can discuss further at that time  No orders of the defined types were placed in this encounter.  Patient Instructions  I will refer you to dermatology for area on scalp. Does not look concerning. Recent stressors may be adjustment disorder - see info below, but if those symptoms continue we can discuss further at your upcoming appointment. Return to the clinic or go to the nearest emergency room if any of your symptoms worsen or new symptoms occur.  Adjustment Disorder, Adult Adjustment disorder is a group of symptoms that can develop after a stressful life event, such as the loss of a job or a serious physical illness. The symptoms can affect how you feel, think, and act. They may also interfere with your relationships. Adjustment disorder increases your risk of suicide and substance abuse. If adjustment disorder is not managed early, it can make medical conditions that you already have worse. If the stressful life event persists, the disorder may continue and become a persistent form of adjustment disorder. What are the causes? This condition is caused by difficulty recovering from or coping with a stressful life event. What increases the risk? You are more likely to develop this condition if: You have had previous problems coping with life stressors. You are being treated for a long-term (chronic) illness. You are being treated for an illness that cannot be cured (terminal illness). You have a family history of  mental illness. What are the signs or symptoms? Symptoms of this condition include: Behavioral symptoms such as: Trouble doing daily tasks. Reckless driving. Poor work Systems analyst. Ignoring bills. Avoiding family and friends. Impulsive actions. Emotional symptoms such as: Sadness, depression, or crying spells. Worrying a lot, or feeling nervous or anxious. Loss of enjoyment. Feelings of loss or hopelessness. Irritability. Thoughts of suicide. Physical symptoms such as: Change in appetite or weight. Complaining of feeling sick without being ill. Feeling dazed or disconnected. Nightmares. Trouble sleeping. Symptoms of this condition start within 3 months of the stressful event. They do not last more than 6 months, unless the stressful circumstances last longer. Normal grieving after the death of a loved one is not a symptom of this condition. How is this diagnosed? To diagnose this condition, your health care provider will ask about what has happened in your life and how it has affected you. He or she may also ask about your medical history and your use of medicines, alcohol, and other substances. Your health care provider may do a physical exam and order lab tests or other studies. You may be referred to a mental health specialist. How is this treated? Treatment options for this condition include: Counseling or talk therapy. Talk therapy is usually provided by mental health specialists. This therapy may be individual or may involve family members. Medicines. Certain medicines may help with depression, anxiety, and sleep. Support groups. These offer emotional support, advice, and guidance. They are made up of people who have  had similar experiences. Observation and time. This is sometimes called watchful waiting. In this treatment, health care providers monitor your health and behavior without other treatment. Adjustment disorder sometimes gets better on its own with time. Follow these  instructions at home: Take over-the-counter and prescription medicines only as told by your health care provider. Keep all follow-up visits. This is important. Contact trusted family and friends for support. Let them know what is going on with you and how they can help. Contact a health care provider if: Your symptoms do not improve in 6 months. Your symptoms get worse. Get help right away if: You have serious thoughts about hurting yourself or someone else. If you ever feel like you may hurt yourself or others, or have thoughts about taking your own life, get help right away. Go to your nearest emergency department or: Call your local emergency services (911 in the [place] Call a suicide crisis helpline, such as the [facility] at 402 052 9079 or 988 in the [place]. This is open 24 hours a day in the [place] Text the Crisis Text Line at 229-057-3996 (in the [place] Summary Adjustment disorder is a group of symptoms that can develop after a stressful life event, such as the loss of a job or a serious physical illness. The symptoms can affect how you feel, think, and act. They may interfere with your relationships. Symptoms of this condition start within 3 months of the stressful event. They do not last more than 6 months, unless the stressful circumstances last longer. Treatment may include talk therapy, medicines, participation in a support group, or observation to see if symptoms improve. Contact your health care provider if your symptoms get worse or do not improve in 6 months. If you ever feel like you may hurt yourself or others, or have thoughts about taking your own life, get help right away. This information is not intended to replace advice given to you by your health care provider. Make sure you discuss any questions you have with your health care provider. Document Revised: 03/17/2021 Document Reviewed: 01/03/2020 Elsevier Patient Education  [place],   Merri Ray, MD [place], [facility] 02/08/22 11:56 AM

## 2022-02-08 NOTE — Patient Instructions (Signed)
I will refer you to dermatology for area on scalp. Does not look concerning. Recent stressors may be adjustment disorder - see info below, but if those symptoms continue we can discuss further at your upcoming appointment. Return to the clinic or go to the nearest emergency room if any of your symptoms worsen or new symptoms occur.  Adjustment Disorder, Adult Adjustment disorder is a group of symptoms that can develop after a stressful life event, such as the loss of a job or a serious physical illness. The symptoms can affect how you feel, think, and act. They may also interfere with your relationships. Adjustment disorder increases your risk of suicide and substance abuse. If adjustment disorder is not managed early, it can make medical conditions that you already have worse. If the stressful life event persists, the disorder may continue and become a persistent form of adjustment disorder. What are the causes? This condition is caused by difficulty recovering from or coping with a stressful life event. What increases the risk? You are more likely to develop this condition if: You have had previous problems coping with life stressors. You are being treated for a long-term (chronic) illness. You are being treated for an illness that cannot be cured (terminal illness). You have a family history of mental illness. What are the signs or symptoms? Symptoms of this condition include: Behavioral symptoms such as: Trouble doing daily tasks. Reckless driving. Poor work Systems analyst. Ignoring bills. Avoiding family and friends. Impulsive actions. Emotional symptoms such as: Sadness, depression, or crying spells. Worrying a lot, or feeling nervous or anxious. Loss of enjoyment. Feelings of loss or hopelessness. Irritability. Thoughts of suicide. Physical symptoms such as: Change in appetite or weight. Complaining of feeling sick without being ill. Feeling dazed or  disconnected. Nightmares. Trouble sleeping. Symptoms of this condition start within 3 months of the stressful event. They do not last more than 6 months, unless the stressful circumstances last longer. Normal grieving after the death of a loved one is not a symptom of this condition. How is this diagnosed? To diagnose this condition, your health care provider will ask about what has happened in your life and how it has affected you. He or she may also ask about your medical history and your use of medicines, alcohol, and other substances. Your health care provider may do a physical exam and order lab tests or other studies. You may be referred to a mental health specialist. How is this treated? Treatment options for this condition include: Counseling or talk therapy. Talk therapy is usually provided by mental health specialists. This therapy may be individual or may involve family members. Medicines. Certain medicines may help with depression, anxiety, and sleep. Support groups. These offer emotional support, advice, and guidance. They are made up of people who have had similar experiences. Observation and time. This is sometimes called watchful waiting. In this treatment, health care providers monitor your health and behavior without other treatment. Adjustment disorder sometimes gets better on its own with time. Follow these instructions at home: Take over-the-counter and prescription medicines only as told by your health care provider. Keep all follow-up visits. This is important. Contact trusted family and friends for support. Let them know what is going on with you and how they can help. Contact a health care provider if: Your symptoms do not improve in 6 months. Your symptoms get worse. Get help right away if: You have serious thoughts about hurting yourself or someone else. If you ever feel like  you may hurt yourself or others, or have thoughts about taking your own life, get help right  away. Go to your nearest emergency department or: Call your local emergency services (911 in the [place] Call a suicide crisis helpline, such as the [facility] at 819-217-3775 or 988 in the [place]. This is open 24 hours a day in the [place] Text the Crisis Text Line at (786)592-0618 (in the [place] Summary Adjustment disorder is a group of symptoms that can develop after a stressful life event, such as the loss of a job or a serious physical illness. The symptoms can affect how you feel, think, and act. They may interfere with your relationships. Symptoms of this condition start within 3 months of the stressful event. They do not last more than 6 months, unless the stressful circumstances last longer. Treatment may include talk therapy, medicines, participation in a support group, or observation to see if symptoms improve. Contact your health care provider if your symptoms get worse or do not improve in 6 months. If you ever feel like you may hurt yourself or others, or have thoughts about taking your own life, get help right away. This information is not intended to replace advice given to you by your health care provider. Make sure you discuss any questions you have with your health care provider. Document Revised: 03/17/2021 Document Reviewed: 01/03/2020 Elsevier Patient Education  [place].

## 2022-02-28 ENCOUNTER — Ambulatory Visit: Payer: Medicare Other | Admitting: Neurology

## 2022-02-28 ENCOUNTER — Encounter: Payer: Self-pay | Admitting: Family Medicine

## 2022-02-28 ENCOUNTER — Ambulatory Visit (INDEPENDENT_AMBULATORY_CARE_PROVIDER_SITE_OTHER): Payer: Medicare Other | Admitting: Family Medicine

## 2022-02-28 VITALS — BP 138/74 | HR 99 | Temp 98.2°F | Resp 16 | Ht 60.0 in | Wt 109.2 lb

## 2022-02-28 DIAGNOSIS — F4322 Adjustment disorder with anxiety: Secondary | ICD-10-CM | POA: Diagnosis not present

## 2022-02-28 DIAGNOSIS — R220 Localized swelling, mass and lump, head: Secondary | ICD-10-CM | POA: Diagnosis not present

## 2022-02-28 DIAGNOSIS — L989 Disorder of the skin and subcutaneous tissue, unspecified: Secondary | ICD-10-CM

## 2022-04-25 ENCOUNTER — Ambulatory Visit: Payer: Medicare Other | Admitting: Physician Assistant

## 2022-04-25 ENCOUNTER — Encounter: Payer: Self-pay | Admitting: Physician Assistant

## 2022-04-25 VITALS — BP 180/80 | HR 75 | Ht 60.0 in | Wt 108.6 lb

## 2022-04-25 DIAGNOSIS — I10 Essential (primary) hypertension: Secondary | ICD-10-CM

## 2022-04-25 DIAGNOSIS — I493 Ventricular premature depolarization: Secondary | ICD-10-CM

## 2022-04-25 NOTE — Patient Instructions (Signed)
Medication Instructions:  TAKE Amlodipine 5 mg in the mornings and 2.5 mg in the evenings.  *If you need a refill on your cardiac medications before your next appointment, please call your pharmacy*  Lab Work: NONE ordered at this time of appointment   If you have labs (blood work) drawn today and your tests are completely normal, you will receive your results only by: [place] (if you have MyChart) OR A paper copy in the mail If you have any lab test that is abnormal or we need to change your treatment, we will call you to review the results.  Testing/Procedures: NONE ordered at this time of appointment   Follow-Up: At [facility], you and your health needs are our priority.  As part of our continuing mission to provide you with exceptional heart care, we have created designated Provider Care Teams.  These Care Teams include your primary Cardiologist (physician) and Advanced Practice Providers (APPs -  Physician Assistants and Nurse Practitioners) who all work together to provide you with the care you need, when you need it.   Your next appointment:   6 month(s)  The format for your next appointment:   In Person  Provider:   Kirk Ruths, MD    Other Instructions   Important Information About Sugar

## 2022-04-25 NOTE — Progress Notes (Unsigned)
Cardiology Office Note:    Date:  04/27/2022   ID:  Gina Pace, DOB 07/22/1925, MRN [id number]  PCP:  Wendie Agreste, MD   [facility] Cardiologist:  Kirk Ruths, MD     Referring MD: Wendie Agreste, MD   Chief Complaint  Patient presents with   Follow-up    Seen for Dr. Stanford Breed    History of Present Illness:    Gina Pace is a 88 y.o. female with a hx of hypertension, osteoporosis, and history of TB.  She also has a history of angioedema on lisinopril.  Chest CT in June 2020 showed a dilated pulmonary artery and cardiomegaly.  Echocardiogram obtained in July 2020 showed normal LV function, mild LVH, mild diastolic dysfunction, moderate LAE, moderate MR, moderate AI and moderate pulmonary insufficiency.  Lower extremity venous Doppler in September 2021 showed no DVT.  Patient was last seen by Dr. Stanford Breed in October 2021 at which time she was doing well.   More recently, patient was seen in the ED on 10/24/2021, renal function and electrolyte okay.  Urine culture positive for E. coli UTI.  CT of the head showed no intracranial abnormality.  Chest x-ray showed no acute abnormality either she was seen by her PCP in November 25, 2021.  I last saw the patient on 01/26/2022 at which time her blood pressure was elevated.  I asked her to resume on the previous higher dose of amlodipine at 5 mg a.m. and 2.5 mg p.m.  EKG at the time showed ventricular bigeminy however patient has no cardiac awareness of PVC.  He drank a lot of coffee and tea on a daily basis.  I asked her to cut back.  Patient presents today for reassessment.  EKG shows she is no longer in ventricular bigeminy, she is still had a few PVCs on single strip of the EKG, however this is clearly decreased from the previous visit.  She says she has cut back on the caffeine which helped.  She does have elevated blood pressure today, she has sinus drainage and allergy issues.  This likely explains  her elevated blood pressure.  Her blood pressure has been borderline elevated when she see Dr. Nyoka Cowden, she has been taking 2.5 mg twice a day of amlodipine, she may start taking 5 mg a.m. and 2.5 mg p.m. if needed to help control the blood pressure better.  She has multiple allergies.  Previously was evaluated by PCP regarding swelling of the tongue when she wakes up in the morning.  I am not confident this is related to her medications.   Past Medical History:  Diagnosis Date   Allergy    Cataract    GI bleed    Hypertension 02/2019   Newly diagnosed    Osteoporosis    Tuberculosis    Ulcer of gastric fundus, acute     Past Surgical History:  Procedure Laterality Date   ABDOMINAL HYSTERECTOMY     APPENDECTOMY     Bladder     Bladder tack   EYE SURGERY     Rectum     Rectal tear repair    Current Medications: Current Meds  Medication Sig   Acetaminophen 325 MG CAPS Take 325 mg by mouth daily as needed.   fexofenadine (ALLEGRA) 60 MG tablet Take 60 mg by mouth daily. Take 1/2 tablet twice daily   [DISCONTINUED] amLODipine (NORVASC) 2.5 MG tablet Take 1 tablet by mouth 2 (two) times daily.  Allergies:   Chocolate, Prednisone, Shellfish allergy, Chocolate flavor, Clindamycin, Clindamycin/lincomycin, Codeine, Erythromycin, Erythromycin base, Keflex [cephalexin], Lisinopril, Other, Penicillins, and Sulfa antibiotics   Social History   Socioeconomic History   Marital status: Widowed    Spouse name: Not on file   Number of children: 3   Years of education: Not on file   Highest education level: Not on file  Occupational History   Occupation: Book Therapist, nutritional    Comment: Retired    Occupation: Higher education careers adviser     Comment: Retired   Tobacco Use   Smoking status: Never   Smokeless tobacco: Never  Scientific laboratory technician Use: Never used  Substance and Sexual Activity   Alcohol use: Yes    Comment: 1 Glass of Scotch a Night   Drug use: Never   Sexual activity: Not Currently   Other Topics Concern   Not on file  Social History Narrative   Has 3 children 2 daughters and a son, who are very attentive.    Lives alone in a townhouse.    Able to perform all ADL's   Exercises daily   Belongs to social clubs   Does line dancing.    Social Determinants of Health   Financial Resource Strain: Low Risk  (07/22/2021)   Overall Financial Resource Strain (CARDIA)    Difficulty of Paying Living Expenses: Not hard at all  Food Insecurity: No Food Insecurity (07/22/2021)   Hunger Vital Sign    Worried About Running Out of Food in the Last Year: Never true    Ran Out of Food in the Last Year: Never true  Transportation Needs: No Transportation Needs (07/22/2021)   PRAPARE - Hydrologist (Medical): No    Lack of Transportation (Non-Medical): No  Physical Activity: Inactive (07/22/2021)   Exercise Vital Sign    Days of Exercise per Week: 0 days    Minutes of Exercise per Session: 0 min  Stress: No Stress Concern Present (07/22/2021)   [place]    Feeling of Stress : Not at all  Social Connections: Moderately Integrated (07/22/2021)   Social Connection and Isolation Panel [NHANES]    Frequency of Communication with Friends and Family: Twice a week    Frequency of Social Gatherings with Friends and Family: Twice a week    Attends Religious Services: More than 4 times per year    Active Member of [employer] or Organizations: Yes    Attends Archivist Meetings: 1 to 4 times per year    Marital Status: Widowed     Family History: The patient's family history includes CAD in her sister; CAD (age of onset: 34) in her father; CAD (age of onset: 73) in her mother; Congestive Heart Failure in her sister; Emphysema in her brother; Heart attack in her father and mother; Hypercalcemia in her sister.  ROS:   Please see the history of present illness.     All other systems  reviewed and are negative.  EKGs/Labs/Other Studies Reviewed:    The following studies were reviewed today:  Echo 08/20/2020  1. Left ventricular ejection fraction, by estimation, is 55 to 60%. The  left ventricle has normal function. The left ventricle has no regional  wall motion abnormalities. Left ventricular diastolic parameters are  indeterminate.   2. Right ventricular systolic function is normal. The right ventricular  size is normal. There is moderately elevated pulmonary artery systolic  pressure.  3. Left atrial size was severely dilated.   4. The mitral valve is grossly normal. Moderate mitral valve  regurgitation.   5. Tricuspid valve regurgitation is moderate.   6. The aortic valve is tricuspid. There is mild calcification of the  aortic valve. Aortic valve regurgitation is moderate.   7. The inferior vena cava is dilated in size with <50% respiratory  variability, suggesting right atrial pressure of 15 mmHg.   Comparison(s): A prior study was performed on 03/20/2019. Increase in left  atrial size and tricuspid regurgitation; stable aortic regurgitation and  mitral regurgitation.   EKG:  EKG is ordered today.  The ekg ordered today demonstrates sinus rhythm with occasional PVCs.  Recent Labs: 10/24/2021: ALT 9; BUN 20; Creatinine, Ser 0.75; Hemoglobin 12.8; Platelets 191; Potassium 4.0; Sodium 137  Recent Lipid Panel No results found for: "CHOL", "TRIG", "HDL", "CHOLHDL", "VLDL", "LDLCALC", "LDLDIRECT"   Risk Assessment/Calculations:           Physical Exam:    VS:  BP (!) 180/80 (BP Location: Left Arm, Patient Position: Sitting, Cuff Size: Small)   Pulse 75   Ht 5' (1.524 m)   Wt 108 lb 9.6 oz (49.3 kg)   BMI 21.21 kg/m        Wt Readings from Last 3 Encounters:  04/25/22 108 lb 9.6 oz (49.3 kg)  02/28/22 109 lb 3.2 oz (49.5 kg)  02/08/22 110 lb (49.9 kg)     GEN:  Well nourished, well developed in no acute distress HEENT: Normal NECK: No JVD;  No carotid bruits LYMPHATICS: No lymphadenopathy CARDIAC: RRR, no murmurs, rubs, gallops RESPIRATORY:  Clear to auscultation without rales, wheezing or rhonchi  ABDOMEN: Soft, non-tender, non-distended MUSCULOSKELETAL:  No edema; No deformity  SKIN: Warm and dry NEUROLOGIC:  Alert and oriented x 3 PSYCHIATRIC:  Normal affect   ASSESSMENT:    1. Frequent PVCs   2. Essential hypertension    PLAN:    In order of problems listed above:  Frequent PVCs: Frequency of PVC has decreased since the last visit.  She was previously in bigeminy, she still had a few PVCs on single EKG.  We will continue current therapy for now.  Hypertension: Blood pressure remains elevated, will increase amlodipine to 5 mg a.m. and 2.5 mg p.m.           Medication Adjustments/Labs and Tests Ordered: Current medicines are reviewed at length with the patient today.  Concerns regarding medicines are outlined above.  Orders Placed This Encounter  Procedures   EKG 12-Lead   No orders of the defined types were placed in this encounter.   Patient Instructions  Medication Instructions:  TAKE Amlodipine 5 mg in the mornings and 2.5 mg in the evenings.  *If you need a refill on your cardiac medications before your next appointment, please call your pharmacy*  Lab Work: NONE ordered at this time of appointment   If you have labs (blood work) drawn today and your tests are completely normal, you will receive your results only by: [place] (if you have MyChart) OR A paper copy in the mail If you have any lab test that is abnormal or we need to change your treatment, we will call you to review the results.  Testing/Procedures: NONE ordered at this time of appointment   Follow-Up: At [facility], you and your health needs are our priority.  As part of our continuing mission to provide you with exceptional heart care, we have created designated  Provider Care Teams.  These Care Teams include your  primary Cardiologist (physician) and Advanced Practice Providers (APPs -  Physician Assistants and Nurse Practitioners) who all work together to provide you with the care you need, when you need it.   Your next appointment:   6 month(s)  The format for your next appointment:   In Person  Provider:   Kirk Ruths, MD    Other Instructions   Important Information About Sugar         Hilbert Corrigan, [place]  04/27/2022 11:43 PM    [place]

## 2022-04-27 ENCOUNTER — Encounter: Payer: Self-pay | Admitting: Neurology

## 2022-04-27 ENCOUNTER — Encounter: Payer: Self-pay | Admitting: Physician Assistant

## 2022-05-06 ENCOUNTER — Other Ambulatory Visit: Payer: Self-pay | Admitting: Family Medicine

## 2022-05-06 DIAGNOSIS — I10 Essential (primary) hypertension: Secondary | ICD-10-CM

## 2022-05-07 ENCOUNTER — Other Ambulatory Visit: Payer: Self-pay | Admitting: Family Medicine

## 2022-05-07 DIAGNOSIS — I10 Essential (primary) hypertension: Secondary | ICD-10-CM

## 2022-05-10 ENCOUNTER — Encounter: Payer: Self-pay | Admitting: Internal Medicine

## 2022-05-10 ENCOUNTER — Ambulatory Visit: Payer: Medicare Other | Admitting: Internal Medicine

## 2022-05-10 VITALS — BP 120/80 | HR 74 | Ht 60.0 in | Wt 118.0 lb

## 2022-05-10 DIAGNOSIS — E213 Hyperparathyroidism, unspecified: Secondary | ICD-10-CM | POA: Diagnosis not present

## 2022-05-10 DIAGNOSIS — M810 Age-related osteoporosis without current pathological fracture: Secondary | ICD-10-CM

## 2022-05-10 LAB — ALBUMIN: Albumin: 4.3 g/dL (ref 3.5–5.2)

## 2022-05-10 LAB — BASIC METABOLIC PANEL
BUN: 22 mg/dL (ref 6–23)
CO2: 27 mEq/L (ref 19–32)
Calcium: 10.7 mg/dL — ABNORMAL HIGH (ref 8.4–10.5)
Chloride: 103 mEq/L (ref 96–112)
Creatinine, Ser: 0.85 mg/dL (ref 0.40–1.20)
GFR: 58.3 mL/min — ABNORMAL LOW (ref >60.00)
Glucose, Bld: 86 mg/dL (ref 70–99)
Potassium: 4.5 mEq/L (ref 3.5–5.1)
Sodium: 138 mEq/L (ref 135–145)

## 2022-05-10 LAB — VITAMIN D 25 HYDROXY (VIT D DEFICIENCY, FRACTURES): VITD: 33.07 ng/mL (ref 30.00–100.00)

## 2022-05-10 NOTE — Patient Instructions (Signed)
Stay Hydrated  Avoid over the counter calcium tablets  Maintain 2-3 servings of dietary calcium daily  Continue Vitamin D 1000 iu daily

## 2022-05-10 NOTE — Progress Notes (Unsigned)
Name: Gina Pace  MRN/ DOB: [id number], 1925-05-16    Age/ Sex: 88 y.o., female     PCP: Wendie Agreste, MD   Reason for Endocrinology Evaluation: Primary Hyperparathyroidism     Initial Endocrinology Clinic Visit: 03/09/2021    PATIENT IDENTIFIER: Ms. Gina Pace is a 88 y.o., female with a past medical history of osteoporosis . She has followed with [facility] since 03/09/2021 for consultative assistance with management of her Primary hyperparathyroidism.   HISTORICAL SUMMARY: The patient was first noted with hypercalcemia intermittently since June 2020.  Serum calcium max level of 10.6 mg/DL in January 2021 (and corrected).  She has also been noted with elevated PTH of 74 PG/mL in April 2022.  She has a diagnosis of osteoporosis with a T score of -3.5 at the spine on 11/09/2021 She does have a history of humeral fracture in 2021   She was seen by Dr. Loanne Drilling from July 2022 until February 2023.  She had no prior 24-hour urinary collection for calcium excretion at the time   She has no history of nephrolithiasis  SUBJECTIVE:    Today (05/10/2022):  Ms. Hollen is here for primary hyperparathyroidism and osteoporosis.   She is not on calcium tablets  No recent renal stones  She drinks plenty of water but no polydipsia  Has frequency  She has not  been on bisphosphonate in the past  She denies heartburn or acid reflux      Vitamin D3 1000 IU daily  HISTORY:  Past Medical History:  Past Medical History:  Diagnosis Date   Allergy    Cataract    GI bleed    Hypertension 02/2019   Newly diagnosed    Osteoporosis    Tuberculosis    Ulcer of gastric fundus, acute    Past Surgical History:  Past Surgical History:  Procedure Laterality Date   ABDOMINAL HYSTERECTOMY     APPENDECTOMY     Bladder     Bladder tack   EYE SURGERY     Rectum     Rectal tear repair   Social History:  reports that she has never smoked. She has never  used smokeless tobacco. She reports current alcohol use. She reports that she does not use drugs. Family History:  Family History  Problem Relation Age of Onset   CAD Mother 83   Heart attack Mother    CAD Father 76   Heart attack Father    Congestive Heart Failure Sister    CAD Sister        Hx of CABG   Hypercalcemia Sister    Emphysema Brother      HOME MEDICATIONS: Allergies as of 05/10/2022       Reactions   Chocolate Anaphylaxis   Migraines   Prednisone Hypertension   Shellfish Allergy    Chocolate Flavor    Clindamycin    Clindamycin/lincomycin    Codeine    Erythromycin    Erythromycin Base    Keflex [cephalexin]    Lisinopril    Angioedema   Other    Penicillins    Sulfa Antibiotics         Medication List        Accurate as of May 10, 2022  7:33 AM. If you have any questions, ask your nurse or doctor.          Acetaminophen 325 MG Caps Take 325 mg by mouth daily as needed.   amLODipine 5  MG tablet Commonly known as: NORVASC Take 1 tablet (5 mg total) by mouth in the morning AND 0.5 tablets (2.5 mg total) every evening.   fexofenadine 60 MG tablet Commonly known as: ALLEGRA Take 60 mg by mouth daily. Take 1/2 tablet twice daily          OBJECTIVE:   PHYSICAL EXAM: VS: There were no vitals taken for this visit.   EXAM: General: Pt appears well and is in NAD  Eyes: External eye exam normal without stare, lid lag or exophthalmos.  EOM intact.    Neck: General: Supple without adenopathy. Thyroid: Thyroid size normal.  No goiter or nodules appreciated. No thyroid bruit.  Lungs: Clear with good BS bilat with no rales, rhonchi, or wheezes  Heart: Auscultation: RRR.  Abdomen: Normoactive bowel sounds, soft, nontender, without masses or organomegaly palpable  Extremities:  BL LE: No pretibial edema normal ROM and strength.  Mental Status: Judgment, insight: Intact Orientation: Oriented to time, place, and person Mood and affect:  No depression, anxiety, or agitation     DATA REVIEWED: ***    ASSESSMENT / PLAN / RECOMMENDATIONS:   Primary hyperparathyroidism:   - Not a surgical candidate due to advanced age  - Will treat medically when necessary    Recommendations  Stay Hydrated  Avoid over the counter calcium tablets  Maintain 2-3 servings of dietary calcium daily   2. Osteoporosis :  - Pt declines anti-resorptive  therapy  - We discussed the risk of worsening BMD without treatment , pt states she is sensitive to medications and tends to get side effects   - She would like to wait unless it gets worse    Signed electronically by: Mack Guise, MD  [facility]  [address] [place], [place] [address] Phone: [phone] FAX: [phone]      CC: Wendie Agreste, [place] [address] [place] [address] Phone: [phone]  Fax: [phone]   Return to Endocrinology clinic as below: Future Appointments  Date Time Provider [place]  05/10/2022  1:00 PM Brailon Don, Melanie Crazier, MD [facility] None  06/02/2022  1:00 PM Wendie Agreste, MD [facility]  06/03/2022 11:10 AM Pieter Partridge, DO [facility] None

## 2022-05-11 LAB — PARATHYROID HORMONE, INTACT (NO CA): PTH: 93 pg/mL — ABNORMAL HIGH (ref 16–77)

## 2022-06-01 NOTE — Progress Notes (Signed)
NEUROLOGY CONSULTATION NOTE  PENNY ARRAMBIDE MRN: [id number] DOB: 12/28/1924  Referring provider: Merri Ray, MD Primary care provider: Merri Ray, MD  Reason for consult:  weakness  Assessment/Plan:   Weakness - likely related to UT.  No weakness appreciated today Probable bilateral carpal tunnel syndrome   1  Recommend wearing wrist splints at night 2  Follow up as needed.   Subjective:  Gina Pace is a 88 year old female with HTN, who presents for weakness.  History supplemented by primary care and ED notes.  She is accompanied by her daughter who also supplements history.  On 2/19, she woke up feeling generalized weakness.  No headache, chest pain, dyspnea, neck pain, back pain, vision changes, slurred speech or focal weakness.  Went to the ED at [facility] for further evaluation.  CT head personally reviewed showed chronic small vessel ischemic changes but no acute intracranial abnormality.  She was found to have a UTI and treated with antibiotics.  After the ED visit, she reported still feeling a little weak when she would get up to stand.  About a week later, she got up from the chair and suddenly both legs felt weak, causing her to fall to the floor.  No associated pain.  No associated lightheadedness.  Found to have frequent PVCs and bigeminy on EKG.  Followed up with cardiology who advised to reduce caffeine intake.  Feeling well now.  Notes numbness in fingertips since childhood after getting frostbite.  When she is in bed at night, she notes tingling of both hands that resolve after moving her arm or shaking out her wrists.       PAST MEDICAL HISTORY: Past Medical History:  Diagnosis Date   Allergy    Cataract    GI bleed    Hypertension 02/2019   Newly diagnosed    Osteoporosis    Tuberculosis    Ulcer of gastric fundus, acute     PAST SURGICAL HISTORY: Past Surgical History:  Procedure Laterality Date   ABDOMINAL HYSTERECTOMY      APPENDECTOMY     Bladder     Bladder tack   EYE SURGERY     Rectum     Rectal tear repair    MEDICATIONS: Current Outpatient Medications on File Prior to Visit  Medication Sig Dispense Refill   Acetaminophen 325 MG CAPS Take 325 mg by mouth daily as needed.     amLODipine (NORVASC) 5 MG tablet Take 1 tablet (5 mg total) by mouth in the morning AND 0.5 tablets (2.5 mg total) every evening. 135 tablet 0   fexofenadine (ALLEGRA) 60 MG tablet Take 60 mg by mouth daily. Take 1/2 tablet twice daily     No current facility-administered medications on file prior to visit.    ALLERGIES: Allergies  Allergen Reactions   Chocolate Anaphylaxis    Migraines    Prednisone Hypertension   Shellfish Allergy    Chocolate Flavor    Clindamycin    Clindamycin/Lincomycin    Codeine    Erythromycin    Erythromycin Base    Keflex [Cephalexin]    Lisinopril     Angioedema    Other    Penicillins    Sulfa Antibiotics     FAMILY HISTORY: Family History  Problem Relation Age of Onset   CAD Mother 22   Heart attack Mother    CAD Father 32   Heart attack Father    Congestive Heart Failure Sister  CAD Sister        Hx of CABG   Hypercalcemia Sister    Emphysema Brother     Objective:  Blood pressure (!) 165/74, pulse 78, weight 112 lb 9.6 oz (51.1 kg), SpO2 98 %. General: No acute distress.  Patient appears well-groomed.   Head:  Normocephalic/atraumatic Eyes:  fundi examined but not visualized Neck: supple, no paraspinal tenderness, full range of motion Back: No paraspinal tenderness Heart: regular rate and rhythm Neurological Exam: Mental status: alert and oriented to person, place, and time, speech fluent and not dysarthric, language intact. Cranial nerves: CN I: not tested CN II: pupils equal, round and reactive to light, visual fields intact CN III, IV, VI:  full range of motion, no nystagmus, no ptosis CN V: facial sensation intact. CN VII: upper and lower face  symmetric CN VIII: hearing intact CN IX, X: gag intact, uvula midline CN XI: sternocleidomastoid and trapezius muscles intact CN XII: tongue midline Bulk & Tone: normal, no fasciculations. Motor:  muscle strength 5/5 throughout Sensation:  Pinprick, temperature and vibratory sensation intact. Deep Tendon Reflexes:  2+ throughout,  toes downgoing.   Finger to nose testing:  Without dysmetria.   Heel to shin:  Without dysmetria.   Gait:  Normal station and stride.  Romberg negative.    Thank you for allowing me to take part in the care of this patient.  Metta Clines, DO  CC: Merri Ray, MD

## 2022-06-02 ENCOUNTER — Ambulatory Visit (INDEPENDENT_AMBULATORY_CARE_PROVIDER_SITE_OTHER): Payer: Medicare Other | Admitting: Family Medicine

## 2022-06-02 ENCOUNTER — Encounter: Payer: Self-pay | Admitting: Family Medicine

## 2022-06-02 VITALS — BP 146/80 | HR 84 | Temp 98.4°F | Ht 60.0 in | Wt 112.0 lb

## 2022-06-02 DIAGNOSIS — R0981 Nasal congestion: Secondary | ICD-10-CM

## 2022-06-02 DIAGNOSIS — F4322 Adjustment disorder with anxiety: Secondary | ICD-10-CM

## 2022-06-02 DIAGNOSIS — I10 Essential (primary) hypertension: Secondary | ICD-10-CM

## 2022-06-02 DIAGNOSIS — I493 Ventricular premature depolarization: Secondary | ICD-10-CM | POA: Diagnosis not present

## 2022-06-02 DIAGNOSIS — R220 Localized swelling, mass and lump, head: Secondary | ICD-10-CM

## 2022-06-02 MED ORDER — IPRATROPIUM BROMIDE 0.06 % NA SOLN: 1.0000 | Freq: Two times a day (BID) | NASAL | 5 refills | Status: AC

## 2022-06-02 MED ORDER — AMLODIPINE BESYLATE 2.5 MG PO TABS: 2.5000 mg | ORAL_TABLET | Freq: Three times a day (TID) | ORAL | 1 refills | Status: DC

## 2022-06-02 NOTE — Patient Instructions (Addendum)
Ok to increase the saline nasal spray to a few times per day or saline wash - just make sure the device is cleaned frequently and clean water if you use one of those devices. I think you should try the atrovent nasal spray as recommended by ENT last year. If new side effects or problems with that med we can discuss other options. Follow up if that occurs.   Okay to continue amlodipine 2.5 mg 3 times per day.  Please call your dentist for appointment to discuss the tongue swelling and symptoms.  I will also refer you to allergist.  Stay on Allegra daily.  If any worsening symptoms be seen right away through urgent care or emergency room.

## 2022-06-02 NOTE — Progress Notes (Signed)
Subjective:  Patient ID: Gina Pace, female    DOB: 1924-10-17  Age: 88 y.o. MRN: [id number]  CC:  Chief Complaint  Patient presents with   Anxiety    Doing okay   Nasal Congestion    Pt notes that she wakes up in the morning with congestion and cant breath through her nose until it clears     HPI Stacy Sailer Araque presents for   Adjustment disorder Discussed in 05-Mar-2023.  Thought to be due to death of friends and concern about her medical history.  Overall stable, improved at June 26 visit, denied needs at that time.  No new meds for mood symptoms at that time. Feels better now.     02/28/2022    1:13 PM 10/25/2021    1:39 PM 07/22/2021   11:33 AM 02/25/2021   11:29 AM 11/25/2020   10:41 AM  Depression screen PHQ 2/9  Decreased Interest 0 0 0 0 0  Down, Depressed, Hopeless 0 0 0 0 0  PHQ - 2 Score 0 0 0 0 0  Altered sleeping 0 0  0   Tired, decreased energy 0 0  0   Change in appetite 0 0  0   Feeling bad or failure about yourself  0 0  0   Trouble concentrating 0 0  0   Moving slowly or fidgety/restless 0 0  0   Suicidal thoughts 0 0  0   PHQ-9 Score 0 0  0   Difficult doing work/chores  Not difficult at all       Hypertension: Stable at recent visit with endocrinology, history of hyperparathyroidism, osteoporosis.  Note reviewed from September 5. Primary hyperparathyroidism, not surgical candidate due to advanced age, treat medically when necessary.  Continued hydration discussed, avoid over-the-counter calcium tablets but continue dietary calcium 2-3 servings per day.  Declined antiresorptive therapy for osteoporosis at endocrinology visit.  Cardiology appointment noted August 21.  Hypertension with frequent PVCs.  Prior ventricular bigeminy without awareness of PVCs.  No longer in bigeminy at her August 21 visit.  Improved with decrease caffeine. Amlodipine 2.5 mg twice daily for hypertension previously.  Option of 5 mg in the morning per cardiology for improved  control, continued 2.5 mg evening.  Recent labs noted. Reports headache with '5mg'$  in am - feels ok if taking 2.'5mg'$  TID.  Home readings:none. BP Readings from Last 3 Encounters:  06/02/22 (!) 146/80  05/10/22 120/80  04/25/22 (!) 180/80   Lab Results  Component Value Date   CREATININE 0.85 05/10/2022   Nasal congestion History of allergic rhinitis, has taken Allegra previously.  Atrovent nasal spray was prescribed in 2021-03-04.  Previously Flonase had caused headaches and dizziness.  Has noticed more congestion in the morning. Longstanding issue. No recent changes. Possible more congestion after 2nd covid vaccine.  Takes 1/2 allegra in the am that seems to help, other half at night.  Able to cough up some of the phlegm and PND. Vicks topical on forehead helps some.  Ayr nasal spray every other day. Better during the day.  No fever. Some sinus congestion. No discolored nasal discharge.  ENT eval in 04/2021. Recommended atrovent nasal spray BID. Not sure if she had any side effects.  No decongestant nasal sprays.  Feels like swells in the morning, improves after taking allegra, affects talking temporarily. More space if removing dentures. Has not discussed with dentist. Plans to call and schedule.   History Patient Active Problem List  Diagnosis Date Noted   Hypercalcemia 04/19/2021   Closed fracture of proximal end of left humerus 02/19/2020   Pain in joint of left shoulder 01/22/2020   Essential hypertension 02/14/2019   Dizziness and giddiness 02/14/2019   Past Medical History:  Diagnosis Date   Allergy    Cataract    GI bleed    Hypertension 02/2019   Newly diagnosed    Osteoporosis    Tuberculosis    Ulcer of gastric fundus, acute    Past Surgical History:  Procedure Laterality Date   ABDOMINAL HYSTERECTOMY     APPENDECTOMY     Bladder     Bladder tack   EYE SURGERY     Rectum     Rectal tear repair   Allergies  Allergen Reactions   Chocolate Anaphylaxis     Migraines    Prednisone Hypertension   Shellfish Allergy    Chocolate Flavor    Clindamycin    Clindamycin/Lincomycin    Codeine    Erythromycin    Erythromycin Base    Keflex [Cephalexin]    Lisinopril     Angioedema    Other    Penicillins    Sulfa Antibiotics    Prior to Admission medications   Medication Sig Start Date End Date Taking? Authorizing Provider  Acetaminophen 325 MG CAPS Take 325 mg by mouth daily as needed.    [provider]  amLODipine (NORVASC) 5 MG tablet Take 1 tablet (5 mg total) by mouth in the morning AND 0.5 tablets (2.5 mg total) every evening. 01/26/22   Almyra Deforest, PA  fexofenadine (ALLEGRA) 60 MG tablet Take 60 mg by mouth daily. Take 1/2 tablet twice daily    [provider]   Social History   Socioeconomic History   Marital status: Widowed    Spouse name: Not on file   Number of children: 3   Years of education: Not on file   Highest education level: Not on file  Occupational History   Occupation: Book Therapist, nutritional    Comment: Retired    Occupation: Higher education careers adviser     Comment: Retired   Tobacco Use   Smoking status: Never   Smokeless tobacco: Never  Scientific laboratory technician Use: Never used  Substance and Sexual Activity   Alcohol use: Yes    Comment: 1 Glass of Scotch a Night   Drug use: Never   Sexual activity: Not Currently  Other Topics Concern   Not on file  Social History Narrative   Has 3 children 2 daughters and a son, who are very attentive.    Lives alone in a townhouse.    Able to perform all ADL's   Exercises daily   Belongs to social clubs   Does line dancing.    Social Determinants of Health   Financial Resource Strain: Low Risk  (07/22/2021)   Overall Financial Resource Strain (CARDIA)    Difficulty of Paying Living Expenses: Not hard at all  Food Insecurity: No Food Insecurity (07/22/2021)   Hunger Vital Sign    Worried About Running Out of Food in the Last Year: Never true    Ran Out of Food in  the Last Year: Never true  Transportation Needs: No Transportation Needs (07/22/2021)   PRAPARE - Hydrologist (Medical): No    Lack of Transportation (Non-Medical): No  Physical Activity: Inactive (07/22/2021)   Exercise Vital Sign    Days of Exercise per Week: 0  days    Minutes of Exercise per Session: 0 min  Stress: No Stress Concern Present (07/22/2021)   [place]    Feeling of Stress : Not at all  Social Connections: Moderately Integrated (07/22/2021)   Social Connection and Isolation Panel [NHANES]    Frequency of Communication with Friends and Family: Twice a week    Frequency of Social Gatherings with Friends and Family: Twice a week    Attends Religious Services: More than 4 times per year    Active Member of [employer] or Organizations: Yes    Attends Archivist Meetings: 1 to 4 times per year    Marital Status: Widowed  Intimate Partner Violence: Not At Risk (07/22/2021)   Humiliation, Afraid, Rape, and Kick questionnaire    Fear of Current or Ex-Partner: No    Emotionally Abused: No    Physically Abused: No    Sexually Abused: No    Review of Systems   Objective:   Vitals:   06/02/22 1302  BP: (!) 146/80  Pulse: 84  Temp: 98.4 F (36.9 C)  TempSrc: Oral  SpO2: 96%  Weight: 112 lb (50.8 kg)  Height: 5' (1.524 m)     Physical Exam Vitals reviewed.  Constitutional:      Appearance: Normal appearance. She is well-developed.  HENT:     Head: Normocephalic and atraumatic.     Nose: Nose normal. No congestion or rhinorrhea.     Comments: Sinuses nontender    Mouth/Throat:     Mouth: Mucous membranes are moist.     Pharynx: No oropharyngeal exudate or posterior oropharyngeal erythema.     Comments: No apparent focal tongue swelling or oral rash.  No wounds. Eyes:     Conjunctiva/sclera: Conjunctivae normal.     Pupils: Pupils are equal, round, and  reactive to light.  Neck:     Vascular: No carotid bruit.  Cardiovascular:     Rate and Rhythm: Normal rate. Rhythm irregular.     Heart sounds: Murmur heard.     Comments: 2 out of 6 systolic murmur, episodic ectopic beat.  Normal rate Pulmonary:     Effort: Pulmonary effort is normal. No respiratory distress.     Breath sounds: Normal breath sounds. No stridor. No wheezing.     Comments: Speaking and clearing secretions normally.  No stridor. Abdominal:     Palpations: Abdomen is soft. There is no pulsatile mass.     Tenderness: There is no abdominal tenderness.  Musculoskeletal:     Right lower leg: No edema.     Left lower leg: No edema.  Skin:    General: Skin is warm and dry.  Neurological:     Mental Status: She is alert and oriented to person, place, and time.  Psychiatric:        Mood and Affect: Mood normal.        Behavior: Behavior normal.        Assessment & Plan:  BENNYE NIX is a 88 y.o. female . PVC's (premature ventricular contractions) Essential hypertension - Plan: amLODipine (NORVASC) 2.5 MG tablet  -Still with intermittent PVCs, asymptomatic, she has chosen to take amlodipine 2.5 mg 3 times daily and feels like she tolerates this dose better than 5 mg in the morning, 2.5 mg at night.  Although atypical dosing, we will continue the 2.5 mg 3 times daily for now as overall tolerated.  Borderline elevated in office, continue to  monitor with recheck next few months.  Adjustment disorder with anxiety  -Improved.  No further intervention at this time.  Nasal congestion - Plan: ipratropium (ATROVENT) 0.06 % nasal spray  -Unfortunately intolerant of Flonase.  Does not sound like true sinus infection.  Prior ENT note reviewed.  Recommended she try Atrovent nasal spray another time as I do not see any documentation about allergy or specific side effects with that medication but she did complain of headache, dizziness with Flonase.  If side effects with  Atrovent nasal spray, return for discussion of other options.  Okay to continue saline nasal spray or lavage if helpful.  RTC precautions.  Tongue swelling - Plan: Ambulatory referral to Allergy  -Noted in the morning, improves with Allegra and with time during the day.  Question irritation from dentures, has not yet followed up with dentist as discussed.  She plans to call for appointment soon.  We will also refer to allergist for second opinion to see if other testing or evaluation indicated.  I do not appreciate significant swelling on exam today.  No difficulty clearing secretions, normal respiratory effort and voice.  Meds ordered this encounter  Medications   ipratropium (ATROVENT) 0.06 % nasal spray    Sig: Place 1-2 sprays into both nostrils in the morning and at bedtime. As needed for nasal congestion    Dispense:  15 mL    Refill:  5   amLODipine (NORVASC) 2.5 MG tablet    Sig: Take 1 tablet (2.5 mg total) by mouth in the morning, at noon, and at bedtime.    Dispense:  270 tablet    Refill:  1   Patient Instructions  Ok to increase the saline nasal spray to a few times per day or saline wash - just make sure the device is cleaned frequently and clean water if you use one of those devices. I think you should try the atrovent nasal spray as recommended by ENT last year. If new side effects or problems with that med we can discuss other options. Follow up if that occurs.   Okay to continue amlodipine 2.5 mg 3 times per day.  Please call your dentist for appointment to discuss the tongue swelling and symptoms.  I will also refer you to allergist.  Stay on Allegra daily.  If any worsening symptoms be seen right away through urgent care or emergency room.    Signed,   Merri Ray, MD [facility] 06/02/22 1:38 PM

## 2022-06-03 ENCOUNTER — Ambulatory Visit (INDEPENDENT_AMBULATORY_CARE_PROVIDER_SITE_OTHER): Payer: Medicare Other | Admitting: Neurology

## 2022-06-03 ENCOUNTER — Encounter: Payer: Self-pay | Admitting: Neurology

## 2022-06-03 VITALS — BP 165/74 | HR 78 | Wt 112.6 lb

## 2022-06-03 DIAGNOSIS — R531 Weakness: Secondary | ICD-10-CM

## 2022-06-03 DIAGNOSIS — G5603 Carpal tunnel syndrome, bilateral upper limbs: Secondary | ICD-10-CM | POA: Diagnosis not present

## 2022-06-03 NOTE — Patient Instructions (Signed)
The numbness in hands at night may be carpal tunnel syndrome.  If it bothers you, you can buy a pair of wrist splints to wear at night

## 2022-07-03 ENCOUNTER — Other Ambulatory Visit: Payer: Self-pay | Admitting: Physician Assistant

## 2022-07-03 DIAGNOSIS — I10 Essential (primary) hypertension: Secondary | ICD-10-CM

## 2022-07-07 ENCOUNTER — Ambulatory Visit: Payer: Medicare Other | Admitting: Allergy

## 2022-07-07 ENCOUNTER — Encounter: Payer: Self-pay | Admitting: Allergy

## 2022-07-07 VITALS — BP 164/70 | HR 87 | Temp 98.2°F | Resp 12 | Ht <= 58 in | Wt 113.0 lb

## 2022-07-07 DIAGNOSIS — R220 Localized swelling, mass and lump, head: Secondary | ICD-10-CM | POA: Diagnosis not present

## 2022-07-07 DIAGNOSIS — R0982 Postnasal drip: Secondary | ICD-10-CM

## 2022-07-07 DIAGNOSIS — T783XXD Angioneurotic edema, subsequent encounter: Secondary | ICD-10-CM | POA: Diagnosis not present

## 2022-07-07 MED ORDER — FAMOTIDINE 20 MG PO TABS: ORAL_TABLET | ORAL | 5 refills | Status: AC

## 2022-07-07 NOTE — Patient Instructions (Addendum)
Tongue swelling - previous testing for swelling and food allergy from 2021 was all negative - for tongue swelling recommend complete antihistamine blockade with Allegra 1/2 tab twice a day AND Pepcid 1/2 tab twice a day.   - agree with seeing your dentist about refitting your dentures as this could be causing some microtrauma or pressure on the tongue that can lead to tongue swelling - for nasal drainage/post-nasal drip can use Astepro nasal spray 1-2 sprays per nostril 1-2 times a day.  This is an over-the-counter nasal spray  Follow-up in 2-3 months or sooner if needed

## 2022-07-07 NOTE — Progress Notes (Signed)
Follow-up Note  RE: Gina Pace MRN: [id number] DOB: [birth year] Date of Office Visit: 07/07/2022   History of present illness: Gina Pace is a 88 y.o. female presenting today for tongue swelling.  Her last visit with me was on 10/30/19.    She states for months now she has been waking up with tongue swelling and can swell during the daytime.  She is concerned it could be related to her current dentures as she states the current ones she has a underbite (states her teeth before met together).  She feels they were made too small and her tongue doesn't have much space to move about.  She does plan to get in with her dentist to assess her bite and current dentures.  She states with the current tongue swelling she will take Allegra which can help a little bit.  She does feel her breathing is a bit impaired in the mornings when she wakes up.  She states she is able to swallow without issue.  She takes the Allegra 1/2 tab first thing in the morning and will take the other 1/2 at night.   She does states she has a lot of nasal drainage down the throat.   Review of systems: Review of Systems  Constitutional: Negative.   HENT:  Positive for postnasal drip. Negative for trouble swallowing.   Eyes: Negative.   Respiratory: Negative.    Cardiovascular: Negative.   Gastrointestinal: Negative.   Musculoskeletal: Negative.   Skin: Negative.   Allergic/Immunologic: Negative.   Neurological: Negative.      All other systems negative unless noted above in HPI  Past medical/social/surgical/family history have been reviewed and are unchanged unless specifically indicated below.  No changes  Medication List: Current Outpatient Medications  Medication Sig Dispense Refill   Acetaminophen 325 MG CAPS Take 325 mg by mouth daily as needed.     amLODipine (NORVASC) 2.5 MG tablet Take 1 tablet (2.5 mg total) by mouth in the morning, at noon, and at bedtime. 270 tablet 1   famotidine  (PEPCID) 20 MG tablet Take 1/2 tablet twice daily. 30 tablet 5   fexofenadine (ALLEGRA) 60 MG tablet Take 60 mg by mouth daily. Take 1/2 tablet twice daily     ipratropium (ATROVENT) 0.06 % nasal spray Place 1-2 sprays into both nostrils in the morning and at bedtime. As needed for nasal congestion 15 mL 5   No current facility-administered medications for this visit.     Known medication allergies: Allergies  Allergen Reactions   Chocolate Anaphylaxis    Migraines    Prednisone Hypertension   Shellfish Allergy    Chocolate Flavor    Clindamycin    Clindamycin/Lincomycin    Codeine    Erythromycin    Erythromycin Base    Keflex [Cephalexin]    Lisinopril     Angioedema    Other    Penicillins    Sulfa Antibiotics      Physical examination: Blood pressure (!) 164/70, pulse 87, temperature 98.2 F (36.8 C), temperature source Temporal, resp. rate 12, height 4' 10" (1.473 m), weight 113 lb (51.3 kg), SpO2 99 %.  General: Alert, interactive, in no acute distress. HEENT: PERRLA, TMs pearly gray, turbinates non-edematous without discharge, post-pharynx non erythematous, no visible trauma to tongue Neck: Supple without lymphadenopathy. Lungs: Clear to auscultation without wheezing, rhonchi or rales. {no increased work of breathing. CV: Normal S1, S2 without murmurs. Abdomen: Nondistended, nontender. Skin: Warm and dry, without lesions  or rashes. Extremities:  No clubbing, cyanosis or edema. Neuro:   Grossly intact.  Diagnositics/Labs: None today  Assessment and plan: Tongue swelling/angioedema Post nasal drip - previous testing for swelling and food allergy from 2021 was all negative - for tongue swelling recommend complete antihistamine blockade with Allegra 1/2 tab twice a day AND Pepcid 1/2 tab twice a day.   - agree with seeing your dentist about refitting your dentures as this could be causing some microtrauma or pressure on the tongue that can lead to tongue  swelling - for nasal drainage/post-nasal drip can use Astepro nasal spray 1-2 sprays per nostril 1-2 times a day.  This is an over-the-counter nasal spray  Follow-up in 2-3 months or sooner if needed   I appreciate the opportunity to take part in Gina Pace's care. Please do not hesitate to contact me with questions.  Sincerely,   Prudy Feeler, MD Allergy/Immunology [facility]

## 2022-07-19 ENCOUNTER — Telehealth: Payer: Self-pay | Admitting: Family Medicine

## 2022-07-19 NOTE — Telephone Encounter (Signed)
Left message for patient to call back and schedule Medicare Annual Wellness Visit (AWV) in office.   If not able to come in office, please offer to do virtually or by telephone.  Left office number and my jabber [phone].  Last AWV:07/22/2021   Please schedule at anytime with Nurse Health Advisor.

## 2022-08-17 ENCOUNTER — Ambulatory Visit (INDEPENDENT_AMBULATORY_CARE_PROVIDER_SITE_OTHER): Payer: Medicare Other | Admitting: *Deleted

## 2022-08-17 DIAGNOSIS — Z0000 Encounter for general adult medical examination without abnormal findings: Secondary | ICD-10-CM

## 2022-08-17 NOTE — Progress Notes (Signed)
Subjective:   Gina Pace is a 88 y.o. female who presents for Medicare Annual (Subsequent) preventive examination.  I connected with  Alyric Parkin Kizer on 08/17/22 by a telephone enabled telemedicine application and verified that I am speaking with the correct person using two identifiers.   I discussed the limitations of evaluation and management by telemedicine. The patient expressed understanding and agreed to proceed.  Patient location: home  Provider location: Tele-health- office    Review of Systems     Cardiac Risk Factors include: advanced age (>70mn, >>88women);hypertension     Objective:    Today's Vitals   There is no height or weight on file to calculate BMI.     08/17/2022   11:35 AM 06/03/2022   11:18 AM 07/22/2021   11:35 AM 11/20/2019    1:12 PM 03/02/2019    6:46 PM 02/27/2019    9:21 PM  Advanced Directives  Does Patient Have a Medical Advance Directive? Yes Yes Yes Yes No No  Type of [place] will    Does patient want to make changes to medical advance directive?    No - Patient declined    Copy of [place]? Yes - validated most recent copy scanned in chart (See row information)  No - copy requested No - copy requested    Would patient like information on creating a medical advance directive?     No - Patient declined No - Patient declined    Current Medications (verified) Outpatient Encounter Medications as of 08/17/2022  Medication Sig   Acetaminophen 325 MG CAPS Take 325 mg by mouth daily as needed.   amLODipine (NORVASC) 2.5 MG tablet Take 1 tablet (2.5 mg total) by mouth in the morning, at noon, and at bedtime.   fexofenadine (ALLEGRA) 60 MG tablet Take 60 mg by mouth daily. Take 1/2 tablet twice daily   ipratropium (ATROVENT) 0.06 % nasal spray Place 1-2 sprays into both nostrils in the  morning and at bedtime. As needed for nasal congestion   famotidine (PEPCID) 20 MG tablet Take 1/2 tablet twice daily. (Patient not taking: Reported on 08/17/2022)   No facility-administered encounter medications on file as of 08/17/2022.    Allergies (verified) Chocolate, Prednisone, Shellfish allergy, Chocolate flavor, Clindamycin, Clindamycin/lincomycin, Codeine, Erythromycin, Erythromycin base, Keflex [cephalexin], Lisinopril, Other, Penicillins, and Sulfa antibiotics   History: Past Medical History:  Diagnosis Date   Allergy    Cataract    GI bleed    Hypertension 02/2019   Newly diagnosed    Osteoporosis    Tuberculosis    Ulcer of gastric fundus, acute    Past Surgical History:  Procedure Laterality Date   ABDOMINAL HYSTERECTOMY     APPENDECTOMY     Bladder     Bladder tack   EYE SURGERY     Rectum     Rectal tear repair   Family History  Problem Relation Age of Onset   CAD Mother 760  Heart attack Mother    CAD Father 624  Heart attack Father    Congestive Heart Failure Sister    CAD Sister        Hx of CABG   Hypercalcemia Sister    Emphysema Brother    Social History   Socioeconomic History   Marital status: Widowed    Spouse name: Not on file   Number  of children: 3   Years of education: Not on file   Highest education level: Not on file  Occupational History   Occupation: Clinical cytogeneticist    Comment: Retired    Occupation: Higher education careers adviser     Comment: Retired   Tobacco Use   Smoking status: Never   Smokeless tobacco: Never  Scientific laboratory technician Use: Never used  Substance and Sexual Activity   Alcohol use: Yes    Comment: 1 Glass of Scotch a Night   Drug use: Never   Sexual activity: Not Currently  Other Topics Concern   Not on file  Social History Narrative   Has 3 children 2 daughters and a son, who are very attentive.    Lives alone in a townhouse.    Able to perform all ADL's   Exercises daily   Belongs to social clubs   Does line  dancing.    Social Determinants of Health   Financial Resource Strain: Low Risk  (08/17/2022)   Overall Financial Resource Strain (CARDIA)    Difficulty of Paying Living Expenses: Not hard at all  Food Insecurity: No Food Insecurity (08/17/2022)   Hunger Vital Sign    Worried About Running Out of Food in the Last Year: Never true    Ran Out of Food in the Last Year: Never true  Transportation Needs: No Transportation Needs (08/17/2022)   PRAPARE - Hydrologist (Medical): No    Lack of Transportation (Non-Medical): No  Physical Activity: Inactive (08/17/2022)   Exercise Vital Sign    Days of Exercise per Week: 0 days    Minutes of Exercise per Session: 0 min  Stress: No Stress Concern Present (08/17/2022)   [place]    Feeling of Stress : Not at all  Social Connections: Moderately Integrated (08/17/2022)   Social Connection and Isolation Panel [NHANES]    Frequency of Communication with Friends and Family: More than three times a week    Frequency of Social Gatherings with Friends and Family: Twice a week    Attends Religious Services: More than 4 times per year    Active Member of [employer] or Organizations: Yes    Attends Archivist Meetings: More than 4 times per year    Marital Status: Widowed    Tobacco Counseling Counseling given: Not Answered   Clinical Intake:  Pre-visit preparation completed: Yes  Pain : No/denies pain     Diabetes: No  How often do you need to have someone help you when you read instructions, pamphlets, or other written materials from your doctor or pharmacy?: 1 - Never  Diabetic?  no  Interpreter Needed?: No  Information entered by :: Leroy Kennedy LPN   Activities of Daily Living    08/17/2022   11:40 AM  In your present state of health, do you have any difficulty performing the following activities:  Hearing? 1  Vision? 0   Difficulty concentrating or making decisions? 0  Walking or climbing stairs? 0  Doing errands, shopping? 0  Preparing Food and eating ? N  Using the Toilet? N  In the past six months, have you accidently leaked urine? Y  Do you have problems with loss of bowel control? N  Managing your Medications? N  Managing your Finances? N  Housekeeping or managing your Housekeeping? N    Patient Care Team: Wendie Agreste, MD as PCP - General (Family Medicine)  Lelon Perla, MD as PCP - Cardiology (Cardiology)  Indicate any recent Medical Services you may have received from other than [facility] providers in the past year (date may be approximate).     Assessment:   This is a routine wellness examination for Enna.  Hearing/Vision screen Hearing Screening - Comments:: Hearing aids bilateral  Vision Screening - Comments:: Up to date [facility]  Dietary issues and exercise activities discussed: Current Exercise Habits: The patient does not participate in regular exercise at present   Goals Addressed             This Visit's Progress    Patient Stated       Continue current lifestyle       Depression Screen    08/17/2022   11:42 AM 02/28/2022    1:13 PM 10/25/2021    1:39 PM 07/22/2021   11:33 AM 02/25/2021   11:29 AM 11/25/2020   10:41 AM 09/11/2020   12:01 PM  PHQ 2/9 Scores  PHQ - 2 Score 0 0 0 0 0 0 0  PHQ- 9 Score 0 0 0  0      Fall Risk    08/17/2022   11:35 AM 06/03/2022   11:17 AM 02/28/2022    1:13 PM 02/08/2022   11:24 AM 10/25/2021    1:38 PM  [place] in the past year? 0 0 0 0 0  Number falls in past yr: 0 0 0 0 0  Injury with Fall? 0 0 0 0 0  Risk for fall due to :   No Fall Risks No Fall Risks No Fall Risks  Follow up Falls evaluation completed;Education provided;Falls prevention discussed  Falls evaluation completed Falls evaluation completed Falls evaluation completed    FALL RISK PREVENTION PERTAINING TO THE HOME:  Any stairs in or around the  home? Yes   has Chair lyft If so, are there any without handrails? No  Home free of loose throw rugs in walkways, pet beds, electrical cords, etc? Yes  Adequate lighting in your home to reduce risk of falls? Yes   ASSISTIVE DEVICES UTILIZED TO PREVENT FALLS:  Life alert? Yes  Use of a cane, walker or w/c? No  Grab bars in the bathroom? Yes  Shower chair or bench in shower? No  Elevated toilet seat or a handicapped toilet? Yes   TIMED UP AND GO:  Was the test performed? No .    Cognitive Function:        08/17/2022   11:38 AM 11/20/2019    1:13 PM  6CIT Screen  What Year? 0 points 0 points  What month? 0 points 0 points  What time? 0 points 0 points  Count back from 20 0 points 2 points  Months in reverse 0 points 2 points  Repeat phrase 0 points 0 points  Total Score 0 points 4 points    Immunizations Immunization History  Administered Date(s) Administered   PFIZER Comirnaty(Gray Top)Covid-19 Tri-Sucrose Vaccine 04/03/2020, 04/30/2020    TDAP status: Due, Education has been provided regarding the importance of this vaccine. Advised may receive this vaccine at local pharmacy or Health Dept. Aware to provide a copy of the vaccination record if obtained from local pharmacy or Health Dept. Verbalized acceptance and understanding.  Flu Vaccine status: Declined, Education has been provided regarding the importance of this vaccine but patient still declined. Advised may receive this vaccine at local pharmacy or Health Dept. Aware to provide a copy of  the vaccination record if obtained from local pharmacy or Health Dept. Verbalized acceptance and understanding.  Pneumococcal vaccine status: Declined,  Education has been provided regarding the importance of this vaccine but patient still declined. Advised may receive this vaccine at local pharmacy or Health Dept. Aware to provide a copy of the vaccination record if obtained from local pharmacy or Health Dept. Verbalized acceptance  and understanding.   Covid-19 vaccine status: Declined, Education has been provided regarding the importance of this vaccine but patient still declined. Advised may receive this vaccine at local pharmacy or Health Dept.or vaccine clinic. Aware to provide a copy of the vaccination record if obtained from local pharmacy or Health Dept. Verbalized acceptance and understanding.  Qualifies for Shingles Vaccine? Yes   Zostavax completed No   Shingrix Completed?: No.    Education has been provided regarding the importance of this vaccine. Patient has been advised to call insurance company to determine out of pocket expense if they have not yet received this vaccine. Advised may also receive vaccine at local pharmacy or Health Dept. Verbalized acceptance and understanding.  Screening Tests Health Maintenance  Topic Date Due   DTaP/Tdap/Td (1 - Tdap) Never done   Zoster Vaccines- Shingrix (1 of 2) 09/01/2022 (Originally 02/01/1975)   COVID-19 Vaccine (3 - 2023-24 season) 09/02/2022 (Originally 05/06/2022)   Pneumonia Vaccine 64+ Years old (1 - PCV) 11/26/2022 (Originally 02/02/1992)   INFLUENZA VACCINE  12/05/2022 (Originally 04/05/2022)   Medicare Annual Wellness (AWV)  08/18/2023   DEXA SCAN  Completed   HPV VACCINES  Aged Out    Health Maintenance  Health Maintenance Due  Topic Date Due   DTaP/Tdap/Td (1 - Tdap) Never done    Colorectal cancer screening: No longer required.   Mammogram status: No longer required due to age.  Bone Density status: Completed 2023. Results reflect: Bone density results: OSTEOPOROSIS. Repeat every 2 years.  Lung Cancer Screening: (Low Dose CT Chest recommended if Age 60-80 years, 30 pack-year currently smoking OR have quit w/in 15years.) does not qualify.   Lung Cancer Screening Referral:   Additional Screening:  Hepatitis C Screening: does not qualify; Completed   Vision Screening: Recommended annual ophthalmology exams for early detection of glaucoma and  other disorders of the eye. Is the patient up to date with their annual eye exam?  Yes  Who is the provider or what is the name of the office in which the patient attends annual eye exams? [facility] If pt is not established with a provider, would they like to be referred to a provider to establish care? No .   Dental Screening: Recommended annual dental exams for proper oral hygiene  Community Resource Referral / Chronic Care Management: CRR required this visit?  No   CCM required this visit?  No      Plan:     I have personally reviewed and noted the following in the patient's chart:   Medical and social history Use of alcohol, tobacco or illicit drugs  Current medications and supplements including opioid prescriptions. Patient is not currently taking opioid prescriptions. Functional ability and status Nutritional status Physical activity Advanced directives List of other physicians Hospitalizations, surgeries, and ER visits in previous 12 months Vitals Screenings to include cognitive, depression, and falls Referrals and appointments  In addition, I have reviewed and discussed with patient certain preventive protocols, quality metrics, and best practice recommendations. A written personalized care plan for preventive services as well as general preventive health recommendations were provided to  patient.     Leroy Kennedy, LPN   30/06/4044   Nurse Notes:

## 2022-08-17 NOTE — Patient Instructions (Signed)
Gina Pace , Thank you for taking time to come for your Medicare Wellness Visit. I appreciate your ongoing commitment to your health goals. Please review the following plan we discussed and let me know if I can assist you in the future.   These are the goals we discussed:  Goals      Increase physical activity     Patient Stated     Continue current lifestyle        This is a list of the screening recommended for you and due dates:  Health Maintenance  Topic Date Due   DTaP/Tdap/Td vaccine (1 - Tdap) Never done   Zoster (Shingles) Vaccine (1 of 2) 09/01/2022*   COVID-19 Vaccine (3 - 2023-24 season) 09/02/2022*   Pneumonia Vaccine (1 - PCV) 11/26/2022*   Flu Shot  12/05/2022*   Medicare Annual Wellness Visit  08/18/2023   DEXA scan (bone density measurement)  Completed   HPV Vaccine  Aged Out  *Topic was postponed. The date shown is not the original due date.    Advanced directives: on file     Preventive Care 86 Years and Older, Female Preventive care refers to lifestyle choices and visits with your health care provider that can promote health and wellness. What does preventive care include? A yearly physical exam. This is also called an annual well check. Dental exams once or twice a year. Routine eye exams. Ask your health care provider how often you should have your eyes checked. Personal lifestyle choices, including: Daily care of your teeth and gums. Regular physical activity. Eating a healthy diet. Avoiding tobacco and drug use. Limiting alcohol use. Practicing safe sex. Taking low-dose aspirin every day. Taking vitamin and mineral supplements as recommended by your health care provider. What happens during an annual well check? The services and screenings done by your health care provider during your annual well check will depend on your age, overall health, lifestyle risk factors, and family history of disease. Counseling  Your health care provider may ask  you questions about your: Alcohol use. Tobacco use. Drug use. Emotional well-being. Home and relationship well-being. Sexual activity. Eating habits. History of falls. Memory and ability to understand (cognition). Work and work Statistician. Reproductive health. Screening  You may have the following tests or measurements: Height, weight, and BMI. Blood pressure. Lipid and cholesterol levels. These may be checked every 5 years, or more frequently if you are over 86 years old. Skin check. Lung cancer screening. You may have this screening every year starting at age 86 if you have a 30-pack-year history of smoking and currently smoke or have quit within the past 15 years. Fecal occult blood test (FOBT) of the stool. You may have this test every year starting at age 86. Flexible sigmoidoscopy or colonoscopy. You may have a sigmoidoscopy every 5 years or a colonoscopy every 10 years starting at age 40. Hepatitis C blood test. Hepatitis B blood test. Sexually transmitted disease (STD) testing. Diabetes screening. This is done by checking your blood sugar (glucose) after you have not eaten for a while (fasting). You may have this done every 1-3 years. Bone density scan. This is done to screen for osteoporosis. You may have this done starting at age 86. Mammogram. This may be done every 1-2 years. Talk to your health care provider about how often you should have regular mammograms. Talk with your health care provider about your test results, treatment options, and if necessary, the need for more tests. Vaccines  Your health care provider may recommend certain vaccines, such as: Influenza vaccine. This is recommended every year. Tetanus, diphtheria, and acellular pertussis (Tdap, Td) vaccine. You may need a Td booster every 10 years. Zoster vaccine. You may need this after age 39. Pneumococcal 13-valent conjugate (PCV13) vaccine. One dose is recommended after age 25. Pneumococcal  polysaccharide (PPSV23) vaccine. One dose is recommended after age 42. Talk to your health care provider about which screenings and vaccines you need and how often you need them. This information is not intended to replace advice given to you by your health care provider. Make sure you discuss any questions you have with your health care provider. Document Released: 09/18/2015 Document Revised: 05/11/2016 Document Reviewed: 06/23/2015 Elsevier Interactive Patient Education  2017 [place] Prevention in the Home Falls can cause injuries. They can happen to people of all ages. There are many things you can do to make your home safe and to help prevent falls. What can I do on the outside of my home? Regularly fix the edges of walkways and driveways and fix any cracks. Remove anything that might make you trip as you walk through a door, such as a raised step or threshold. Trim any bushes or trees on the path to your home. Use bright outdoor lighting. Clear any walking paths of anything that might make someone trip, such as rocks or tools. Regularly check to see if handrails are loose or broken. Make sure that both sides of any steps have handrails. Any raised decks and porches should have guardrails on the edges. Have any leaves, snow, or ice cleared regularly. Use sand or salt on walking paths during winter. Clean up any spills in your garage right away. This includes oil or grease spills. What can I do in the bathroom? Use night lights. Install grab bars by the toilet and in the tub and shower. Do not use towel bars as grab bars. Use non-skid mats or decals in the tub or shower. If you need to sit down in the shower, use a plastic, non-slip stool. Keep the floor dry. Clean up any water that spills on the floor as soon as it happens. Remove soap buildup in the tub or shower regularly. Attach bath mats securely with double-sided non-slip rug tape. Do not have throw rugs and other  things on the floor that can make you trip. What can I do in the bedroom? Use night lights. Make sure that you have a light by your bed that is easy to reach. Do not use any sheets or blankets that are too big for your bed. They should not hang down onto the floor. Have a firm chair that has side arms. You can use this for support while you get dressed. Do not have throw rugs and other things on the floor that can make you trip. What can I do in the kitchen? Clean up any spills right away. Avoid walking on wet floors. Keep items that you use a lot in easy-to-reach places. If you need to reach something above you, use a strong step stool that has a grab bar. Keep electrical cords out of the way. Do not use floor polish or wax that makes floors slippery. If you must use wax, use non-skid floor wax. Do not have throw rugs and other things on the floor that can make you trip. What can I do with my stairs? Do not leave any items on the stairs. Make sure that there are  handrails on both sides of the stairs and use them. Fix handrails that are broken or loose. Make sure that handrails are as long as the stairways. Check any carpeting to make sure that it is firmly attached to the stairs. Fix any carpet that is loose or worn. Avoid having throw rugs at the top or bottom of the stairs. If you do have throw rugs, attach them to the floor with carpet tape. Make sure that you have a light switch at the top of the stairs and the bottom of the stairs. If you do not have them, ask someone to add them for you. What else can I do to help prevent falls? Wear shoes that: Do not have high heels. Have rubber bottoms. Are comfortable and fit you well. Are closed at the toe. Do not wear sandals. If you use a stepladder: Make sure that it is fully opened. Do not climb a closed stepladder. Make sure that both sides of the stepladder are locked into place. Ask someone to hold it for you, if possible. Clearly  mark and make sure that you can see: Any grab bars or handrails. First and last steps. Where the edge of each step is. Use tools that help you move around (mobility aids) if they are needed. These include: Canes. Walkers. Scooters. Crutches. Turn on the lights when you go into a dark area. Replace any light bulbs as soon as they burn out. Set up your furniture so you have a clear path. Avoid moving your furniture around. If any of your floors are uneven, fix them. If there are any pets around you, be aware of where they are. Review your medicines with your doctor. Some medicines can make you feel dizzy. This can increase your chance of falling. Ask your doctor what other things that you can do to help prevent falls. This information is not intended to replace advice given to you by your health care provider. Make sure you discuss any questions you have with your health care provider. Document Released: 06/18/2009 Document Revised: 01/28/2016 Document Reviewed: 09/26/2014 Elsevier Interactive Patient Education  2017 [employer].

## 2022-09-15 ENCOUNTER — Ambulatory Visit: Payer: Medicare Other | Admitting: Family Medicine

## 2022-09-19 ENCOUNTER — Ambulatory Visit: Payer: Medicare Other | Admitting: Family Medicine

## 2022-09-22 ENCOUNTER — Ambulatory Visit: Payer: Medicare Other | Admitting: Allergy

## 2022-09-26 ENCOUNTER — Ambulatory Visit (INDEPENDENT_AMBULATORY_CARE_PROVIDER_SITE_OTHER): Payer: Medicare Other | Admitting: Family Medicine

## 2022-09-26 VITALS — BP 118/78 | HR 66 | Temp 97.8°F | Ht <= 58 in | Wt 113.8 lb

## 2022-09-26 DIAGNOSIS — R0981 Nasal congestion: Secondary | ICD-10-CM | POA: Diagnosis not present

## 2022-09-26 DIAGNOSIS — I10 Essential (primary) hypertension: Secondary | ICD-10-CM

## 2022-09-26 DIAGNOSIS — R220 Localized swelling, mass and lump, head: Secondary | ICD-10-CM | POA: Diagnosis not present

## 2022-09-26 NOTE — Patient Instructions (Addendum)
No change in blood pressure meds for now. Numbers look good.   I recommend follow up with Dr. Nelva Bush (allergist) as planned to discuss other options for nasal congestion, but you can try the Astepro nasal spray over the counter as she recommended on your last visit.   Glad to hear that the tongue swelling has improved. Continue same meds and follow up with allergist as planned. Still may be helpful to meet with dentist as well.   Return to the clinic or go to the nearest emergency room if any of your symptoms worsen or new symptoms occur.

## 2022-09-26 NOTE — Progress Notes (Signed)
Subjective:  Patient ID: Gina Pace, female    DOB: [birth year]  Age: 89 y.o. MRN: [id number]  CC:  Chief Complaint  Patient presents with   Hypertension    Pt notes no concerns    Nasal Congestion    Pt notes the spray dries her up and then she cannot get anything out. Stopped spray and just works up the mucous in the mornings notes has been taking alegra daily for several years did discuss changing to another allergy to see if this would help again    Oral Swelling    Pt notes she is taking a med to help with this has noted some improvement and is following up with specialist     HPI Gina Pace presents for   Chronic nasal congestion Discussed at multiple prior visits.  Allegra one half twice daily for allergies, Atrovent nasal spray previously prescribed and Flonase but that caused headache/dizziness.  More congestion at her September visit.  She was using saline nasal spray every other day.  She has seen ENT in 2022, recommended Atrovent nasal spray twice per day recommended.  Astepro was option from allergist in November.  2 to 57-monthfollow-up recommended.Feels like atrovent causes too much drying and then unable to get mucus out.  Uses 2 times per week. Some HA when too dried out.  Saline ns - not using that recently - did not seem to help.   She has noted tongue swelling previously, followed by her dentist.  We have discussed this previously, question if irritation from dentures.  She was also referred to allergist at her September visit.  Note reviewed from November 2.  Continued on Allegra half tab twice a day and added Pepcid half tab twice a day for antihistamine blockade.  Prior testing for swelling and food allergy from 2021 was negative.  Also recommended follow-up with dentist for refitting of dentures for possible microtrauma or pressure on the tongue that could lead to tongue swelling. Tongue swelling has improved on pepcid BID and allegra BID. (Half  doses each).  Has not yet met with dentist.  Brings up phlegm in the morning.   Hypertension: With history of PVCs.Last discussed in September.  History of ventricular bigeminy, improved with decrease caffeine.  Ultimately is found that 2.5 mg amlodipine 3 times daily dosing is tolerated best.  She had a headache if she took 5 mg at once in the morning. No CP/palpitations.  Home readings:none. Most recent labs from endocrinology noted.  BP Readings from Last 3 Encounters:  09/26/22 118/78  07/07/22 (!) 164/70  06/03/22 (!) 165/74   Lab Results  Component Value Date   CREATININE 0.85 05/10/2022   HM: Declines pneumonia and flu vaccines.   History Patient Active Problem List   Diagnosis Date Noted   Hypercalcemia 04/19/2021   Closed fracture of proximal end of left humerus 02/19/2020   Pain in joint of left shoulder 01/22/2020   Essential hypertension 02/14/2019   Dizziness and giddiness 02/14/2019   Past Medical History:  Diagnosis Date   Allergy    Cataract    GI bleed    Hypertension 02/2019   Newly diagnosed    Osteoporosis    Tuberculosis    Ulcer of gastric fundus, acute    Past Surgical History:  Procedure Laterality Date   ABDOMINAL HYSTERECTOMY     APPENDECTOMY     Bladder     Bladder tack   EYE SURGERY  Rectum     Rectal tear repair   Allergies  Allergen Reactions   Chocolate Anaphylaxis    Migraines    Prednisone Hypertension   Shellfish Allergy    Chocolate Flavor    Clindamycin    Clindamycin/Lincomycin    Codeine    Erythromycin    Erythromycin Base    Keflex [Cephalexin]    Lisinopril     Angioedema    Other    Penicillins    Sulfa Antibiotics    Prior to Admission medications   Medication Sig Start Date End Date Taking? Authorizing Provider  Acetaminophen 325 MG CAPS Take 325 mg by mouth daily as needed.   Yes [provider]  amLODipine (NORVASC) 2.5 MG tablet Take 1 tablet (2.5 mg total) by mouth in the morning, at  noon, and at bedtime. 06/02/22  Yes Wendie Agreste, MD  fexofenadine (ALLEGRA) 60 MG tablet Take 60 mg by mouth daily. Take 1/2 tablet twice daily   Yes [provider]  ipratropium (ATROVENT) 0.06 % nasal spray Place 1-2 sprays into both nostrils in the morning and at bedtime. As needed for nasal congestion 06/02/22  Yes Wendie Agreste, MD  famotidine (PEPCID) 20 MG tablet Take 1/2 tablet twice daily. Patient not taking: Reported on 08/17/2022 07/07/22   Kennith Gain, MD   Social History   Socioeconomic History   Marital status: Widowed    Spouse name: Not on file   Number of children: 3   Years of education: Not on file   Highest education level: Not on file  Occupational History   Occupation: Book Therapist, nutritional    Comment: Retired    Occupation: Higher education careers adviser     Comment: Retired   Tobacco Use   Smoking status: Never   Smokeless tobacco: Never  Scientific laboratory technician Use: Never used  Substance and Sexual Activity   Alcohol use: Yes    Comment: 1 Glass of Scotch a Night   Drug use: Never   Sexual activity: Not Currently  Other Topics Concern   Not on file  Social History Narrative   Has 3 children 2 daughters and a son, who are very attentive.    Lives alone in a townhouse.    Able to perform all ADL's   Exercises daily   Belongs to social clubs   Does line dancing.    Social Determinants of Health   Financial Resource Strain: Low Risk  (08/17/2022)   Overall Financial Resource Strain (CARDIA)    Difficulty of Paying Living Expenses: Not hard at all  Food Insecurity: No Food Insecurity (08/17/2022)   Hunger Vital Sign    Worried About Running Out of Food in the Last Year: Never true    Ran Out of Food in the Last Year: Never true  Transportation Needs: No Transportation Needs (08/17/2022)   PRAPARE - Hydrologist (Medical): No    Lack of Transportation (Non-Medical): No  Physical Activity: Inactive (08/17/2022)    Exercise Vital Sign    Days of Exercise per Week: 0 days    Minutes of Exercise per Session: 0 min  Stress: No Stress Concern Present (08/17/2022)   [place]    Feeling of Stress : Not at all  Social Connections: Moderately Integrated (08/17/2022)   Social Connection and Isolation Panel [NHANES]    Frequency of Communication with Friends and Family: More than three times a  week    Frequency of Social Gatherings with Friends and Family: Twice a week    Attends Religious Services: More than 4 times per year    Active Member of [employer] or Organizations: Yes    Attends Archivist Meetings: More than 4 times per year    Marital Status: Widowed  Intimate Partner Violence: Not At Risk (08/17/2022)   Humiliation, Afraid, Rape, and Kick questionnaire    Fear of Current or Ex-Partner: No    Emotionally Abused: No    Physically Abused: No    Sexually Abused: No    Review of Systems Per HPI.   Objective:   Vitals:   09/26/22 1325  BP: 118/78  Pulse: 66  Temp: 97.8 F (36.6 C)  TempSrc: Temporal  SpO2: 97%  Weight: 113 lb 12.8 oz (51.6 kg)  Height: '4\' 10"'$  (1.473 m)     Physical Exam Vitals reviewed.  Constitutional:      Appearance: Normal appearance. She is well-developed.  HENT:     Head: Normocephalic and atraumatic.     Nose: Nose normal.     Mouth/Throat:     Mouth: Mucous membranes are moist.     Pharynx: No oropharyngeal exudate or posterior oropharyngeal erythema.     Comments: No appreciable tongue/oral swelling.  Eyes:     Conjunctiva/sclera: Conjunctivae normal.     Pupils: Pupils are equal, round, and reactive to light.  Neck:     Vascular: No carotid bruit.  Cardiovascular:     Rate and Rhythm: Normal rate and regular rhythm.     Heart sounds: Normal heart sounds.  Pulmonary:     Effort: Pulmonary effort is normal.     Breath sounds: Normal breath sounds.  Abdominal:      Palpations: Abdomen is soft. There is no pulsatile mass.     Tenderness: There is no abdominal tenderness.  Musculoskeletal:     Right lower leg: No edema.     Left lower leg: No edema.  Skin:    General: Skin is warm and dry.  Neurological:     Mental Status: She is alert and oriented to person, place, and time.  Psychiatric:        Mood and Affect: Mood normal.        Behavior: Behavior normal.        Assessment & Plan:  ROLENE ANDRADES is a 89 y.o. female . Nasal congestion  -Unfortunately persistent symptoms with side effects on various treatments or incomplete treatment.  Did recommend she try Astepro as that was recommended by allergist most recently.  Follow-up with allergist to decide on other treatments if ineffective or side effects.  Essential hypertension  -Stable with current dosing of amlodipine, will continue same.  We did discuss this is atypical dosing of that medication but she has tried various approaches before and this seems to be tolerated well.  Will continue same.  Tongue swelling  -Improved with histamine blockade with both Allegra and Pepcid.  Continue same and follow-up as planned with allergist.  No orders of the defined types were placed in this encounter.  Patient Instructions  No change in blood pressure meds for now. Numbers look good.   I recommend follow up with Dr. Nelva Bush (allergist) as planned to discuss other options for nasal congestion, but you can try the Astepro nasal spray over the counter as she recommended on your last visit.   Glad to hear that the tongue swelling has improved. Continue  same meds and follow up with allergist as planned. Still may be helpful to meet with dentist as well.   Return to the clinic or go to the nearest emergency room if any of your symptoms worsen or new symptoms occur.       Signed,   Merri Ray, MD [place] 09/26/22 1:43  PM

## 2022-09-27 ENCOUNTER — Encounter: Payer: Self-pay | Admitting: Family Medicine

## 2022-11-02 ENCOUNTER — Ambulatory Visit: Payer: Medicare Other | Admitting: Allergy

## 2022-11-04 DIAGNOSIS — H353211 Exudative age-related macular degeneration, right eye, with active choroidal neovascularization: Secondary | ICD-10-CM | POA: Diagnosis not present

## 2022-11-07 ENCOUNTER — Ambulatory Visit: Payer: Medicare Other | Admitting: Physician Assistant

## 2022-11-09 ENCOUNTER — Ambulatory Visit: Payer: Medicare Other | Admitting: Internal Medicine

## 2022-11-12 NOTE — Patient Instructions (Incomplete)
Tongue swelling - previous testing for swelling and food allergy from 2021 was all negative - for tongue swelling recommend complete antihistamine blockade with Allegra 1/2 tab twice a day AND Pepcid 1/2 tab twice a day.   - agree with seeing your dentist about refitting your dentures as this could be causing some microtrauma or pressure on the tongue that can lead to tongue swelling - for nasal drainage/post-nasal drip can use Astepro nasal spray 1-2 sprays per nostril 1-2 times a day.  This is an over-the-counter nasal spray  Follow-up in 2-3 months or sooner if needed  

## 2022-11-14 ENCOUNTER — Encounter: Payer: Self-pay | Admitting: Family

## 2022-11-14 ENCOUNTER — Ambulatory Visit: Payer: Medicare Other | Admitting: Family

## 2022-11-14 VITALS — BP 140/70 | HR 82 | Temp 97.9°F | Resp 12 | Wt 112.6 lb

## 2022-11-14 DIAGNOSIS — R0982 Postnasal drip: Secondary | ICD-10-CM

## 2022-11-14 DIAGNOSIS — T783XXD Angioneurotic edema, subsequent encounter: Secondary | ICD-10-CM

## 2022-11-14 DIAGNOSIS — R220 Localized swelling, mass and lump, head: Secondary | ICD-10-CM | POA: Diagnosis not present

## 2022-11-14 NOTE — Progress Notes (Signed)
[address] [place] [address] Dept: [phone]  FOLLOW UP NOTE  Patient ID: Gina Pace, female    DOB: [birth year]  Age: 89 y.o. MRN: [id number] Date of Office Visit: 11/14/2022  Assessment  Chief Complaint: Angioedema (Tongue)  HPI Gina Pace is a 89 year old female who presents today for follow-up of tongue swelling/angioedema and postnasal drip.  She was last seen on July 07, 2022 by Dr. Nelva Bush.  She denies any new diagnosis or surgery since her last office visit.  She does mention that she received an injection for macular degeneration on March 1 and will get another injection in April.  Angioedema/tongue swelling: She reports that she continues to have tongue swelling especially in the morning. The swelling occurs almost every day. She reports that she continues to take Allegra half a tablet twice a day and she feels like it helps some. The swelling will last for about half an hour to three fourths of an hour before it goes down.  She reports that she is not short of breath and is able to swallow when she has the tongue swelling. For the past 4 days she has stopped taking Pepcid half a tablet twice a day to see if it made a difference and she cannot really tell a difference.  She did not ever go see her dentist about her dentures being the cause of her tongue swelling.  She mentions that she slurs her speech because her tongue is not in the correct position.  She also has difficulty chewing foods because of her dentures.  She reports that her tongue lays better when she takes her dentures out to soak.  She denies any fever, chills, or weight loss.  She is not eating any new foods, taking new medications, or using any new products.  She denies any concomitant gastrointestinal and cardiorespiratory symptoms.  She continues to have post nasal drainage down the throat and nasal and mouth dryness.  She reports that she has tried Astepro, Atrovent nasal spray, and  Flonase and they all do not do anything or cause a headache.  He has seen ear nose and throat in the past.   Drug Allergies:  Allergies  Allergen Reactions   Chocolate Anaphylaxis    Migraines    Prednisone Hypertension   Shellfish Allergy    Chocolate Flavor    Clindamycin    Clindamycin/Lincomycin    Codeine    Erythromycin    Erythromycin Base    Keflex [Cephalexin]    Lisinopril     Angioedema    Other    Penicillins    Sulfa Antibiotics     Review of Systems: Review of Systems  Constitutional:  Negative for chills, fever and weight loss.  HENT:         Reports postnasal drip and nasal dryness.  Denies rhinorrhea and nasal congestion.  Denies difficulty swallowing  Eyes:        Denies itchy watery eyes  Respiratory:  Negative for shortness of breath.   Cardiovascular:  Negative for chest pain and palpitations.  Gastrointestinal:        Denies heartburn or reflux symptoms  Skin:  Negative for itching and rash.  Neurological:  Positive for headaches.       Reports headaches since young     Physical Exam: BP (!) 140/70   Pulse 82   Temp 97.9 F (36.6 C) (Temporal)   Resp 12   Wt 112 lb 9.6 oz (51.1  kg)   SpO2 97%   BMI 23.53 kg/m    Physical Exam Constitutional:      Appearance: Normal appearance.  HENT:     Head: Normocephalic and atraumatic.     Comments: Pharynx normal, no visible trauma to tongue or angioedema.  Eyes normal, ears normal, nose: Bilateral lower turbinates mildly edematous and slightly erythematous with clear drainage noted    Right Ear: Tympanic membrane, ear canal and external ear normal.     Left Ear: Tympanic membrane, ear canal and external ear normal.     Mouth/Throat:     Mouth: Mucous membranes are moist.     Pharynx: Oropharynx is clear.  Eyes:     Conjunctiva/sclera: Conjunctivae normal.  Neck:     Comments: No lymphadenopathy Cardiovascular:     Rate and Rhythm: Normal rate and regular rhythm.     Heart sounds:  Normal heart sounds.  Pulmonary:     Effort: Pulmonary effort is normal.     Breath sounds: Normal breath sounds.     Comments: Lungs clear to auscultation Musculoskeletal:     Cervical back: Neck supple.  Skin:    General: Skin is warm.  Neurological:     Mental Status: She is alert and oriented to person, place, and time.  Psychiatric:        Mood and Affect: Mood normal.        Behavior: Behavior normal.        Thought Content: Thought content normal.        Judgment: Judgment normal.     Diagnostics: None  Assessment and Plan: 1. Tongue swelling   2. Angioedema, subsequent encounter   3. Post-nasal drip     No orders of the defined types were placed in this encounter.   Patient Instructions  Tongue swelling - previous testing for swelling and food allergy from 2021 was all negative - for tongue swelling recommend complete antihistamine blockade with Allegra 1/2 tab twice a day AND Pepcid 1/2 tab twice a day.   - Recommend seeing your dentist about refitting your dentures as this could be causing some microtrauma or pressure on the tongue that can lead to tongue swelling - for nasal drainage/post-nasal drip can use saline spray since Flonase, Astepro, and Atrovent nasal spray have caused side effects Follow-up in 2-3 months or sooner if needed  Return in about 3 months (around 02/14/2023), or if symptoms worsen or fail to improve.    Thank you for the opportunity to care for this patient.  Please do not hesitate to contact me with questions.  Althea Charon, FNP [facility]

## 2022-11-15 ENCOUNTER — Encounter: Payer: Self-pay | Admitting: Internal Medicine

## 2022-11-15 ENCOUNTER — Ambulatory Visit: Payer: Medicare Other | Admitting: Internal Medicine

## 2022-11-15 VITALS — BP 122/76 | HR 74 | Ht <= 58 in | Wt 114.8 lb

## 2022-11-15 DIAGNOSIS — R32 Unspecified urinary incontinence: Secondary | ICD-10-CM | POA: Diagnosis not present

## 2022-11-15 DIAGNOSIS — E213 Hyperparathyroidism, unspecified: Secondary | ICD-10-CM

## 2022-11-15 LAB — VITAMIN D 25 HYDROXY (VIT D DEFICIENCY, FRACTURES): VITD: 38.53 ng/mL (ref 30.00–100.00)

## 2022-11-15 LAB — BASIC METABOLIC PANEL
BUN: 22 mg/dL (ref 6–23)
CO2: 29 mEq/L (ref 19–32)
Calcium: 11 mg/dL — ABNORMAL HIGH (ref 8.4–10.5)
Chloride: 103 mEq/L (ref 96–112)
Creatinine, Ser: 0.89 mg/dL (ref 0.40–1.20)
GFR: 54.97 mL/min — ABNORMAL LOW (ref >60.00)
Glucose, Bld: 82 mg/dL (ref 70–99)
Potassium: 4.4 mEq/L (ref 3.5–5.1)
Sodium: 138 mEq/L (ref 135–145)

## 2022-11-15 LAB — ALBUMIN: Albumin: 4.2 g/dL (ref 3.5–5.2)

## 2022-11-15 NOTE — Progress Notes (Signed)
Name: Gina Pace  MRN/ DOB: [id number], 1924/10/27    Age/ Sex: 89 y.o., female     PCP: Shade Flood, MD   Reason for Endocrinology Evaluation: Primary Hyperparathyroidism     Initial Endocrinology Clinic Visit: 03/09/2021    PATIENT IDENTIFIER: Gina Pace is a 89 y.o., female with a past medical history of osteoporosis . She has followed with [facility] since 03/09/2021 for consultative assistance with management of her Primary hyperparathyroidism.   HISTORICAL SUMMARY: The patient was first noted with hypercalcemia intermittently since June 2020.  Serum calcium max level of 10.6 mg/DL in January 2021 (and corrected).  She has also been noted with elevated PTH of 74 PG/mL in April 2022.  She has a diagnosis of osteoporosis with a T score of -3.5 at the spine on 11/09/2021 She does have a history of humeral fracture in 2021   She was seen by Dr. Everardo All from July 2022 until February 2023.  She had no prior 24-hour urinary collection for calcium excretion at the time   She has no history of nephrolithiasis   Patient declines antiresorptive therapy as she is sensitive to medication, she understands her high risk for bone fractures  SUBJECTIVE:    Today (11/15/2022):  [name] is here for primary hyperparathyroidism and osteoporosis.    Weight has been stable  She is having chronic sinus issues  No recent renal stones  Denies falls or fractures Denies constipation She drinks plenty of water but no polydipsia   Denies local neck swelling   Pt does endorse urinary incontinency at night but no issues during the day, she also does not feel that she has frequency of urination at night   Vitamin D3 1000 IU daily  HISTORY:  Past Medical History:  Past Medical History:  Diagnosis Date   Allergy    Cataract    GI bleed    Hypertension 02/2019   Newly diagnosed    Osteoporosis    Tuberculosis    Ulcer of gastric fundus, acute     Past Surgical History:  Past Surgical History:  Procedure Laterality Date   ABDOMINAL HYSTERECTOMY     APPENDECTOMY     Bladder     Bladder tack   EYE SURGERY     Rectum     Rectal tear repair   Social History:  reports that she has never smoked. She has never used smokeless tobacco. She reports current alcohol use. She reports that she does not use drugs. Family History:  Family History  Problem Relation Age of Onset   CAD Mother 64   Heart attack Mother    CAD Father 74   Heart attack Father    Congestive Heart Failure Sister    CAD Sister        Hx of CABG   Hypercalcemia Sister    Emphysema Brother      HOME MEDICATIONS: Allergies as of 11/15/2022       Reactions   Chocolate Anaphylaxis   Migraines   Prednisone Hypertension   Shellfish Allergy    Chocolate Flavor    Clindamycin    Clindamycin/lincomycin    Codeine    Erythromycin    Erythromycin Base    Keflex [cephalexin]    Lisinopril    Angioedema   Other    Penicillins    Sulfa Antibiotics         Medication List        Accurate as  of November 15, 2022 11:23 AM. If you have any questions, ask your nurse or doctor.          Acetaminophen 325 MG Caps Take 325 mg by mouth daily as needed.   amLODipine 2.5 MG tablet Commonly known as: NORVASC Take 1 tablet (2.5 mg total) by mouth in the morning, at noon, and at bedtime.   famotidine 20 MG tablet Commonly known as: PEPCID Take 1/2 tablet twice daily.   fexofenadine 60 MG tablet Commonly known as: ALLEGRA Take 60 mg by mouth daily. Take 1/2 tablet twice daily   ipratropium 0.06 % nasal spray Commonly known as: ATROVENT Place 1-2 sprays into both nostrils in the morning and at bedtime. As needed for nasal congestion          OBJECTIVE:   PHYSICAL EXAM: VS: BP 122/76 (BP Location: Left Arm, Patient Position: Sitting, Cuff Size: Small)   Pulse 74   Ht 4\' 10"  (1.473 m)   Wt 114 lb 12.8 oz (52.1 kg)   SpO2 98%   BMI 23.99  kg/m    EXAM: General: Pt appears well and is in NAD  Eyes: External eye exam normal without stare, lid lag or exophthalmos.  EOM intact.    Neck: General: Supple without adenopathy. Thyroid: Thyroid size normal.  No goiter or nodules appreciated.   Lungs: Clear with good BS bilat with no rales, rhonchi, or wheezes  Heart: Auscultation: RRR.  Extremities:  BL LE: No pretibial edema normal ROM and strength.  Mental Status: Judgment, insight: Intact Orientation: Oriented to time, place, and person Mood and affect: No depression, anxiety, or agitation     DATA REVIEWED:   Latest Reference Range & Units 11/15/22 11:37  Sodium 135 - 145 mEq/L 138  Potassium 3.5 - 5.1 mEq/L 4.4  Chloride 96 - 112 mEq/L 103  CO2 19 - 32 mEq/L 29  Glucose 70 - 99 mg/dL 82  BUN 6 - 23 mg/dL 22  Creatinine 9.14 - 7.82 mg/dL 9.56  Calcium 8.4 - 21.3 mg/dL 08.6 (H)  Albumin 3.5 - 5.2 g/dL 4.2  GFR >57.84 mL/min 54.97 (L)     Latest Reference Range & Units 11/15/22 11:37  VITD 30.00 - 100.00 ng/mL 38.53    Latest Reference Range & Units 11/15/22 11:37  PTH, Intact 16 - 77 pg/mL 90 (H)    ASSESSMENT / PLAN / RECOMMENDATIONS:   Primary hyperparathyroidism:   - Not a surgical candidate due to advanced age  - Will treat medically when necessary  -Serum calcium slightly elevated (corrected 10.84 mg/DL) -GFR and PTH are stable  Recommendations  Stay Hydrated  Avoid over the counter calcium tablets  Maintain 2-3 servings of dietary calcium daily    2. Urine incontinence:  - We discussed the pathophysiology of incontinence - I have advised pt to avoid caffeinated beverages after noon and to limit liquid intake after 7 pm to thirst only   F/U in 1 yr     Signed electronically by: Lyndle Herrlich, MD  [facility] End[facility]  [facility] [address] [place] [place], [place] [address] Phone: [phone] FAX: [phone]      CC: Shade Flood,  MD [address] [place] [address] Phone: [phone]  Fax: [phone]   Return to Endocrinology clinic as below: Future Appointments  Date Time Provider Department Center  11/15/2022 11:30 AM Zigmond Trela, Konrad Dolores, MD [facility] None  11/30/2022  2:00 PM Carlos Levering, NP [facility] None  03/29/2023  2:20 PM Shade Flood, MD [facility]

## 2022-11-15 NOTE — Patient Instructions (Addendum)
Stay Hydrated during the day  Avoid over the counter calcium tablets  Maintain 2-3 servings of dietary calcium daily  Continue Vitamin D 1000 iu daily

## 2022-11-16 LAB — PARATHYROID HORMONE, INTACT (NO CA): PTH: 90 pg/mL — ABNORMAL HIGH (ref 16–77)

## 2022-11-24 ENCOUNTER — Ambulatory Visit: Payer: Medicare Other | Admitting: Allergy

## 2022-11-27 NOTE — Progress Notes (Unsigned)
Cardiology Clinic Note   Date: 11/30/2022 ID: Kymira, Therien 1925-07-20, MRN [id number]  Primary Cardiologist:  Kirk Ruths, MD  Patient Profile    Gina Pace is a 89 y.o. female who presents to the clinic today for 39-month follow-up.  Past medical history significant for: Hypertension. 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 Echo 08/20/2020: EF 55 to 60%.  Moderately elevated pulmonary artery systolic pressure.  Severe LAE.  Moderate MR.  Mild calcification of the aortic valve.  Moderate AI.  Dilated IVC.  RA pressure 15 mmHg. PVCs.   History of Present Illness    Gina Pace was first evaluated via virtual visit on 03/07/2019 by Jory Sims, NP for hypertension.  Patient has a history of angioedema on lisinopril.  She has been followed since by Dr. Stanford Breed for the above outlined history.  Patient was last seen in the office by Almyra Deforest, PA-C on 04/25/2022.  EKG at that visit showed improved PVCs from prior visit.  She reported cutting back on caffeine.  BP was somewhat elevated during visit and amlodipine was increased to 5 mg in the morning and 2.5 mg in the evening.  Today, patient is doing well. She denies increased palpitations. She states she will occasionally check her radial pulse to ensure she isn't having too many skipped beats. She does not check her BP at home but states it is usually normal at every other office but here. BP today is 182/71 at intake and 140/70 at recheck. She takes amlodipine 2.5 mg three times a day. No dizziness or vision changes. She does get occasional headaches that she attributes to sinus issues and allergies especially with the weather warming up. Patient denies shortness of breath or dyspnea on exertion. No chest pain, pressure, or tightness. Denies lower extremity edema, orthopnea, or PND. She does not do dedicated exercising but is independent with light and moderate housework and walks to the dumpster at condo complex to take out her  trash.    ROS: All other systems reviewed and are otherwise negative except as noted in History of Present Illness.  Studies Reviewed    ECG no ordered today.   Risk Assessment/Calculations      HYPERTENSION CONTROL Vitals:   11/30/22 1356 11/30/22 1429  BP: (!) 182/71 (!) 140/70    The patient's blood pressure is elevated above target today.  In order to address the patient's elevated BP: Blood pressure will be monitored at home to determine if medication changes need to be made.           Physical Exam    VS:  BP (!) 182/71 (BP Location: Left Arm, Patient Position: Sitting, Cuff Size: Normal)   Pulse 74   Ht 4\' 10"  (1.473 m)   Wt 114 lb (51.7 kg)   BMI 23.83 kg/m  , BMI Body mass index is 23.83 kg/m.  GEN: Well nourished, well developed, in no acute distress. Neck: No JVD or carotid bruits. Cardiac:  RRR. Occasional ectopy.  No murmurs. No rubs or gallops.   Respiratory:  Respirations regular and unlabored. Clear to auscultation without rales, wheezing or rhonchi. GI: Soft, nontender, nondistended. Extremities: Radials/DP/PT 2+ and equal bilaterally. No clubbing or cyanosis. No edema.  Skin: Warm and dry, no rash. Neuro: Strength intact.  Assessment & Plan   Hypertension.  BP today 182/71 at intake and 140/70 at recheck.  Patient denies dizziness or vision changes.  Continue amlodipine. PVCs.  Patient denies increased palpitations. She feels she has done very  well since last being seen. Continue to monitor. Patient is instructed to call if she feels palpitations are more frequent.  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  Echo December 2021 showed EF 55 to 60%, moderate AI/MR.  Patient denies DOE, lower extremity edema, orthopnea or PND. No clinical indication to repeat echo at this point. Patient is in agreement.   Disposition: Return in 6 months or sooner as needed.          Signed, Justice Britain. Erdem Naas, DNP, NP-C

## 2022-11-30 ENCOUNTER — Encounter: Payer: Self-pay | Admitting: Student

## 2022-11-30 ENCOUNTER — Ambulatory Visit: Payer: Medicare Other | Attending: Physician Assistant | Admitting: Student

## 2022-11-30 VITALS — BP 140/70 | HR 74 | Ht <= 58 in | Wt 114.0 lb

## 2022-11-30 DIAGNOSIS — I351 Nonrheumatic aortic (valve) insufficiency: Secondary | ICD-10-CM | POA: Diagnosis not present

## 2022-11-30 DIAGNOSIS — I10 Essential (primary) hypertension: Secondary | ICD-10-CM

## 2022-11-30 DIAGNOSIS — R002 Palpitations: Secondary | ICD-10-CM | POA: Diagnosis not present

## 2022-11-30 DIAGNOSIS — I340 Nonrheumatic mitral (valve) insufficiency: Secondary | ICD-10-CM | POA: Diagnosis not present

## 2022-11-30 NOTE — Patient Instructions (Signed)
Medication Instructions:  Your physician recommends that you continue on your current medications as directed. Please refer to the Current Medication list given to you today.  *If you need a refill on your cardiac medications before your next appointment, please call your pharmacy*   Lab Work: NONE If you have labs (blood work) drawn today and your tests are completely normal, you will receive your results only by: [place] (if you have MyChart) OR A paper copy in the mail If you have any lab test that is abnormal or we need to change your treatment, we will call you to review the results.   Testing/Procedures: NONE   Follow-Up: At [facility], you and your health needs are our priority.  As part of our continuing mission to provide you with exceptional heart care, we have created designated Provider Care Teams.  These Care Teams include your primary Cardiologist (physician) and Advanced Practice Providers (APPs -  Physician Assistants and Nurse Practitioners) who all work together to provide you with the care you need, when you need it.  We recommend signing up for the patient portal called "MyChart".  Sign up information is provided on this After Visit Summary.  MyChart is used to connect with patients for Virtual Visits (Telemedicine).  Patients are able to view lab/test results, encounter notes, upcoming appointments, etc.  Non-urgent messages can be sent to your provider as well.   To learn more about what you can do with MyChart, go to [email or web address].    Your next appointment:   6 month(s)  Provider:   Kirk Ruths, MD

## 2022-12-12 DIAGNOSIS — H353211 Exudative age-related macular degeneration, right eye, with active choroidal neovascularization: Secondary | ICD-10-CM | POA: Diagnosis not present

## 2022-12-16 ENCOUNTER — Other Ambulatory Visit: Payer: Self-pay | Admitting: Family Medicine

## 2022-12-16 DIAGNOSIS — I10 Essential (primary) hypertension: Secondary | ICD-10-CM

## 2023-01-16 DIAGNOSIS — H353211 Exudative age-related macular degeneration, right eye, with active choroidal neovascularization: Secondary | ICD-10-CM | POA: Diagnosis not present

## 2023-02-13 DIAGNOSIS — H353122 Nonexudative age-related macular degeneration, left eye, intermediate dry stage: Secondary | ICD-10-CM | POA: Diagnosis not present

## 2023-02-13 DIAGNOSIS — H35033 Hypertensive retinopathy, bilateral: Secondary | ICD-10-CM | POA: Diagnosis not present

## 2023-02-13 DIAGNOSIS — H43813 Vitreous degeneration, bilateral: Secondary | ICD-10-CM | POA: Diagnosis not present

## 2023-02-13 DIAGNOSIS — H353211 Exudative age-related macular degeneration, right eye, with active choroidal neovascularization: Secondary | ICD-10-CM | POA: Diagnosis not present

## 2023-03-22 DIAGNOSIS — H353211 Exudative age-related macular degeneration, right eye, with active choroidal neovascularization: Secondary | ICD-10-CM | POA: Diagnosis not present

## 2023-03-29 ENCOUNTER — Ambulatory Visit: Payer: Medicare Other | Admitting: Family Medicine

## 2023-04-05 ENCOUNTER — Encounter (INDEPENDENT_AMBULATORY_CARE_PROVIDER_SITE_OTHER): Payer: Self-pay

## 2023-04-20 ENCOUNTER — Other Ambulatory Visit: Payer: Medicare Other

## 2023-04-20 ENCOUNTER — Encounter: Payer: Self-pay | Admitting: Family Medicine

## 2023-04-20 ENCOUNTER — Encounter: Payer: Medicare Other | Admitting: Family Medicine

## 2023-04-20 ENCOUNTER — Ambulatory Visit (INDEPENDENT_AMBULATORY_CARE_PROVIDER_SITE_OTHER): Payer: Medicare Other | Admitting: Family Medicine

## 2023-04-20 ENCOUNTER — Telehealth: Payer: Self-pay | Admitting: Family Medicine

## 2023-04-20 VITALS — BP 138/76 | HR 70 | Temp 97.9°F | Ht <= 58 in | Wt 111.2 lb

## 2023-04-20 DIAGNOSIS — I493 Ventricular premature depolarization: Secondary | ICD-10-CM

## 2023-04-20 DIAGNOSIS — R0981 Nasal congestion: Secondary | ICD-10-CM | POA: Diagnosis not present

## 2023-04-20 DIAGNOSIS — I10 Essential (primary) hypertension: Secondary | ICD-10-CM | POA: Diagnosis not present

## 2023-04-20 MED ORDER — AMLODIPINE BESYLATE 2.5 MG PO TABS: 2.5000 mg | ORAL_TABLET | Freq: Three times a day (TID) | ORAL | 1 refills | Status: DC

## 2023-04-20 NOTE — Telephone Encounter (Signed)
She is here now stating she got lost and has to get a ride and would like to speak to you.  MRN: [id number] was called VM picked up - marked NO SHOW

## 2023-04-20 NOTE — Patient Instructions (Addendum)
No change in blood pressure medicine for now, make sure to stay hydrated during the day but try to avoid any caffeinated beverages in the afternoon or evening and avoid significant fluid within an hour to going to bed.  If any return of lightheadedness in the morning, check blood pressure at that time and follow-up with me to discuss possible medication changes.  Okay to try the saline nasal spray for nasal congestion throughout the day.  Continue Allegra same dose for now.  Follow-up if any new or worsening symptoms.  Take care.

## 2023-04-20 NOTE — Progress Notes (Signed)
Subjective:  Patient ID: Gina Pace, female    DOB: 15-Nov-1924  Age: 89 y.o. MRN: [id number]  CC:  Chief Complaint  Patient presents with   Medical Management of Chronic Issues    Pt doing well, no concerns     HPI Gina Pace presents for    Chronic nasal congestion Treated with Allegra  1/2 dose p.o. twice daily, Pepcid twice daily which was also help tongue swelling in the past.  No recent falre of tongue swelling - minimal in am and resolves with allegra, no trouble swallowing.  Atrovent nasal spray has caused too much drying in the past, intermittent dosing planned in past. No recent use. Still some PND. Did not tolerate  steroid nasal spray. Too drying, headache. Uses topical vicks vaporub - helps when needed.  Saline NS - just purchased - will try.  Has allergist.   Hypertension: With history of PVCs, history of ventricular bigeminy improved with decrease caffeine.  Ultimately has found that 2.5 mg amlodipine 3 times daily dosing has been best tolerated. Still working well, no chest pain or palpitations.  No recent lightheadedness.  Urinates at night, sometimes lightheaded in morning - not feeling this recently.  Some fluids in evening, caffeine in afternoon/evening. Home readings:none BP Readings from Last 3 Encounters:  04/20/23 138/76  11/30/22 (!) 140/70  11/15/22 122/76   Lab Results  Component Value Date   CREATININE 0.89 11/15/2022   Hyperparathyroidism: Followed by endocrinology - Dr. Lonzo Cloud. Appt in 11/15/22 - 1 year follow up. Medical treatment. Stable PTH.   History Patient Active Problem List   Diagnosis Date Noted   Hypercalcemia 04/19/2021   Closed fracture of proximal end of left humerus 02/19/2020   Pain in joint of left shoulder 01/22/2020   Essential hypertension 02/14/2019   Dizziness and giddiness 02/14/2019   Past Medical History:  Diagnosis Date   Allergy    Cataract    GI bleed    Hypertension 02/2019   Newly  diagnosed    Osteoporosis    Tuberculosis    Ulcer of gastric fundus, acute    Past Surgical History:  Procedure Laterality Date   ABDOMINAL HYSTERECTOMY     APPENDECTOMY     Bladder     Bladder tack   EYE SURGERY     Rectum     Rectal tear repair   Allergies  Allergen Reactions   Chocolate Anaphylaxis    Migraines    Prednisone Hypertension   Shellfish Allergy    Chocolate Flavor    Clindamycin    Clindamycin/Lincomycin    Codeine    Erythromycin    Erythromycin Base    Keflex [Cephalexin]    Lisinopril     Angioedema    Other    Penicillins    Sulfa Antibiotics    Prior to Admission medications   Medication Sig Start Date End Date Taking? Authorizing Provider  Acetaminophen 325 MG CAPS Take 325 mg by mouth daily as needed.   Yes [provider]  amLODipine (NORVASC) 2.5 MG tablet TAKE 1 TABLET (2.5 MG TOTAL) BY MOUTH IN THE MORNING, AT NOON, AND AT BEDTIME. 12/16/22  Yes Shade Flood, MD  famotidine (PEPCID) 20 MG tablet Take 1/2 tablet twice daily. 07/07/22  Yes Padgett, Pilar Grammes, MD  fexofenadine (ALLEGRA) 60 MG tablet Take 60 mg by mouth daily. Take 1/2 tablet twice daily   Yes [provider]  ipratropium (ATROVENT) 0.06 % nasal spray Place 1-2 sprays  into both nostrils in the morning and at bedtime. As needed for nasal congestion 06/02/22  Yes Shade Flood, MD   Social History   Socioeconomic History   Marital status: Widowed    Spouse name: Not on file   Number of children: 3   Years of education: Not on file   Highest education level: Not on file  Occupational History   Occupation: Book Biomedical engineer    Comment: Retired    Occupation: Arboriculturist     Comment: Retired   Tobacco Use   Smoking status: Never   Smokeless tobacco: Never  Vaping Use   Vaping status: Never Used  Substance and Sexual Activity   Alcohol use: Yes    Comment: 1 Glass of Scotch a Night   Drug use: Never   Sexual activity: Not Currently   Other Topics Concern   Not on file  Social History Narrative   Has 3 children 2 daughters and a son, who are very attentive.    Lives alone in a townhouse.    Able to perform all ADL's   Exercises daily   Belongs to social clubs   Does line dancing.    Social Determinants of Health   Financial Resource Strain: Low Risk  (08/17/2022)   Overall Financial Resource Strain (CARDIA)    Difficulty of Paying Living Expenses: Not hard at all  Food Insecurity: No Food Insecurity (08/17/2022)   Hunger Vital Sign    Worried About Running Out of Food in the Last Year: Never true    Ran Out of Food in the Last Year: Never true  Transportation Needs: No Transportation Needs (08/17/2022)   PRAPARE - Administrator, Civil Service (Medical): No    Lack of Transportation (Non-Medical): No  Physical Activity: Inactive (08/17/2022)   Exercise Vital Sign    Days of Exercise per Week: 0 days    Minutes of Exercise per Session: 0 min  Stress: No Stress Concern Present (08/17/2022)   [employer] of Occupational Health - Occupational Stress Questionnaire    Feeling of Stress : Not at all  Social Connections: Moderately Integrated (08/17/2022)   Social Connection and Isolation Panel [NHANES]    Frequency of Communication with Friends and Family: More than three times a week    Frequency of Social Gatherings with Friends and Family: Twice a week    Attends Religious Services: More than 4 times per year    Active Member of [employer] or Organizations: Yes    Attends Banker Meetings: More than 4 times per year    Marital Status: Widowed  Intimate Partner Violence: Not At Risk (08/17/2022)   Humiliation, Afraid, Rape, and Kick questionnaire    Fear of Current or Ex-Partner: No    Emotionally Abused: No    Physically Abused: No    Sexually Abused: No    Review of Systems Pre HPI  Objective:   Vitals:   04/20/23 1418  BP: 138/76  Pulse: 70  Temp: 97.9 F (36.6 C)   TempSrc: Temporal  SpO2: 98%  Weight: 111 lb 3.2 oz (50.4 kg)  Height: 4\' 10"  (1.473 m)     Physical Exam Vitals reviewed.  Constitutional:      Appearance: Normal appearance. She is well-developed.  HENT:     Head: Normocephalic and atraumatic.     Nose: No congestion or rhinorrhea.  Eyes:     Conjunctiva/sclera: Conjunctivae normal.     Pupils: Pupils are equal, round,  and reactive to light.  Neck:     Vascular: No carotid bruit.  Cardiovascular:     Rate and Rhythm: Normal rate and regular rhythm.     Heart sounds: Normal heart sounds.  Pulmonary:     Effort: Pulmonary effort is normal.     Breath sounds: Normal breath sounds.  Abdominal:     Palpations: Abdomen is soft. There is no pulsatile mass.     Tenderness: There is no abdominal tenderness.  Musculoskeletal:     Right lower leg: No edema.     Left lower leg: No edema.  Skin:    General: Skin is warm and dry.  Neurological:     Mental Status: She is alert and oriented to person, place, and time.  Psychiatric:        Mood and Affect: Mood normal.        Behavior: Behavior normal.      Assessment & Plan:  Gina Pace is a 89 y.o. female . PVC's (premature ventricular contractions) Essential hypertension - Plan: amLODipine (NORVASC) 2.5 MG tablet, Basic metabolic panel  - stable with current regimen. Continue same. If any return of dizziness in am, check BP and recheck to discuss meds and possible changes in frequency/doses. Avoidance of caffeine in evening and fluids just before bedtime.   Nasal congestion  - longstanding issue without recent changes.  saline NS trial, no change in allegra for now.  Intolerant to other meds prior. Option of allergist follow up if needed.   Meds ordered this encounter  Medications   amLODipine (NORVASC) 2.5 MG tablet    Sig: Take 1 tablet (2.5 mg total) by mouth in the morning, at noon, and at bedtime.    Dispense:  270 tablet    Refill:  1   Patient  Instructions  No change in blood pressure medicine for now, make sure to stay hydrated during the day but try to avoid any caffeinated beverages in the afternoon or evening and avoid significant fluid within an hour to going to bed.  If any return of lightheadedness in the morning, check blood pressure at that time and follow-up with me to discuss possible medication changes.  Okay to try the saline nasal spray for nasal congestion throughout the day.  Continue Allegra same dose for now.  Follow-up if any new or worsening symptoms.  Take care.     Signed,   Meredith Staggers, MD [facility], [facility] 04/20/23 2:25 PM

## 2023-04-20 NOTE — Telephone Encounter (Signed)
Made some changes to the appointment today due to another patient not showing and so we moved her into the slot

## 2023-04-21 ENCOUNTER — Telehealth: Payer: Self-pay

## 2023-04-21 ENCOUNTER — Other Ambulatory Visit: Payer: Self-pay

## 2023-04-21 DIAGNOSIS — E871 Hypo-osmolality and hyponatremia: Secondary | ICD-10-CM

## 2023-04-21 LAB — BASIC METABOLIC PANEL
BUN: 19 mg/dL (ref 6–23)
CO2: 27 meq/L (ref 19–32)
Calcium: 11.4 mg/dL — ABNORMAL HIGH (ref 8.4–10.5)
Chloride: 97 meq/L (ref 96–112)
Creatinine, Ser: 0.88 mg/dL (ref 0.40–1.20)
GFR: 55.55 mL/min — ABNORMAL LOW (ref >60.00)
Glucose, Bld: 77 mg/dL (ref 70–99)
Potassium: 4.3 meq/L (ref 3.5–5.1)
Sodium: 130 meq/L — ABNORMAL LOW (ref 135–145)

## 2023-04-21 NOTE — Telephone Encounter (Signed)
-----   Message from Shade Flood sent at 04/21/2023  2:38 PM EDT ----- Sodium level was lower than usual.  Other electrolytes were similar.  Please have her return for lab only visit on Monday if possible.  If any new symptoms over the weekend should be seen through urgent care or ER.  I would not expect that with a sodium level 130.  Please order BMP for hyponatremia.  Thanks.

## 2023-04-24 ENCOUNTER — Other Ambulatory Visit: Payer: Medicare Other

## 2023-04-25 ENCOUNTER — Other Ambulatory Visit (INDEPENDENT_AMBULATORY_CARE_PROVIDER_SITE_OTHER): Payer: Medicare Other

## 2023-04-25 ENCOUNTER — Other Ambulatory Visit: Payer: Self-pay

## 2023-04-25 DIAGNOSIS — E871 Hypo-osmolality and hyponatremia: Secondary | ICD-10-CM

## 2023-04-25 LAB — BASIC METABOLIC PANEL
BUN: 17 mg/dL (ref 6–23)
CO2: 27 mEq/L (ref 19–32)
Calcium: 10.6 mg/dL — ABNORMAL HIGH (ref 8.4–10.5)
Chloride: 102 mEq/L (ref 96–112)
Creatinine, Ser: 0.87 mg/dL (ref 0.40–1.20)
GFR: 56.31 mL/min — ABNORMAL LOW (ref >60.00)
Glucose, Bld: 101 mg/dL — ABNORMAL HIGH (ref 70–99)
Potassium: 4.3 mEq/L (ref 3.5–5.1)
Sodium: 136 mEq/L (ref 135–145)

## 2023-04-25 NOTE — Progress Notes (Signed)
Lab called and BMP had to be changed from active to future in order to be collected at [facility]

## 2023-04-27 ENCOUNTER — Telehealth: Payer: Self-pay

## 2023-04-27 NOTE — Telephone Encounter (Signed)
-----   Message from Shade Flood sent at 04/26/2023  6:28 PM EDT ----- Good news, repeat sodium level was normal.  Blood sugar only borderline elevated but not concerning, calcium level improved, only borderline at this time.  Other labs stable.

## 2023-04-28 NOTE — Progress Notes (Signed)
This encounter was created in error - please disregard.

## 2023-05-01 DIAGNOSIS — H353211 Exudative age-related macular degeneration, right eye, with active choroidal neovascularization: Secondary | ICD-10-CM | POA: Diagnosis not present

## 2023-05-10 NOTE — Progress Notes (Signed)
HPI: FU hypertension.  Patient does have a history of angioedema with lisinopril.  Echocardiogram December 2021 showed normal LV function, severe left atrial enlargement, moderate mitral regurgitation, moderate tricuspid regurgitation, moderate aortic insufficiency.  Also with history of PVCs.  Since last seen, she denies dyspnea, chest pain, palpitations, syncope or pedal edema.  Current Outpatient Medications  Medication Sig Dispense Refill   Acetaminophen 325 MG CAPS Take 325 mg by mouth daily as needed.     amLODipine (NORVASC) 2.5 MG tablet Take 1 tablet (2.5 mg total) by mouth in the morning, at noon, and at bedtime. 270 tablet 1   fexofenadine (ALLEGRA) 60 MG tablet Take 60 mg by mouth daily. Take 1/2 tablet twice daily     famotidine (PEPCID) 20 MG tablet Take 1/2 tablet twice daily. (Patient not taking: Reported on 05/16/2023) 30 tablet 5   ipratropium (ATROVENT) 0.06 % nasal spray Place 1-2 sprays into both nostrils in the morning and at bedtime. As needed for nasal congestion (Patient not taking: Reported on 05/16/2023) 15 mL 5   No current facility-administered medications for this visit.     Past Medical History:  Diagnosis Date   Allergy    Cataract    GI bleed    Hypertension 02/2019   Newly diagnosed    Osteoporosis    Tuberculosis    Ulcer of gastric fundus, acute     Past Surgical History:  Procedure Laterality Date   ABDOMINAL HYSTERECTOMY     APPENDECTOMY     Bladder     Bladder tack   EYE SURGERY     Rectum     Rectal tear repair    Social History   Socioeconomic History   Marital status: Widowed    Spouse name: Not on file   Number of children: 3   Years of education: Not on file   Highest education level: Not on file  Occupational History   Occupation: Book Biomedical engineer    Comment: Retired    Occupation: Arboriculturist     Comment: Retired   Tobacco Use   Smoking status: Never   Smokeless tobacco: Never  Vaping Use   Vaping status:  Never Used  Substance and Sexual Activity   Alcohol use: Yes    Comment: 1 Glass of Scotch a Night   Drug use: Never   Sexual activity: Not Currently  Other Topics Concern   Not on file  Social History Narrative   Has 3 children 2 daughters and a son, who are very attentive.    Lives alone in a townhouse.    Able to perform all ADL's   Exercises daily   Belongs to social clubs   Does line dancing.    Social Determinants of Health   Financial Resource Strain: Low Risk  (08/17/2022)   Overall Financial Resource Strain (CARDIA)    Difficulty of Paying Living Expenses: Not hard at all  Food Insecurity: No Food Insecurity (08/17/2022)   Hunger Vital Sign    Worried About Running Out of Food in the Last Year: Never true    Ran Out of Food in the Last Year: Never true  Transportation Needs: No Transportation Needs (08/17/2022)   PRAPARE - Administrator, Civil Service (Medical): No    Lack of Transportation (Non-Medical): No  Physical Activity: Inactive (08/17/2022)   Exercise Vital Sign    Days of Exercise per Week: 0 days    Minutes of Exercise per Session: 0  min  Stress: No Stress Concern Present (08/17/2022)   [employer] of Occupational Health - Occupational Stress Questionnaire    Feeling of Stress : Not at all  Social Connections: Moderately Integrated (08/17/2022)   Social Connection and Isolation Panel [NHANES]    Frequency of Communication with Friends and Family: More than three times a week    Frequency of Social Gatherings with Friends and Family: Twice a week    Attends Religious Services: More than 4 times per year    Active Member of [employer] or Organizations: Yes    Attends Banker Meetings: More than 4 times per year    Marital Status: Widowed  Intimate Partner Violence: Not At Risk (08/17/2022)   Humiliation, Afraid, Rape, and Kick questionnaire    Fear of Current or Ex-Partner: No    Emotionally Abused: No    Physically Abused:  No    Sexually Abused: No    Family History  Problem Relation Age of Onset   CAD Mother 73   Heart attack Mother    CAD Father 80   Heart attack Father    Congestive Heart Failure Sister    CAD Sister        Hx of CABG   Hypercalcemia Sister    Emphysema Brother     ROS: no fevers or chills, productive cough, hemoptysis, dysphasia, odynophagia, melena, hematochezia, dysuria, hematuria, rash, seizure activity, orthopnea, PND, pedal edema, claudication. Remaining systems are negative.  Physical Exam: Well-developed well-nourished in no acute distress.  Skin is warm and dry.  HEENT is normal.  Neck is supple.  Chest is clear to auscultation with normal expansion.  Cardiovascular exam is regular rate and rhythm.  Abdominal exam nontender or distended. No masses palpated. Extremities show no edema. neuro grossly intact  Electrocardiogram shows sinus rhythm with PACs, first-degree AV block, left anterior fasicular block, right bundle branch block, personally reviewed.   A/P  1 valvular heart disease-most recent echocardiogram showed moderate mitral, aortic and tricuspid regurgitation.  She is not having symptoms.  I will repeat echocardiogram to make sure LV function is not deteriorating as we would need to adjust medications.  Given her age I do not think she would be a candidate for any procedures concerning her heart valves.  2 hypertension-blood pressure controlled.  Continue present medications.  3 history of PVCs-no symptoms of palpitations and previous echo with normal LV function.   Olga Millers, MD

## 2023-05-16 ENCOUNTER — Ambulatory Visit: Payer: Medicare Other | Attending: Cardiology | Admitting: Cardiology

## 2023-05-16 ENCOUNTER — Encounter: Payer: Self-pay | Admitting: Cardiology

## 2023-05-16 VITALS — BP 112/76 | HR 78 | Ht 60.0 in | Wt 110.0 lb

## 2023-05-16 DIAGNOSIS — I340 Nonrheumatic mitral (valve) insufficiency: Secondary | ICD-10-CM | POA: Diagnosis not present

## 2023-05-16 DIAGNOSIS — I10 Essential (primary) hypertension: Secondary | ICD-10-CM | POA: Diagnosis not present

## 2023-05-16 DIAGNOSIS — I351 Nonrheumatic aortic (valve) insufficiency: Secondary | ICD-10-CM

## 2023-05-29 DIAGNOSIS — H353231 Exudative age-related macular degeneration, bilateral, with active choroidal neovascularization: Secondary | ICD-10-CM | POA: Diagnosis not present

## 2023-05-29 DIAGNOSIS — H43813 Vitreous degeneration, bilateral: Secondary | ICD-10-CM | POA: Diagnosis not present

## 2023-05-29 DIAGNOSIS — H353211 Exudative age-related macular degeneration, right eye, with active choroidal neovascularization: Secondary | ICD-10-CM | POA: Diagnosis not present

## 2023-05-29 DIAGNOSIS — H35033 Hypertensive retinopathy, bilateral: Secondary | ICD-10-CM | POA: Diagnosis not present

## 2023-05-29 DIAGNOSIS — H353221 Exudative age-related macular degeneration, left eye, with active choroidal neovascularization: Secondary | ICD-10-CM | POA: Diagnosis not present

## 2023-05-31 ENCOUNTER — Other Ambulatory Visit (HOSPITAL_COMMUNITY): Payer: Medicare Other

## 2023-06-13 ENCOUNTER — Ambulatory Visit (HOSPITAL_COMMUNITY): Payer: Medicare Other | Attending: Cardiology

## 2023-06-13 DIAGNOSIS — I340 Nonrheumatic mitral (valve) insufficiency: Secondary | ICD-10-CM | POA: Diagnosis not present

## 2023-06-13 DIAGNOSIS — I351 Nonrheumatic aortic (valve) insufficiency: Secondary | ICD-10-CM | POA: Insufficient documentation

## 2023-06-13 LAB — ECHOCARDIOGRAM COMPLETE
Area-P 1/2: 4.44 cm2
MV M vel: 5.5 m/s
MV Peak grad: 121 mm[Hg]
P 1/2 time: 421 ms
Radius: 0.8 cm
S' Lateral: 2.2 cm

## 2023-06-21 ENCOUNTER — Encounter: Payer: Self-pay | Admitting: *Deleted

## 2023-06-22 ENCOUNTER — Ambulatory Visit: Payer: Medicare Other | Admitting: *Deleted

## 2023-06-22 DIAGNOSIS — Z0000 Encounter for general adult medical examination without abnormal findings: Secondary | ICD-10-CM | POA: Diagnosis not present

## 2023-06-22 NOTE — Patient Instructions (Signed)
Gina Pace , Thank you for taking time to come for your Medicare Wellness Visit. I appreciate your ongoing commitment to your health goals. Please review the following plan we discussed and let me know if I can assist you in the future.   Screening recommendations/referrals: Colonoscopy: no longer required Mammogram: no longer required Bone Density: up to date Recommended yearly ophthalmology/optometry visit for glaucoma screening and checkup Recommended yearly dental visit for hygiene and checkup  Vaccinations: Influenza vaccine: Education provided Pneumococcal vaccine: Education provided Tdap vaccine: Education provided Shingles vaccine: Education provided    Advanced directives: yes not on file    Preventive Care 65 Years and Older, Female Preventive care refers to lifestyle choices and visits with your health care provider that can promote health and wellness. What does preventive care include? A yearly physical exam. This is also called an annual well check. Dental exams once or twice a year. Routine eye exams. Ask your health care provider how often you should have your eyes checked. Personal lifestyle choices, including: Daily care of your teeth and gums. Regular physical activity. Eating a healthy diet. Avoiding tobacco and drug use. Limiting alcohol use. Practicing safe sex. Taking low-dose aspirin every day. Taking vitamin and mineral supplements as recommended by your health care provider. What happens during an annual well check? The services and screenings done by your health care provider during your annual well check will depend on your age, overall health, lifestyle risk factors, and family history of disease. Counseling  Your health care provider may ask you questions about your: Alcohol use. Tobacco use. Drug use. Emotional well-being. Home and relationship well-being. Sexual activity. Eating habits. History of falls. Memory and ability to  understand (cognition). Work and work Astronomer. Reproductive health. Screening  You may have the following tests or measurements: Height, weight, and BMI. Blood pressure. Lipid and cholesterol levels. These may be checked every 5 years, or more frequently if you are over 78 years old. Skin check. Lung cancer screening. You may have this screening every year starting at age 65 if you have a 30-pack-year history of smoking and currently smoke or have quit within the past 15 years. Fecal occult blood test (FOBT) of the stool. You may have this test every year starting at age 37. Flexible sigmoidoscopy or colonoscopy. You may have a sigmoidoscopy every 5 years or a colonoscopy every 10 years starting at age 81. Hepatitis C blood test. Hepatitis B blood test. Sexually transmitted disease (STD) testing. Diabetes screening. This is done by checking your blood sugar (glucose) after you have not eaten for a while (fasting). You may have this done every 1-3 years. Bone density scan. This is done to screen for osteoporosis. You may have this done starting at age 38. Mammogram. This may be done every 1-2 years. Talk to your health care provider about how often you should have regular mammograms. Talk with your health care provider about your test results, treatment options, and if necessary, the need for more tests. Vaccines  Your health care provider may recommend certain vaccines, such as: Influenza vaccine. This is recommended every year. Tetanus, diphtheria, and acellular pertussis (Tdap, Td) vaccine. You may need a Td booster every 10 years. Zoster vaccine. You may need this after age 54. Pneumococcal 13-valent conjugate (PCV13) vaccine. One dose is recommended after age 27. Pneumococcal polysaccharide (PPSV23) vaccine. One dose is recommended after age 6. Talk to your health care provider about which screenings and vaccines you need and how often you  need them. This information is not  intended to replace advice given to you by your health care provider. Make sure you discuss any questions you have with your health care provider. Document Released: 09/18/2015 Document Revised: 05/11/2016 Document Reviewed: 06/23/2015 Elsevier Interactive Patient Education  2017 [employer].  Fall Prevention in the Home Falls can cause injuries. They can happen to people of all ages. There are many things you can do to make your home safe and to help prevent falls. What can I do on the outside of my home? Regularly fix the edges of walkways and driveways and fix any cracks. Remove anything that might make you trip as you walk through a door, such as a raised step or threshold. Trim any bushes or trees on the path to your home. Use bright outdoor lighting. Clear any walking paths of anything that might make someone trip, such as rocks or tools. Regularly check to see if handrails are loose or broken. Make sure that both sides of any steps have handrails. Any raised decks and porches should have guardrails on the edges. Have any leaves, snow, or ice cleared regularly. Use sand or salt on walking paths during winter. Clean up any spills in your garage right away. This includes oil or grease spills. What can I do in the bathroom? Use night lights. Install grab bars by the toilet and in the tub and shower. Do not use towel bars as grab bars. Use non-skid mats or decals in the tub or shower. If you need to sit down in the shower, use a plastic, non-slip stool. Keep the floor dry. Clean up any water that spills on the floor as soon as it happens. Remove soap buildup in the tub or shower regularly. Attach bath mats securely with double-sided non-slip rug tape. Do not have throw rugs and other things on the floor that can make you trip. What can I do in the bedroom? Use night lights. Make sure that you have a light by your bed that is easy to reach. Do not use any sheets or blankets that are  too big for your bed. They should not hang down onto the floor. Have a firm chair that has side arms. You can use this for support while you get dressed. Do not have throw rugs and other things on the floor that can make you trip. What can I do in the kitchen? Clean up any spills right away. Avoid walking on wet floors. Keep items that you use a lot in easy-to-reach places. If you need to reach something above you, use a strong step stool that has a grab bar. Keep electrical cords out of the way. Do not use floor polish or wax that makes floors slippery. If you must use wax, use non-skid floor wax. Do not have throw rugs and other things on the floor that can make you trip. What can I do with my stairs? Do not leave any items on the stairs. Make sure that there are handrails on both sides of the stairs and use them. Fix handrails that are broken or loose. Make sure that handrails are as long as the stairways. Check any carpeting to make sure that it is firmly attached to the stairs. Fix any carpet that is loose or worn. Avoid having throw rugs at the top or bottom of the stairs. If you do have throw rugs, attach them to the floor with carpet tape. Make sure that you have a light switch  at the top of the stairs and the bottom of the stairs. If you do not have them, ask someone to add them for you. What else can I do to help prevent falls? Wear shoes that: Do not have high heels. Have rubber bottoms. Are comfortable and fit you well. Are closed at the toe. Do not wear sandals. If you use a stepladder: Make sure that it is fully opened. Do not climb a closed stepladder. Make sure that both sides of the stepladder are locked into place. Ask someone to hold it for you, if possible. Clearly mark and make sure that you can see: Any grab bars or handrails. First and last steps. Where the edge of each step is. Use tools that help you move around (mobility aids) if they are needed. These  include: Canes. Walkers. Scooters. Crutches. Turn on the lights when you go into a dark area. Replace any light bulbs as soon as they burn out. Set up your furniture so you have a clear path. Avoid moving your furniture around. If any of your floors are uneven, fix them. If there are any pets around you, be aware of where they are. Review your medicines with your doctor. Some medicines can make you feel dizzy. This can increase your chance of falling. Ask your doctor what other things that you can do to help prevent falls. This information is not intended to replace advice given to you by your health care provider. Make sure you discuss any questions you have with your health care provider. Document Released: 06/18/2009 Document Revised: 01/28/2016 Document Reviewed: 09/26/2014 Elsevier Interactive Patient Education  2017 [employer].

## 2023-06-22 NOTE — Progress Notes (Signed)
Subjective:   Gina Pace is a 89 y.o. female who presents for Medicare Annual (Subsequent) preventive examination.  Visit Complete: Virtual I connected with  Gina Pace on 06/22/23 by a audio enabled telemedicine application and verified that I am speaking with the correct person using two identifiers.  Patient Location: Home  Provider Location: Home Office  I discussed the limitations of evaluation and management by telemedicine. The patient expressed understanding and agreed to proceed.  Vital Signs: Because this visit was a virtual/telehealth visit, some criteria may be missing or patient reported. Any vitals not documented were not able to be obtained and vitals that have been documented are patient reported.      Objective:    There were no vitals filed for this visit. There is no height or weight on file to calculate BMI.     06/22/2023   11:18 AM 08/17/2022   11:35 AM 06/03/2022   11:18 AM 07/22/2021   11:35 AM 11/20/2019    1:12 PM 03/02/2019    6:46 PM 02/27/2019    9:21 PM  Advanced Directives  Does Patient Have a Medical Advance Directive? Yes Yes Yes Yes Yes No No  Type of Sales promotion account executive of [employer] Power of [place];Living will Healthcare Power of [place];Living will    Does patient want to make changes to medical advance directive?     No - Patient declined    Copy of Healthcare Power of Attorney in Chart? No - copy requested Yes - validated most recent copy scanned in chart (See row information)  No - copy requested No - copy requested    Would patient like information on creating a medical advance directive?      No - Patient declined No - Patient declined    Current Medications (verified) Outpatient Encounter Medications as of 06/22/2023  Medication Sig   Acetaminophen 325 MG CAPS Take 325 mg by mouth daily as needed.   amLODipine (NORVASC) 2.5 MG tablet Take 1 tablet (2.5 mg  total) by mouth in the morning, at noon, and at bedtime.   fexofenadine (ALLEGRA) 60 MG tablet Take 60 mg by mouth daily. Take 1/2 tablet twice daily   ipratropium (ATROVENT) 0.06 % nasal spray Place 1-2 sprays into both nostrils in the morning and at bedtime. As needed for nasal congestion   famotidine (PEPCID) 20 MG tablet Take 1/2 tablet twice daily. (Patient not taking: Reported on 05/16/2023)   No facility-administered encounter medications on file as of 06/22/2023.    Allergies (verified) Chocolate, Prednisone, Shellfish allergy, Chocolate flavor, Clindamycin, Clindamycin/lincomycin, Codeine, Erythromycin, Erythromycin base, Keflex [cephalexin], Lisinopril, Other, Penicillins, and Sulfa antibiotics   History: Past Medical History:  Diagnosis Date   Allergy    Cataract    GI bleed    Hypertension 02/2019   Newly diagnosed    Osteoporosis    Tuberculosis    Ulcer of gastric fundus, acute    Past Surgical History:  Procedure Laterality Date   ABDOMINAL HYSTERECTOMY     APPENDECTOMY     Bladder     Bladder tack   EYE SURGERY     Rectum     Rectal tear repair   Family History  Problem Relation Age of Onset   CAD Mother 102   Heart attack Mother    CAD Father 54   Heart attack Father    Congestive Heart Failure Sister    CAD Sister  Hx of CABG   Hypercalcemia Sister    Emphysema Brother    Social History   Socioeconomic History   Marital status: Widowed    Spouse name: Not on file   Number of children: 3   Years of education: Not on file   Highest education level: Not on file  Occupational History   Occupation: Book Biomedical engineer    Comment: Retired    Occupation: Arboriculturist     Comment: Retired   Tobacco Use   Smoking status: Never   Smokeless tobacco: Never  Vaping Use   Vaping status: Never Used  Substance and Sexual Activity   Alcohol use: Yes    Comment: 1 Glass of Scotch a Night   Drug use: Never   Sexual activity: Not Currently  Other  Topics Concern   Not on file  Social History Narrative   Has 3 children 2 daughters and a son, who are very attentive.    Lives alone in a townhouse.    Able to perform all ADL's   Exercises daily   Belongs to social clubs   Does line dancing.    Social Determinants of Health   Financial Resource Strain: Low Risk  (06/22/2023)   Overall Financial Resource Strain (CARDIA)    Difficulty of Paying Living Expenses: Not hard at all  Food Insecurity: No Food Insecurity (06/22/2023)   Hunger Vital Sign    Worried About Running Out of Food in the Last Year: Never true    Ran Out of Food in the Last Year: Never true  Transportation Needs: No Transportation Needs (06/22/2023)   PRAPARE - Administrator, Civil Service (Medical): No    Lack of Transportation (Non-Medical): No  Physical Activity: Inactive (06/22/2023)   Exercise Vital Sign    Days of Exercise per Week: 0 days    Minutes of Exercise per Session: 0 min  Stress: No Stress Concern Present (06/22/2023)   [employer] of Occupational Health - Occupational Stress Questionnaire    Feeling of Stress : Not at all  Social Connections: Moderately Isolated (06/22/2023)   Social Connection and Isolation Panel [NHANES]    Frequency of Communication with Friends and Family: More than three times a week    Frequency of Social Gatherings with Friends and Family: Three times a week    Attends Religious Services: More than 4 times per year    Active Member of Clubs or Organizations: No    Attends Banker Meetings: Never    Marital Status: Widowed    Tobacco Counseling Counseling given: Not Answered   Clinical Intake:  Pre-visit preparation completed: Yes  Pain : No/denies pain     Diabetes: No  How often do you need to have someone help you when you read instructions, pamphlets, or other written materials from your doctor or pharmacy?: 1 - Never  Interpreter Needed?: No  Information entered by ::  Remi Haggard LPN   Activities of Daily Living    06/22/2023   11:18 AM 08/17/2022   11:40 AM  In your present state of health, do you have any difficulty performing the following activities:  Hearing? 1 1  Vision? 0 0  Difficulty concentrating or making decisions? 0 0  Walking or climbing stairs? 0 0  Doing errands, shopping?  0  Preparing Food and eating ? N N  Using the Toilet? N N  In the past six months, have you accidently leaked urine? Malvin Johns  Do  you have problems with loss of bowel control? N N  Managing your Medications? N N  Managing your Finances? N N  Housekeeping or managing your Housekeeping? N N    Patient Care Team: Shade Flood, MD as PCP - General (Family Medicine) Jens Som Madolyn Frieze, MD as PCP - Cardiology (Cardiology)  Indicate any recent Medical Services you may have received from other than [facility] providers in the past year (date may be approximate).     Assessment:   This is a routine wellness examination for [place].  Hearing/Vision screen Hearing Screening - Comments:: Bilateral hearing aids Vision Screening - Comments:: [facility] Up to date   Goals Addressed             This Visit's Progress    Increase physical activity   On track    Patient Stated       Would like to get headaches under control       Depression Screen    06/22/2023   11:21 AM 04/20/2023    2:16 PM 09/26/2022    1:28 PM 08/17/2022   11:42 AM 02/28/2022    1:13 PM 10/25/2021    1:39 PM 07/22/2021   11:33 AM  PHQ 2/9 Scores  PHQ - 2 Score 0 0 0 0 0 0 0  PHQ- 9 Score 0 0 0 0 0 0     Fall Risk    06/22/2023   11:16 AM 04/20/2023    2:16 PM 09/26/2022    1:27 PM 08/17/2022   11:35 AM 06/03/2022   11:17 AM  Fall Risk   Falls in the past year? 0 0 0 0 0  Number falls in past yr: 0 0 0 0 0  Injury with Fall? 0 0 0 0 0  Risk for fall due to :  No Fall Risks No Fall Risks    Follow up Falls evaluation completed;Education provided;Falls  prevention discussed Falls evaluation completed Falls evaluation completed Falls evaluation completed;Education provided;Falls prevention discussed     MEDICARE RISK AT HOME: Medicare Risk at Home Any stairs in or around the home?: Yes (has chair lyft) If so, are there any without handrails?: No Home free of loose throw rugs in walkways, pet beds, electrical cords, etc?: Yes Adequate lighting in your home to reduce risk of falls?: Yes Life alert?: Yes Use of a cane, walker or w/c?: No Grab bars in the bathroom?: Yes Shower chair or bench in shower?: No Elevated toilet seat or a handicapped toilet?: No  TIMED UP AND GO:  Was the test performed?  No    Cognitive Function:        06/22/2023   11:21 AM 08/17/2022   11:38 AM 11/20/2019    1:13 PM  6CIT Screen  What Year? 0 points 0 points 0 points  What month? 0 points 0 points 0 points  What time? 0 points 0 points 0 points  Count back from 20 0 points 0 points 2 points  Months in reverse 0 points 0 points 2 points  Repeat phrase 0 points 0 points 0 points  Total Score 0 points 0 points 4 points    Immunizations Immunization History  Administered Date(s) Administered   PFIZER Comirnaty(Gray Top)Covid-19 Tri-Sucrose Vaccine 04/03/2020, 04/30/2020    TDAP status: Due, Education has been provided regarding the importance of this vaccine. Advised may receive this vaccine at local pharmacy or Health Dept. Aware to provide a copy of the vaccination record if  obtained from local pharmacy or Health Dept. Verbalized acceptance and understanding.  Flu Vaccine status: Declined, Education has been provided regarding the importance of this vaccine but patient still declined. Advised may receive this vaccine at local pharmacy or Health Dept. Aware to provide a copy of the vaccination record if obtained from local pharmacy or Health Dept. Verbalized acceptance and understanding.  Pneumococcal vaccine status: Declined,  Education has been  provided regarding the importance of this vaccine but patient still declined. Advised may receive this vaccine at local pharmacy or Health Dept. Aware to provide a copy of the vaccination record if obtained from local pharmacy or Health Dept. Verbalized acceptance and understanding.   Covid-19 vaccine status: Declined, Education has been provided regarding the importance of this vaccine but patient still declined. Advised may receive this vaccine at local pharmacy or Health Dept.or vaccine clinic. Aware to provide a copy of the vaccination record if obtained from local pharmacy or Health Dept. Verbalized acceptance and understanding.  Qualifies for Shingles Vaccine? Yes   Zostavax completed No   Shingrix Completed?: No.    Education has been provided regarding the importance of this vaccine. Patient has been advised to call insurance company to determine out of pocket expense if they have not yet received this vaccine. Advised may also receive vaccine at local pharmacy or Health Dept. Verbalized acceptance and understanding.  Screening Tests Health Maintenance  Topic Date Due   COVID-19 Vaccine (3 - 2023-24 season) 07/08/2023 (Originally 05/07/2023)   Zoster Vaccines- Shingrix (1 of 2) 09/22/2023 (Originally 02/01/1975)   INFLUENZA VACCINE  12/04/2023 (Originally 04/06/2023)   DTaP/Tdap/Td (1 - Tdap) 06/21/2024 (Originally 02/01/1944)   Pneumonia Vaccine 84+ Years old (1 of 1 - PCV) 06/21/2024 (Originally 02/02/1992)   Medicare Annual Wellness (AWV)  06/21/2024   DEXA SCAN  Completed   HPV VACCINES  Aged Out    Health Maintenance  There are no preventive care reminders to display for this patient.   Colorectal cancer screening: No longer required.   Mammogram status: No longer required due to age.  Bone Density status: Completed 2023. Results reflect: Bone density results: OSTEOPOROSIS. Repeat every 2 years.  Lung Cancer Screening: (Low Dose CT Chest recommended if Age 61-80 years, 20  pack-year currently smoking OR have quit w/in 15years.) does not qualify.   Lung Cancer Screening Referral:   Additional Screening:  Hepatitis C Screening Never done  Vision Screening: Recommended annual ophthalmology exams for early detection of glaucoma and other disorders of the eye. Is the patient up to date with their annual eye exam?  Yes  Who is the provider or what is the name of the office in which the patient attends annual eye exams? [facility] If pt is not established with a provider, would they like to be referred to a provider to establish care? No .   Dental Screening: Recommended annual dental exams for proper oral hygiene    Community Resource Referral / Chronic Care Management: CRR required this visit?  No   CCM required this visit?  No     Plan:     I have personally reviewed and noted the following in the patient's chart:   Medical and social history Use of alcohol, tobacco or illicit drugs  Current medications and supplements including opioid prescriptions. Patient is not currently taking opioid prescriptions. Functional ability and status Nutritional status Physical activity Advanced directives List of other physicians Hospitalizations, surgeries, and ER visits in previous 12 months Vitals Screenings to include cognitive,  depression, and falls Referrals and appointments  In addition, I have reviewed and discussed with patient certain preventive protocols, quality metrics, and best practice recommendations. A written personalized care plan for preventive services as well as general preventive health recommendations were provided to patient.     Remi Haggard, LPN   28/41/3244   After Visit Summary: (MyChart) Due to this being a telephonic visit, the after visit summary with patients personalized plan was offered to patient via MyChart   Nurse Notes:

## 2023-06-27 DIAGNOSIS — H353231 Exudative age-related macular degeneration, bilateral, with active choroidal neovascularization: Secondary | ICD-10-CM | POA: Diagnosis not present

## 2023-07-31 DIAGNOSIS — H353211 Exudative age-related macular degeneration, right eye, with active choroidal neovascularization: Secondary | ICD-10-CM | POA: Diagnosis not present

## 2023-07-31 DIAGNOSIS — H353221 Exudative age-related macular degeneration, left eye, with active choroidal neovascularization: Secondary | ICD-10-CM | POA: Diagnosis not present

## 2023-07-31 DIAGNOSIS — H353231 Exudative age-related macular degeneration, bilateral, with active choroidal neovascularization: Secondary | ICD-10-CM | POA: Diagnosis not present

## 2023-09-19 DIAGNOSIS — H35033 Hypertensive retinopathy, bilateral: Secondary | ICD-10-CM | POA: Diagnosis not present

## 2023-09-19 DIAGNOSIS — H43813 Vitreous degeneration, bilateral: Secondary | ICD-10-CM | POA: Diagnosis not present

## 2023-09-19 DIAGNOSIS — H353231 Exudative age-related macular degeneration, bilateral, with active choroidal neovascularization: Secondary | ICD-10-CM | POA: Diagnosis not present

## 2023-10-16 ENCOUNTER — Telehealth: Payer: Self-pay | Admitting: Family Medicine

## 2023-10-16 NOTE — Telephone Encounter (Signed)
 I do not see any encounters where someone called from [facility]. Since they did not reach her at the time, they should call back. I do see she has an appt with Dr. Ester Helms on 2/17 so it could've been the automated call reminder.

## 2023-10-16 NOTE — Telephone Encounter (Signed)
 Copied from CRM [id number]. Topic: General - Other >> Oct 16, 2023  9:15 AM [place] A wrote: Reason for CRM: Patient said she received a call from office, she was returning the call back

## 2023-10-17 DIAGNOSIS — H353231 Exudative age-related macular degeneration, bilateral, with active choroidal neovascularization: Secondary | ICD-10-CM | POA: Diagnosis not present

## 2023-10-23 ENCOUNTER — Ambulatory Visit: Payer: Medicare Other | Admitting: Family Medicine

## 2023-11-14 DIAGNOSIS — H353231 Exudative age-related macular degeneration, bilateral, with active choroidal neovascularization: Secondary | ICD-10-CM | POA: Diagnosis not present

## 2023-11-20 ENCOUNTER — Ambulatory Visit: Payer: Medicare Other | Admitting: Family Medicine

## 2023-12-19 DIAGNOSIS — Z961 Presence of intraocular lens: Secondary | ICD-10-CM | POA: Diagnosis not present

## 2023-12-19 DIAGNOSIS — H43813 Vitreous degeneration, bilateral: Secondary | ICD-10-CM | POA: Diagnosis not present

## 2023-12-19 DIAGNOSIS — H353231 Exudative age-related macular degeneration, bilateral, with active choroidal neovascularization: Secondary | ICD-10-CM | POA: Diagnosis not present

## 2023-12-19 DIAGNOSIS — H35033 Hypertensive retinopathy, bilateral: Secondary | ICD-10-CM | POA: Diagnosis not present

## 2023-12-21 ENCOUNTER — Ambulatory Visit: Admitting: Family Medicine

## 2024-01-01 ENCOUNTER — Encounter: Payer: Self-pay | Admitting: Family Medicine

## 2024-01-01 ENCOUNTER — Ambulatory Visit (INDEPENDENT_AMBULATORY_CARE_PROVIDER_SITE_OTHER): Admitting: Family Medicine

## 2024-01-01 VITALS — BP 126/70 | HR 75 | Wt 111.0 lb

## 2024-01-01 DIAGNOSIS — I10 Essential (primary) hypertension: Secondary | ICD-10-CM | POA: Diagnosis not present

## 2024-01-01 DIAGNOSIS — R0981 Nasal congestion: Secondary | ICD-10-CM

## 2024-01-01 MED ORDER — AMLODIPINE BESYLATE 2.5 MG PO TABS
2.5000 mg | ORAL_TABLET | Freq: Three times a day (TID) | ORAL | 1 refills | Status: DC
Start: 2024-01-01 — End: 2024-07-03

## 2024-01-01 NOTE — Progress Notes (Signed)
 Subjective:  Patient ID: Gina Pace, female    DOB: Jan 27, 1925  Age: 89 y.o. MRN: [id number]  CC:  Chief Complaint  Patient presents with   Medical Management of Chronic Issues    No concerns   Patient is not fasting     HPI Gina Pace presents for   Follow up for chronic medical conditions.   Chronic sinus congestion.  Using saline ns - this has been helpful.  Did not tolerate Rx ns.   Hypertension: Amlodipine  2.5mg  tid, occasional missed evening dose, but usually tid. No side effects.  Cardiology eval in 05/2023. Moderate mitral, aortic, tricuspid regurgitation.  Echo 10/8: EF 55-60%. No changes.  Hx of PVC without recent palpitations.  Home readings:none.   CKD, eGFR 56.31 on 8/20 - stable past few years.   BP Readings from Last 3 Encounters:  01/01/24 126/70  05/16/23 112/76  04/20/23 138/76   Lab Results  Component Value Date   CREATININE 0.87 04/25/2023   Lab Results  Component Value Date   NA 136 04/25/2023   K 4.3 04/25/2023   CL 102 04/25/2023   CO2 27 04/25/2023   Hyperparathyroid Followed by endocrine - Dr. Rosalea Collin. OV 11/2022 - 1 year follow up. Avoiding otc calcium. Drinking water - rare lightheadedness in the morning - nasal congestion only - no nearsyncope. Some nighttime urination.   History Patient Active Problem List   Diagnosis Date Noted   Hypercalcemia 04/19/2021   Closed fracture of proximal end of left humerus 02/19/2020   Pain in joint of left shoulder 01/22/2020   Essential hypertension 02/14/2019   Dizziness and giddiness 02/14/2019   Past Medical History:  Diagnosis Date   Allergy    Cataract    GI bleed    Hypertension 02/2019   Newly diagnosed    Osteoporosis    Tuberculosis    Ulcer of gastric fundus, acute    Past Surgical History:  Procedure Laterality Date   ABDOMINAL HYSTERECTOMY     APPENDECTOMY     Bladder     Bladder tack   EYE SURGERY     Rectum     Rectal tear repair   Allergies   Allergen Reactions   Chocolate Anaphylaxis    Migraines    Prednisone  Hypertension   Shellfish Allergy    Chocolate Flavoring Agent (Non-Screening)    Clindamycin    Clindamycin/Lincomycin    Codeine    Erythromycin    Erythromycin Base    Keflex  [Cephalexin ]    Lisinopril      Angioedema    Other    Penicillins    Sulfa Antibiotics    Prior to Admission medications   Medication Sig Start Date End Date Taking? Authorizing Provider  Acetaminophen  325 MG CAPS Take 325 mg by mouth daily as needed.   Yes [provider]  amLODipine  (NORVASC ) 2.5 MG tablet Take 1 tablet (2.5 mg total) by mouth in the morning, at noon, and at bedtime. 04/20/23  Yes Benjiman Bras, MD  fexofenadine (ALLEGRA) 60 MG tablet Take 60 mg by mouth daily. Take 1/2 tablet twice daily   Yes [provider]  ipratropium (ATROVENT ) 0.06 % nasal spray Place 1-2 sprays into both nostrils in the morning and at bedtime. As needed for nasal congestion 06/02/22  Yes Benjiman Bras, MD  famotidine  (PEPCID ) 20 MG tablet Take 1/2 tablet twice daily. Patient not taking: Reported on 05/16/2023 07/07/22   Brian Campanile, MD   Social History  Socioeconomic History   Marital status: Widowed    Spouse name: Not on file   Number of children: 3   Years of education: Not on file   Highest education level: Not on file  Occupational History   Occupation: Book Biomedical engineer    Comment: Retired    Occupation: Arboriculturist     Comment: Retired   Tobacco Use   Smoking status: Never   Smokeless tobacco: Never  Vaping Use   Vaping status: Never Used  Substance and Sexual Activity   Alcohol use: Yes    Comment: 1 Glass of Scotch a Night   Drug use: Never   Sexual activity: Not Currently  Other Topics Concern   Not on file  Social History Narrative   Has 3 children 2 daughters and a son, who are very attentive.    Lives alone in a townhouse.    Able to perform all ADL's   Exercises daily    Belongs to social clubs   Does line dancing.    Social Drivers of Corporate investment banker Strain: Low Risk  (06/22/2023)   Overall Financial Resource Strain (CARDIA)    Difficulty of Paying Living Expenses: Not hard at all  Food Insecurity: No Food Insecurity (06/22/2023)   Hunger Vital Sign    Worried About Running Out of Food in the Last Year: Never true    Ran Out of Food in the Last Year: Never true  Transportation Needs: No Transportation Needs (06/22/2023)   PRAPARE - Administrator, Civil Service (Medical): No    Lack of Transportation (Non-Medical): No  Physical Activity: Inactive (06/22/2023)   Exercise Vital Sign    Days of Exercise per Week: 0 days    Minutes of Exercise per Session: 0 min  Stress: No Stress Concern Present (06/22/2023)   [employer] of Occupational Health - Occupational Stress Questionnaire    Feeling of Stress : Not at all  Social Connections: Moderately Isolated (06/22/2023)   Social Connection and Isolation Panel [NHANES]    Frequency of Communication with Friends and Family: More than three times a week    Frequency of Social Gatherings with Friends and Family: Three times a week    Attends Religious Services: More than 4 times per year    Active Member of Clubs or Organizations: No    Attends Banker Meetings: Never    Marital Status: Widowed  Intimate Partner Violence: Not At Risk (06/22/2023)   Humiliation, Afraid, Rape, and Kick questionnaire    Fear of Current or Ex-Partner: No    Emotionally Abused: No    Physically Abused: No    Sexually Abused: No    Review of Systems  Constitutional:  Negative for fatigue and unexpected weight change.  Respiratory:  Negative for chest tightness and shortness of breath.   Cardiovascular:  Negative for chest pain, palpitations and leg swelling.  Gastrointestinal:  Negative for abdominal pain and blood in stool.  Neurological:  Negative for dizziness, syncope,  light-headedness and headaches.     Objective:   Vitals:   01/01/24 1345  BP: 126/70  Pulse: 75  SpO2: 97%  Weight: 111 lb (50.3 kg)     Physical Exam Vitals reviewed.  Constitutional:      Appearance: Normal appearance. She is well-developed.  HENT:     Head: Normocephalic and atraumatic.  Eyes:     Conjunctiva/sclera: Conjunctivae normal.     Pupils: Pupils are equal, round, and reactive  to light.  Neck:     Vascular: No carotid bruit.  Cardiovascular:     Rate and Rhythm: Normal rate and regular rhythm.     Heart sounds: Normal heart sounds.  Pulmonary:     Effort: Pulmonary effort is normal.     Breath sounds: Normal breath sounds.  Abdominal:     Palpations: Abdomen is soft. There is no pulsatile mass.     Tenderness: There is no abdominal tenderness.  Musculoskeletal:     Right lower leg: No edema.     Left lower leg: No edema.  Skin:    General: Skin is warm and dry.  Neurological:     Mental Status: She is alert and oriented to person, place, and time.  Psychiatric:        Mood and Affect: Mood normal.        Behavior: Behavior normal.        Assessment & Plan:  Gina Pace is a 89 y.o. female . Nasal congestion  Essential hypertension - Plan: amLODipine  (NORVASC ) 2.5 MG tablet, Basic metabolic panel with GFR, CANCELED: Basic metabolic panel with GFR  Hypercalcemia  Stable, tolerating current regimen. Medications refilled. Labs pending as above.  Follow up recommended with endocrinology for hypercalcemia.  Continue saline ns for nasal congestion with rtc precautions.   Recheck in 6 months.    Meds ordered this encounter  Medications   amLODipine  (NORVASC ) 2.5 MG tablet    Sig: Take 1 tablet (2.5 mg total) by mouth in the morning, at noon, and at bedtime.    Dispense:  270 tablet    Refill:  1   Patient Instructions  You appear to be due for follow up with Dr. Rosalea Collin for your parathyroid . Please call and schedule appointment.    Abby Helayne Lo, MD Endocrinologist in [place],   Address: [address], [place], [place] [address] Phone: [phone]  You are doing well. No changes form me at this time.Take care!     Signed,   Caro Christmas, MD [facility], [facility] 01/01/24 2:56 PM

## 2024-01-01 NOTE — Patient Instructions (Addendum)
 You appear to be due for follow up with Dr. Rosalea Collin for your parathyroid . Please call and schedule appointment.   Abby Helayne Lo, MD Endocrinologist in [place], [place]  Address: [address], [place], [place] [address] Phone: [phone]  You are doing well. No changes form me at this time.Take care!

## 2024-01-02 ENCOUNTER — Encounter: Payer: Self-pay | Admitting: Family Medicine

## 2024-01-02 LAB — BASIC METABOLIC PANEL WITH GFR
BUN: 18 mg/dL (ref 6–23)
CO2: 29 meq/L (ref 19–32)
Calcium: 10.4 mg/dL (ref 8.4–10.5)
Chloride: 103 meq/L (ref 96–112)
Creatinine, Ser: 0.88 mg/dL (ref 0.40–1.20)
GFR: 55.28 mL/min — ABNORMAL LOW (ref >60.00)
Glucose, Bld: 80 mg/dL (ref 70–99)
Potassium: 4.7 meq/L (ref 3.5–5.1)
Sodium: 137 meq/L (ref 135–145)

## 2024-01-23 DIAGNOSIS — H353231 Exudative age-related macular degeneration, bilateral, with active choroidal neovascularization: Secondary | ICD-10-CM | POA: Diagnosis not present

## 2024-02-20 DIAGNOSIS — H353231 Exudative age-related macular degeneration, bilateral, with active choroidal neovascularization: Secondary | ICD-10-CM | POA: Diagnosis not present

## 2024-03-26 DIAGNOSIS — H353231 Exudative age-related macular degeneration, bilateral, with active choroidal neovascularization: Secondary | ICD-10-CM | POA: Diagnosis not present

## 2024-04-30 DIAGNOSIS — H43813 Vitreous degeneration, bilateral: Secondary | ICD-10-CM | POA: Diagnosis not present

## 2024-04-30 DIAGNOSIS — H35033 Hypertensive retinopathy, bilateral: Secondary | ICD-10-CM | POA: Diagnosis not present

## 2024-04-30 DIAGNOSIS — Z961 Presence of intraocular lens: Secondary | ICD-10-CM | POA: Diagnosis not present

## 2024-04-30 DIAGNOSIS — H353231 Exudative age-related macular degeneration, bilateral, with active choroidal neovascularization: Secondary | ICD-10-CM | POA: Diagnosis not present

## 2024-05-07 NOTE — Progress Notes (Signed)
 HPI: FU hypertension.  Patient does have a history of angioedema with lisinopril .  Also with history of PVCs.  Echocardiogram October 2024 showed normal LV function, grade 2 diastolic dysfunction, mild RV dysfunction, severe left atrial enlargement, moderate mitral regurgitation, mild to moderate tricuspid regurgitation, moderate aortic insufficiency, mildly dilated ascending aorta at 39 mm.  Since last seen, patient denies dyspnea, chest pain, palpitations or syncope.  Current Outpatient Medications  Medication Sig Dispense Refill   Acetaminophen  325 MG CAPS Take 325 mg by mouth daily as needed.     amLODipine  (NORVASC ) 2.5 MG tablet Take 1 tablet (2.5 mg total) by mouth in the morning, at noon, and at bedtime. (Patient taking differently: Take 2.5 mg by mouth in the morning and at bedtime.) 270 tablet 1   fexofenadine (ALLEGRA) 60 MG tablet Take 60 mg by mouth daily. Take 1/2 tablet twice daily     famotidine  (PEPCID ) 20 MG tablet Take 1/2 tablet twice daily. (Patient not taking: Reported on 05/17/2024) 30 tablet 5   ipratropium (ATROVENT ) 0.06 % nasal spray Place 1-2 sprays into both nostrils in the morning and at bedtime. As needed for nasal congestion (Patient not taking: Reported on 05/17/2024) 15 mL 5   No current facility-administered medications for this visit.     Past Medical History:  Diagnosis Date   Allergy    Cataract    GI bleed    Hypertension 02/2019   Newly diagnosed    Osteoporosis    Tuberculosis    Ulcer of gastric fundus, acute     Past Surgical History:  Procedure Laterality Date   ABDOMINAL HYSTERECTOMY     APPENDECTOMY     Bladder     Bladder tack   EYE SURGERY     Rectum     Rectal tear repair    Social History   Socioeconomic History   Marital status: Widowed    Spouse name: Not on file   Number of children: 3   Years of education: Not on file   Highest education level: Not on file  Occupational History   Occupation: Book Biomedical engineer     Comment: Retired    Occupation: Arboriculturist     Comment: Retired   Tobacco Use   Smoking status: Never   Smokeless tobacco: Never  Vaping Use   Vaping status: Never Used  Substance and Sexual Activity   Alcohol use: Yes    Comment: 1 Glass of Scotch a Night   Drug use: Never   Sexual activity: Not Currently  Other Topics Concern   Not on file  Social History Narrative   Has 3 children 2 daughters and a son, who are very attentive.    Lives alone in a townhouse.    Able to perform all ADL's   Exercises daily   Belongs to social clubs   Does line dancing.    Social Drivers of Corporate investment banker Strain: Low Risk  (06/22/2023)   Overall Financial Resource Strain (CARDIA)    Difficulty of Paying Living Expenses: Not hard at all  Food Insecurity: No Food Insecurity (06/22/2023)   Hunger Vital Sign    Worried About Running Out of Food in the Last Year: Never true    Ran Out of Food in the Last Year: Never true  Transportation Needs: No Transportation Needs (06/22/2023)   PRAPARE - Administrator, Civil Service (Medical): No    Lack of Transportation (Non-Medical): No  Physical Activity: Inactive (06/22/2023)   Exercise Vital Sign    Days of Exercise per Week: 0 days    Minutes of Exercise per Session: 0 min  Stress: No Stress Concern Present (06/22/2023)   [employer] of Occupational Health - Occupational Stress Questionnaire    Feeling of Stress : Not at all  Social Connections: Moderately Isolated (06/22/2023)   Social Connection and Isolation Panel    Frequency of Communication with Friends and Family: More than three times a week    Frequency of Social Gatherings with Friends and Family: Three times a week    Attends Religious Services: More than 4 times per year    Active Member of Clubs or Organizations: No    Attends Banker Meetings: Never    Marital Status: Widowed  Intimate Partner Violence: Not At Risk (06/22/2023)    Humiliation, Afraid, Rape, and Kick questionnaire    Fear of Current or Ex-Partner: No    Emotionally Abused: No    Physically Abused: No    Sexually Abused: No    Family History  Problem Relation Age of Onset   CAD Mother 52   Heart attack Mother    CAD Father 37   Heart attack Father    Congestive Heart Failure Sister    CAD Sister        Hx of CABG   Hypercalcemia Sister    Emphysema Brother     ROS: no fevers or chills, productive cough, hemoptysis, dysphasia, odynophagia, melena, hematochezia, dysuria, hematuria, rash, seizure activity, orthopnea, PND, pedal edema, claudication. Remaining systems are negative.  Physical Exam: Well-developed well-nourished in no acute distress.  Skin is warm and dry.  HEENT is normal.  Neck is supple.  Chest is clear to auscultation with normal expansion.  Cardiovascular exam is regular rate and rhythm.  Abdominal exam nontender or distended. No masses palpated. Extremities show no edema. neuro grossly intact  EKG Interpretation Date/Time:  Friday May 17 2024 11:34:54 EDT Ventricular Rate:  71 PR Interval:  204 QRS Duration:  138 QT Interval:  444 QTC Calculation: 482 R Axis:   -72  Text Interpretation: Normal sinus rhythm with sinus arrhythmia with 1st degree A-V block Right bundle branch block Left anterior fascicular block Bifascicular block Confirmed by Pietro Rogue ([id number]) on 05/17/2024 11:45:19 AM    A/P  1 hypertension-patient's blood pressure is controlled.  Continue present medications and follow.  2 valvular heart disease-patient noted to have moderate aortic and mitral regurgitation on most recent echocardiogram.  She remains asymptomatic with no increased dyspnea.  Given her age would like to be conservative.  She is in agreement.  Will repeat echocardiogram in October to reassess LV function.  3 history of PVCs-patient denies palpitations.  Rogue Pietro, MD

## 2024-05-17 ENCOUNTER — Ambulatory Visit: Attending: Cardiology | Admitting: Cardiology

## 2024-05-17 ENCOUNTER — Encounter: Payer: Self-pay | Admitting: Cardiology

## 2024-05-17 VITALS — BP 140/66 | HR 71 | Ht 60.0 in | Wt 111.4 lb

## 2024-05-17 DIAGNOSIS — I340 Nonrheumatic mitral (valve) insufficiency: Secondary | ICD-10-CM | POA: Diagnosis not present

## 2024-05-17 DIAGNOSIS — I351 Nonrheumatic aortic (valve) insufficiency: Secondary | ICD-10-CM

## 2024-05-17 DIAGNOSIS — I10 Essential (primary) hypertension: Secondary | ICD-10-CM | POA: Diagnosis not present

## 2024-05-17 DIAGNOSIS — R002 Palpitations: Secondary | ICD-10-CM

## 2024-05-17 NOTE — Patient Instructions (Signed)

## 2024-06-04 DIAGNOSIS — H353231 Exudative age-related macular degeneration, bilateral, with active choroidal neovascularization: Secondary | ICD-10-CM | POA: Diagnosis not present

## 2024-06-26 ENCOUNTER — Ambulatory Visit (HOSPITAL_COMMUNITY)
Admission: RE | Admit: 2024-06-26 | Discharge: 2024-06-26 | Disposition: A | Discharge status: Home / Self Care | Source: Ambulatory Visit | Attending: Cardiology | Admitting: Cardiology

## 2024-06-26 DIAGNOSIS — I340 Nonrheumatic mitral (valve) insufficiency: Secondary | ICD-10-CM | POA: Diagnosis not present

## 2024-06-26 DIAGNOSIS — I351 Nonrheumatic aortic (valve) insufficiency: Secondary | ICD-10-CM | POA: Insufficient documentation

## 2024-06-26 LAB — ECHOCARDIOGRAM COMPLETE
Area-P 1/2: 5.02 cm2
MV M vel: 5.75 m/s
MV Peak grad: 132.3 mmHg
P 1/2 time: 445 ms
Radius: 0.67 cm
S' Lateral: 2.6 cm

## 2024-06-27 ENCOUNTER — Other Ambulatory Visit (HOSPITAL_COMMUNITY)

## 2024-06-27 ENCOUNTER — Ambulatory Visit: Payer: Self-pay | Admitting: Cardiology

## 2024-06-27 NOTE — Progress Notes (Signed)
 Name: Gina Pace  MRN/ DOB: [id number], 12/15/1924    Age/ Sex: 89 y.o., female     PCP: Levora Reyes SAUNDERS, MD   Reason for Endocrinology Evaluation: Primary Hyperparathyroidism     Initial Endocrinology Clinic Visit: 03/09/2021    PATIENT IDENTIFIER: Gina Pace is a 89 y.o., female with a past medical history of osteoporosis . She has followed with [facility] since 03/09/2021 for consultative assistance with management of her Primary hyperparathyroidism.   HISTORICAL SUMMARY: The patient was first noted with hypercalcemia intermittently since June 2020.  Serum calcium max level of 10.6 mg/DL in January 2021 (and corrected).  She has also been noted with elevated PTH of 74 PG/mL in April 2022.  She has a diagnosis of osteoporosis with a T score of -3.5 at the spine on 11/09/2021 She does have a history of humeral fracture in 2021   She was seen by Dr. Kassie from July 2022 until February 2023.  She had no prior 24-hour urinary collection for calcium excretion at the time   She has no history of nephrolithiasis   Patient declines antiresorptive therapy as she is sensitive to medication, she understands her high risk for bone fractures  SUBJECTIVE:    Today (06/28/2024):  Ms. Gina Pace is here for primary hyperparathyroidism and osteoporosis.    Pt follows with cardiology for valvular heart disease  Has polyuria at  night and  polydipsia in the morning  She sustained a fall a few weeks ago, no fractures  No renal stones Has occasional constipation  No OTC calcium She consumes  ~   2 dietary calcium daily     Vitamin D3 1000 IU - not daily ~ 3x a week   HISTORY:  Past Medical History:  Past Medical History:  Diagnosis Date   Allergy    Cataract    GI bleed    Hypertension 02/2019   Newly diagnosed    Osteoporosis    Tuberculosis    Ulcer of gastric fundus, acute    Past Surgical History:  Past Surgical History:  Procedure  Laterality Date   ABDOMINAL HYSTERECTOMY     APPENDECTOMY     Bladder     Bladder tack   EYE SURGERY     Rectum     Rectal tear repair   Social History:  reports that she has never smoked. She has never used smokeless tobacco. She reports current alcohol use. She reports that she does not use drugs. Family History:  Family History  Problem Relation Age of Onset   CAD Mother 4   Heart attack Mother    CAD Father 16   Heart attack Father    Congestive Heart Failure Sister    CAD Sister        Hx of CABG   Hypercalcemia Sister    Emphysema Brother      HOME MEDICATIONS: Allergies as of 06/28/2024       Reactions   Chocolate Anaphylaxis   Migraines   Prednisone  Hypertension   Shellfish Allergy    Chocolate Flavoring Agent (non-screening)    Clindamycin    Clindamycin/lincomycin    Codeine    Erythromycin    Erythromycin Base    Keflex  [cephalexin ]    Lisinopril     Angioedema   Other    Penicillins    Sulfa Antibiotics         Medication List        Accurate as of June 28, 2024 12:19 PM. If you have any questions, ask your nurse or doctor.          Acetaminophen  325 MG Caps Take 325 mg by mouth daily as needed.   amLODipine  2.5 MG tablet Commonly known as: NORVASC  Take 1 tablet (2.5 mg total) by mouth in the morning, at noon, and at bedtime.   famotidine  20 MG tablet Commonly known as: PEPCID  Take 1/2 tablet twice daily.   fexofenadine 60 MG tablet Commonly known as: ALLEGRA Take 60 mg by mouth daily. Take 1/2 tablet twice daily   ipratropium 0.06 % nasal spray Commonly known as: ATROVENT  Place 1-2 sprays into both nostrils in the morning and at bedtime. As needed for nasal congestion          OBJECTIVE:   PHYSICAL EXAM: VS: BP 130/84 (BP Location: Left Arm, Patient Position: Sitting, Cuff Size: Normal)   Pulse 93   Ht 5' (1.524 m)   Wt 111 lb 3.2 oz (50.4 kg)   SpO2 97%   BMI 21.72 kg/m    EXAM: General: Pt appears well  and is in NAD  Eyes: External eye exam normal without stare, lid lag or exophthalmos.  EOM intact.    Neck: General: Supple without adenopathy. Thyroid : Thyroid  size normal.  No goiter or nodules appreciated.   Lungs: Clear with good BS bilat   Heart: Auscultation: RRR.  Extremities:  BL LE: No pretibial edema normal ROM and strength.  Mental Status: Judgment, insight: Intact Orientation: Oriented to time, place, and person Mood and affect: No depression, anxiety, or agitation     DATA REVIEWED:  Latest Reference Range & Units 06/28/24 12:53  Sodium 135 - 146 mmol/L 137  Potassium 3.5 - 5.3 mmol/L 4.6  Chloride 98 - 110 mmol/L 103  CO2 20 - 32 mmol/L 28  Glucose 65 - 99 mg/dL 87  BUN 7 - 25 mg/dL 18  Creatinine 9.39 - 9.04 mg/dL 9.13  Calcium 8.6 - 89.5 mg/dL 89.5  BUN/Creatinine Ratio 6 - 22 (calc) SEE NOTE:  eGFR > OR = 60 mL/min/1.12m2 61  Vitamin D , 25-Hydroxy 30 - 100 ng/mL 33    Latest Reference Range & Units 06/28/24 12:53  PTH, Intact 16 - 77 pg/mL 147 (H)    DXA 11/09/2021  Study Result  Narrative & Impression  Date of study: 11/09/2021 Exam: DUAL X-RAY ABSORPTIOMETRY (DXA) FOR BONE MINERAL DENSITY (BMD) Instrument: [employer] Requesting Provider: Dr. Kassie Indication: screening for osteoporosis in patient with hypercalcemia Comparison: none (please note that it is not possible to compare data from different instruments) Clinical data: Pt is a 89 y.o. female with history of humeral fracture fracture.  On vitamin D .   Results:   Lumbar spine L1-L4 Femoral neck (FN) 33% distal radius  T-score -3.5 RFN: -3.0 LFN: -2.7 n/a    Assessment: Patient has OSTEOPOROSIS according to the [facility] classification for osteoporosis (see below).    ASSESSMENT / PLAN / RECOMMENDATIONS:   Primary hyperparathyroidism:   - Not a surgical candidate due to advanced age  - Patient asymptomatic - Unable to proceed with 24-hour urinary collection due to incontinence, will  proceed with KUB, x-ray order has been provided to the patient today with the address - Serum calcium remains within normal range as well as GFR and vitamin D  - PTH elevated  Recommendations  Stay Hydrated  Avoid over the counter calcium tablets  Maintain 2-3 servings of dietary calcium daily    2. Osteoporosis :  -  Patient declines antiresorptive therapy   F/U in 1 yr     Signed electronically by: Stefano Redgie Butts, MD  [facility]  [facility] [address] [place] [place], [place] [address] Phone: [phone] FAX: [phone]      CC: Levora Reyes SAUNDERS, MD [id number] [address] Phone: [phone]  Fax: [phone]   Return to Endocrinology clinic as below: Future Appointments  Date Time Provider Department Center  07/03/2024  1:20 PM Levora Reyes SAUNDERS, MD [facility] [place]  07/16/2024 11:30 AM [facility] ANNUAL WELLNESS VISIT [facility] [place]

## 2024-06-28 ENCOUNTER — Ambulatory Visit (INDEPENDENT_AMBULATORY_CARE_PROVIDER_SITE_OTHER): Admitting: Internal Medicine

## 2024-06-28 ENCOUNTER — Encounter: Payer: Self-pay | Admitting: Internal Medicine

## 2024-06-28 ENCOUNTER — Other Ambulatory Visit

## 2024-06-28 VITALS — BP 130/84 | HR 93 | Ht 60.0 in | Wt 111.2 lb

## 2024-06-28 DIAGNOSIS — E213 Hyperparathyroidism, unspecified: Secondary | ICD-10-CM | POA: Diagnosis not present

## 2024-06-28 NOTE — Patient Instructions (Addendum)
 Stay Hydrated during the day  Avoid over the counter calcium tablets  Maintain 2-3 servings of dietary calcium daily     Please take the  order to [facility] imaging located at   [address] [place], [place] [address]  Phone [phone]

## 2024-06-29 LAB — BASIC METABOLIC PANEL WITH GFR
BUN: 18 mg/dL (ref 7–25)
CO2: 28 mmol/L (ref 20–32)
Calcium: 10.4 mg/dL (ref 8.6–10.4)
Chloride: 103 mmol/L (ref 98–110)
Creat: 0.86 mg/dL (ref 0.60–0.95)
Glucose, Bld: 87 mg/dL (ref 65–99)
Potassium: 4.6 mmol/L (ref 3.5–5.3)
Sodium: 137 mmol/L (ref 135–146)
eGFR: 61 mL/min/1.73m2 (ref >=60)

## 2024-06-29 LAB — VITAMIN D 25 HYDROXY (VIT D DEFICIENCY, FRACTURES): Vit D, 25-Hydroxy: 33 ng/mL (ref 30–100)

## 2024-06-29 LAB — PARATHYROID HORMONE, INTACT (NO CA): PTH: 147 pg/mL — ABNORMAL HIGH (ref 16–77)

## 2024-07-01 ENCOUNTER — Ambulatory Visit: Payer: Self-pay | Admitting: Internal Medicine

## 2024-07-03 ENCOUNTER — Ambulatory Visit: Admitting: Family Medicine

## 2024-07-03 ENCOUNTER — Telehealth: Admitting: Family Medicine

## 2024-07-03 DIAGNOSIS — I10 Essential (primary) hypertension: Secondary | ICD-10-CM

## 2024-07-03 DIAGNOSIS — R0981 Nasal congestion: Secondary | ICD-10-CM

## 2024-07-03 MED ORDER — AMLODIPINE BESYLATE 2.5 MG PO TABS: 2.5000 mg | ORAL_TABLET | Freq: Two times a day (BID) | ORAL | 1 refills | Status: AC

## 2024-07-03 NOTE — Progress Notes (Signed)
 Virtual Visit via Telephone Note - no video capability  I connected with Gina Pace on 07/03/24 at 1:46 PM by telephone and verified that I am speaking with the correct person using two identifiers. Patient location: home My location: in office.    I discussed the limitations, risks, security and privacy concerns of performing an evaluation and management service by telephone and the availability of in person appointments. I also discussed with the patient that there may be a patient responsible charge related to this service. The patient expressed understanding and agreed to proceed, consent obtained  Chief complaint: Chief Complaint  Patient presents with   Medical Management of Chronic Issues    6 month med recheck      History of Present Illness:  Chronic nasal congestion Unfortunately has not tolerated prescription nasal sprays but saline nasal spray has been helpful.  Continues to use saline mist - helpful.   Hypertension: Treated with amlodipine  2.5 mg 3 times daily.  Ultimately arrived at this dose for tolerability in the past and has done well, with only occasional missed doses.  Moderate mitral aortic and tricuspid regurgitation with evaluation by cardiology in September 2024, EF 55 to 60% on prior echo and history of PVCs but no recent palpitations.  Denies any side effects with medications.   CKD with eGFR overall stable past few years.   Home readings:none.  Stressed at time of last visit with higher BP than usual. No new symptoms. No effects. Taking amlodipine  2 times per day - felt lightheaded if on 3rd dose.   BP Readings from Last 3 Encounters:  06/28/24 130/84  05/17/24 (!) 140/66  01/01/24 126/70   Lab Results  Component Value Date   CREATININE 0.86 06/28/2024   Hyperparathyroidism Followed by endocrinology, Dr. Sam.  Avoidance of over-the-counter calcium supplementation.  Recent office visit October 24.  Noted blood pressure of 130/84 at  that time at in person visit.  Continued hydration and avoidance of over-the-counter calcium tablets but maintain 2-3 servings of dietary calcium daily.  Not candidate for surgery due to advanced age.  Asymptomatic.   Declines all vaccines.     Patient Active Problem List   Diagnosis Date Noted   Hypercalcemia 04/19/2021   Closed fracture of proximal end of left humerus 02/19/2020   Pain in joint of left shoulder 01/22/2020   Essential hypertension 02/14/2019   Dizziness and giddiness 02/14/2019   Past Medical History:  Diagnosis Date   Allergy    Cataract    GI bleed    Hypertension 02/2019   Newly diagnosed    Osteoporosis    Tuberculosis    Ulcer of gastric fundus, acute    Past Surgical History:  Procedure Laterality Date   ABDOMINAL HYSTERECTOMY     APPENDECTOMY     Bladder     Bladder tack   EYE SURGERY     Rectum     Rectal tear repair   Allergies  Allergen Reactions   Chocolate Anaphylaxis    Migraines    Prednisone  Hypertension   Shellfish Allergy    Chocolate Flavoring Agent (Non-Screening)    Clindamycin    Clindamycin/Lincomycin    Codeine    Erythromycin    Erythromycin Base    Keflex  [Cephalexin ]    Lisinopril      Angioedema    Other    Penicillins    Sulfa Antibiotics    Prior to Admission medications   Medication Sig Start Date End Date Taking?  Authorizing Provider  Acetaminophen  325 MG CAPS Take 325 mg by mouth daily as needed.    [provider]  amLODipine  (NORVASC ) 2.5 MG tablet Take 1 tablet (2.5 mg total) by mouth in the morning, at noon, and at bedtime. 01/01/24   Levora Reyes SAUNDERS, MD  famotidine  (PEPCID ) 20 MG tablet Take 1/2 tablet twice daily. Patient not taking: Reported on 05/17/2024 07/07/22   Jeneal Danita Macintosh, MD  fexofenadine (ALLEGRA) 60 MG tablet Take 60 mg by mouth daily. Take 1/2 tablet twice daily    [provider]  ipratropium (ATROVENT ) 0.06 % nasal spray Place 1-2 sprays into both nostrils  in the morning and at bedtime. As needed for nasal congestion Patient not taking: Reported on 05/17/2024 06/02/22   Levora Reyes SAUNDERS, MD   Social History   Socioeconomic History   Marital status: Widowed    Spouse name: Not on file   Number of children: 3   Years of education: Not on file   Highest education level: Not on file  Occupational History   Occupation: Book biomedical engineer    Comment: Retired    Occupation: Arboriculturist     Comment: Retired   Tobacco Use   Smoking status: Never   Smokeless tobacco: Never  Vaping Use   Vaping status: Never Used  Substance and Sexual Activity   Alcohol use: Yes    Comment: 1 Glass of Scotch a Night   Drug use: Never   Sexual activity: Not Currently  Other Topics Concern   Not on file  Social History Narrative   Has 3 children 2 daughters and a son, who are very attentive.    Lives alone in a townhouse.    Able to perform all ADL's   Exercises daily   Belongs to social clubs   Does line dancing.    Social Drivers of Corporate Investment Banker Strain: Low Risk  (06/22/2023)   Overall Financial Resource Strain (CARDIA)    Difficulty of Paying Living Expenses: Not hard at all  Food Insecurity: No Food Insecurity (06/22/2023)   Hunger Vital Sign    Worried About Running Out of Food in the Last Year: Never true    Ran Out of Food in the Last Year: Never true  Transportation Needs: No Transportation Needs (06/22/2023)   PRAPARE - Administrator, Civil Service (Medical): No    Lack of Transportation (Non-Medical): No  Physical Activity: Inactive (06/22/2023)   Exercise Vital Sign    Days of Exercise per Week: 0 days    Minutes of Exercise per Session: 0 min  Stress: No Stress Concern Present (06/22/2023)   [employer] of Occupational Health - Occupational Stress Questionnaire    Feeling of Stress : Not at all  Social Connections: Moderately Isolated (06/22/2023)   Social Connection and Isolation Panel     Frequency of Communication with Friends and Family: More than three times a week    Frequency of Social Gatherings with Friends and Family: Three times a week    Attends Religious Services: More than 4 times per year    Active Member of Clubs or Organizations: No    Attends Banker Meetings: Never    Marital Status: Widowed  Intimate Partner Violence: Not At Risk (06/22/2023)   Humiliation, Afraid, Rape, and Kick questionnaire    Fear of Current or Ex-Partner: No    Emotionally Abused: No    Physically Abused: No    Sexually Abused:  No     Observations/Objective: There were no vitals filed for this visit., Normal speech, euthymic mood on audio call.  No apparent difficulty with respiratory effort.  All questions answered with understanding of plan expressed  Assessment and Plan: Nasal congestion  - As above intolerant to prescription nasal sprays.  Doing well with saline nasal spray over-the-counter.  RTC precautions given.  Essential hypertension  - Has been tolerating twice per day amlodipine  2.5 mg, lightheaded with 3 times daily dosing.  Recent blood pressure noted to endocrinology and recent labs noted.  Okay to stay on 2.5 mg twice daily at this time with 6 months in person follow-up.  RTC precautions given, home monitoring is option to watch for trends as well.  Follow Up Instructions:  6 months in person. I discussed the assessment and treatment plan with the patient. The patient was provided an opportunity to ask questions and all were answered. The patient agreed with the plan and demonstrated an understanding of the instructions.   The patient was advised to call back or seek an in-person evaluation if the symptoms worsen or if the condition fails to improve as anticipated.  I provided *** minutes of non-face-to-face time during this encounter.  Signed,   Reyes Pines, MD [facility].  07/03/24

## 2024-07-03 NOTE — Patient Instructions (Signed)
 Thank you for taking the call today. No change in medications at this time - I refilled the amlodipine  for 2 times per day as that has been better tolerated. Follow up with me in 6 months.  Take care!

## 2024-07-04 NOTE — Progress Notes (Signed)
 Pt has been scheduled.

## 2024-07-16 ENCOUNTER — Ambulatory Visit: Payer: Medicare Other | Admitting: *Deleted

## 2024-07-16 VITALS — Ht 60.0 in | Wt 111.0 lb

## 2024-07-16 DIAGNOSIS — Z0000 Encounter for general adult medical examination without abnormal findings: Secondary | ICD-10-CM | POA: Diagnosis not present

## 2024-07-16 NOTE — Progress Notes (Signed)
 Subjective:   Gina Pace is a 89 y.o. female who presents for a Medicare Annual Wellness Visit.  Allergies (verified) Chocolate, Prednisone , Shellfish allergy, Chocolate flavoring agent (non-screening), Clindamycin, Clindamycin/lincomycin, Codeine, Erythromycin, Erythromycin base, Keflex  [cephalexin ], Lisinopril , Other, Penicillins, and Sulfa antibiotics   History: Past Medical History:  Diagnosis Date   Allergy    Cataract    GI bleed    Hypertension 02/2019   Newly diagnosed    Osteoporosis    Tuberculosis    Ulcer of gastric fundus, acute    Past Surgical History:  Procedure Laterality Date   ABDOMINAL HYSTERECTOMY     APPENDECTOMY     Bladder     Bladder tack   EYE SURGERY     Rectum     Rectal tear repair   Family History  Problem Relation Age of Onset   CAD Mother 44   Heart attack Mother    CAD Father 76   Heart attack Father    Congestive Heart Failure Sister    CAD Sister        Hx of CABG   Hypercalcemia Sister    Emphysema Brother    Social History   Occupational History   Occupation: Book biomedical engineer    Comment: Retired    Occupation: Arboriculturist     Comment: Retired   Tobacco Use   Smoking status: Never   Smokeless tobacco: Never  Vaping Use   Vaping status: Never Used  Substance and Sexual Activity   Alcohol use: Yes    Comment: 1 Glass of Scotch a Night   Drug use: Never   Sexual activity: Not Currently   Tobacco Counseling Counseling given: Not Answered  SDOH Screenings   Food Insecurity: No Food Insecurity (07/16/2024)  Housing: Unknown (07/16/2024)  Transportation Needs: No Transportation Needs (07/16/2024)  Utilities: Not At Risk (07/16/2024)  Alcohol Screen: Low Risk  (06/22/2023)  Depression (PHQ2-9): Low Risk  (07/16/2024)  Financial Resource Strain: Low Risk  (06/22/2023)  Physical Activity: Inactive (07/16/2024)  Social Connections: Moderately Isolated (07/16/2024)  Stress: No Stress Concern Present  (07/16/2024)  Tobacco Use: Low Risk  (07/16/2024)  Health Literacy: Adequate Health Literacy (07/16/2024)   Depression Screen    07/16/2024    1:10 PM 01/01/2024    1:47 PM 06/22/2023   11:21 AM 04/20/2023    2:16 PM 09/26/2022    1:28 PM 08/17/2022   11:42 AM 02/28/2022    1:13 PM  PHQ 2/9 Scores  PHQ - 2 Score 0 0 0 0 0 0 0  PHQ- 9 Score 0 0  0  0  0  0  0      Data saved with a previous flowsheet row definition     Goals Addressed             This Visit's Progress    Increase physical activity   On track    Patient Stated   On track    Continue current lifestyle     Patient Stated       Continue current lifestyle       Visit info / Clinical Intake: Medicare Wellness Visit Type:: Subsequent Annual Wellness Visit Persons participating in visit:: patient Medicare Wellness Visit Mode:: Telephone If telephone:: video declined Because this visit was a virtual/telehealth visit:: unable to obtan vitals due to lack of equipment If Telephone or Video please confirm:: I connected with the patient using audio enabled telemedicine application and verified that I am speaking with  the correct person using two identifiers; I discussed the limitations of evaluation and management by telemedicine; The patient expressed understanding and agreed to proceed Patient Location:: home Provider Location:: home Information given by:: patient Interpreter Needed?: No Pre-visit prep was completed: no AWV questionnaire completed by patient prior to visit?: no Living arrangements:: (!) lives alone Patient's Overall Health Status Rating: good Typical amount of pain: none Does pain affect daily life?: no Are you currently prescribed opioids?: no  Dietary Habits and Nutritional Risks How many meals a day?: 4 Eats fruit and vegetables daily?: yes Most meals are obtained by: preparing own meals Diabetic:: no  Functional Status Activities of Daily Living (to include ambulation/medication):  Independent Ambulation: Independent Medication Administration: Independent Home Management: Independent Manage your own finances?: yes Primary transportation is: family/friends Concerns about vision?: no *vision screening is required for WTM* Concerns about hearing?: (!) yes Uses hearing aids?: (!) yes Hear whispered voice?: (!) no *in-person visit only*  Fall Screening Falls in the past year?: 1 Number of falls in past year: 0 Was there an injury with Fall?: 0 Fall Risk Category Calculator: 1 Patient Fall Risk Level: Low Fall Risk  Fall Risk Patient at Risk for Falls Due to: No Fall Risks Fall risk Follow up: Falls evaluation completed; Education provided; Falls prevention discussed  Home and Transportation Safety: All rugs have non-skid backing?: yes All stairs or steps have railings?: yes Grab bars in the bathtub or shower?: (!) no Have non-skid surface in bathtub or shower?: yes Good home lighting?: yes Regular seat belt use?: yes Hospital stays in the last year:: no  Cognitive Assessment Difficulty concentrating, remembering, or making decisions? : no Will 6CIT or Mini Cog be Completed: yes What year is it?: 0 points What month is it?: 0 points Give patient an address phrase to remember (5 components): Its very sunny outside today in November About what time is it?: 0 points Count backwards from 20 to 1: 0 points Say the months of the year in reverse: 0 points Repeat the address phrase from earlier: 0 points 6 CIT Score: 0 points  Advance Directives (For Healthcare) Does Patient Have a Medical Advance Directive?: Yes Type of Advance Directive: Healthcare Power of [place]; Living will Copy of Healthcare Power of Attorney in Chart?: No - copy requested Copy of Living Will in Chart?: No - copy requested  Reviewed/Updated  Reviewed/Updated: Reviewed All (Medical, Surgical, Family, Medications, Allergies, Care Teams, Patient Goals); Surgical History; Family History;  Medications; Allergies; Care Teams; Patient Goals; Medical History        Objective:    Today's Vitals   07/16/24 1310  Weight: 111 lb (50.3 kg)  Height: 5' (1.524 m)   Body mass index is 21.68 kg/m.  Current Medications (verified) Outpatient Encounter Medications as of 07/16/2024  Medication Sig   amLODipine  (NORVASC ) 2.5 MG tablet Take 1 tablet (2.5 mg total) by mouth 2 (two) times daily.   fexofenadine (ALLEGRA) 60 MG tablet Take 60 mg by mouth daily. Take 1/2 tablet twice daily   famotidine  (PEPCID ) 20 MG tablet Take 1/2 tablet twice daily. (Patient not taking: Reported on 05/17/2024)   ipratropium (ATROVENT ) 0.06 % nasal spray Place 1-2 sprays into both nostrils in the morning and at bedtime. As needed for nasal congestion (Patient not taking: Reported on 05/17/2024)   No facility-administered encounter medications on file as of 07/16/2024.   Hearing/Vision screen Hearing Screening - Comments:: Bilateral hearing aids Vision Screening - Comments:: Covic Up to date Immunizations and  Health Maintenance Health Maintenance  Topic Date Due   DTaP/Tdap/Td (1 - Tdap) Never done   Pneumococcal Vaccine: 50+ Years (1 of 2 - PCV) Never done   COVID-19 Vaccine (3 - 2025-26 season) 08/01/2024 (Originally 05/06/2024)   Zoster Vaccines- Shingrix (1 of 2) 10/16/2024 (Originally 02/01/1975)   Influenza Vaccine  12/03/2024 (Originally 04/05/2024)   Medicare Annual Wellness (AWV)  07/16/2025   DEXA SCAN  Completed   Meningococcal B Vaccine  Aged Out        Assessment/Plan:  This is a routine wellness examination for Gina Pace.  Patient Care Team: Levora Reyes SAUNDERS, MD as PCP - General (Family Medicine) Pietro Redell RAMAN, MD as PCP - Cardiology (Cardiology)  I have personally reviewed and noted the following in the patient's chart:   Medical and social history Use of alcohol, tobacco or illicit drugs  Current medications and supplements including opioid prescriptions. Functional ability  and status Nutritional status Physical activity Advanced directives List of other physicians Hospitalizations, surgeries, and ER visits in previous 12 months Vitals Screenings to include cognitive, depression, and falls Referrals and appointments  No orders of the defined types were placed in this encounter.  In addition, I have reviewed and discussed with patient certain preventive protocols, quality metrics, and best practice recommendations. A written personalized care plan for preventive services as well as general preventive health recommendations were provided to patient.   Mliss Graff, LPN   [id number]   Return in 1 year (on 07/16/2025).  After Visit Summary: (MyChart) Due to this being a telephonic visit, the after visit summary with patients personalized plan was offered to patient via MyChart   Nurse Notes:

## 2024-07-16 NOTE — Patient Instructions (Signed)
 Ms. Gina Pace , Thank you for taking time to come for your Medicare Wellness Visit. I appreciate your ongoing commitment to your health goals. Please review the following plan we discussed and let me know if I can assist you in the future.   Screening recommendations/referrals: Colonoscopy:  Mammogram:  Bone Density:  Recommended yearly ophthalmology/optometry visit for glaucoma screening and checkup Recommended yearly dental visit for hygiene and checkup  Vaccinations: Influenza vaccine:  Pneumococcal vaccine:  Tdap vaccine:  Shingles vaccine:         Preventive Care 65 Years and Older, Female Preventive care refers to lifestyle choices and visits with your health care provider that can promote health and wellness. What does preventive care include? A yearly physical exam. This is also called an annual well check. Dental exams once or twice a year. Routine eye exams. Ask your health care provider how often you should have your eyes checked. Personal lifestyle choices, including: Daily care of your teeth and gums. Regular physical activity. Eating a healthy diet. Avoiding tobacco and drug use. Limiting alcohol use. Practicing safe sex. Taking low-dose aspirin every day. Taking vitamin and mineral supplements as recommended by your health care provider. What happens during an annual well check? The services and screenings done by your health care provider during your annual well check will depend on your age, overall health, lifestyle risk factors, and family history of disease. Counseling  Your health care provider may ask you questions about your: Alcohol use. Tobacco use. Drug use. Emotional well-being. Home and relationship well-being. Sexual activity. Eating habits. History of falls. Memory and ability to understand (cognition). Work and work astronomer. Reproductive health. Screening  You may have the following tests or measurements: Height, weight, and  BMI. Blood pressure. Lipid and cholesterol levels. These may be checked every 5 years, or more frequently if you are over 106 years old. Skin check. Lung cancer screening. You may have this screening every year starting at age 32 if you have a 30-pack-year history of smoking and currently smoke or have quit within the past 15 years. Fecal occult blood test (FOBT) of the stool. You may have this test every year starting at age 11. Flexible sigmoidoscopy or colonoscopy. You may have a sigmoidoscopy every 5 years or a colonoscopy every 10 years starting at age 24. Hepatitis C blood test. Hepatitis B blood test. Sexually transmitted disease (STD) testing. Diabetes screening. This is done by checking your blood sugar (glucose) after you have not eaten for a while (fasting). You may have this done every 1-3 years. Bone density scan. This is done to screen for osteoporosis. You may have this done starting at age 77. Mammogram. This may be done every 1-2 years. Talk to your health care provider about how often you should have regular mammograms. Talk with your health care provider about your test results, treatment options, and if necessary, the need for more tests. Vaccines  Your health care provider may recommend certain vaccines, such as: Influenza vaccine. This is recommended every year. Tetanus, diphtheria, and acellular pertussis (Tdap, Td) vaccine. You may need a Td booster every 10 years. Zoster vaccine. You may need this after age 47. Pneumococcal 13-valent conjugate (PCV13) vaccine. One dose is recommended after age 33. Pneumococcal polysaccharide (PPSV23) vaccine. One dose is recommended after age 77. Talk to your health care provider about which screenings and vaccines you need and how often you need them. This information is not intended to replace advice given to you by your  health care provider. Make sure you discuss any questions you have with your health care provider. Document  Released: 09/18/2015 Document Revised: 05/11/2016 Document Reviewed: 06/23/2015 Elsevier Interactive Patient Education  2017 [employer].  Fall Prevention in the Home Falls can cause injuries. They can happen to people of all ages. There are many things you can do to make your home safe and to help prevent falls. What can I do on the outside of my home? Regularly fix the edges of walkways and driveways and fix any cracks. Remove anything that might make you trip as you walk through a door, such as a raised step or threshold. Trim any bushes or trees on the path to your home. Use bright outdoor lighting. Clear any walking paths of anything that might make someone trip, such as rocks or tools. Regularly check to see if handrails are loose or broken. Make sure that both sides of any steps have handrails. Any raised decks and porches should have guardrails on the edges. Have any leaves, snow, or ice cleared regularly. Use sand or salt on walking paths during winter. Clean up any spills in your garage right away. This includes oil or grease spills. What can I do in the bathroom? Use night lights. Install grab bars by the toilet and in the tub and shower. Do not use towel bars as grab bars. Use non-skid mats or decals in the tub or shower. If you need to sit down in the shower, use a plastic, non-slip stool. Keep the floor dry. Clean up any water that spills on the floor as soon as it happens. Remove soap buildup in the tub or shower regularly. Attach bath mats securely with double-sided non-slip rug tape. Do not have throw rugs and other things on the floor that can make you trip. What can I do in the bedroom? Use night lights. Make sure that you have a light by your bed that is easy to reach. Do not use any sheets or blankets that are too big for your bed. They should not hang down onto the floor. Have a firm chair that has side arms. You can use this for support while you get dressed. Do  not have throw rugs and other things on the floor that can make you trip. What can I do in the kitchen? Clean up any spills right away. Avoid walking on wet floors. Keep items that you use a lot in easy-to-reach places. If you need to reach something above you, use a strong step stool that has a grab bar. Keep electrical cords out of the way. Do not use floor polish or wax that makes floors slippery. If you must use wax, use non-skid floor wax. Do not have throw rugs and other things on the floor that can make you trip. What can I do with my stairs? Do not leave any items on the stairs. Make sure that there are handrails on both sides of the stairs and use them. Fix handrails that are broken or loose. Make sure that handrails are as long as the stairways. Check any carpeting to make sure that it is firmly attached to the stairs. Fix any carpet that is loose or worn. Avoid having throw rugs at the top or bottom of the stairs. If you do have throw rugs, attach them to the floor with carpet tape. Make sure that you have a light switch at the top of the stairs and the bottom of the stairs. If you do  not have them, ask someone to add them for you. What else can I do to help prevent falls? Wear shoes that: Do not have high heels. Have rubber bottoms. Are comfortable and fit you well. Are closed at the toe. Do not wear sandals. If you use a stepladder: Make sure that it is fully opened. Do not climb a closed stepladder. Make sure that both sides of the stepladder are locked into place. Ask someone to hold it for you, if possible. Clearly mark and make sure that you can see: Any grab bars or handrails. First and last steps. Where the edge of each step is. Use tools that help you move around (mobility aids) if they are needed. These include: Canes. Walkers. Scooters. Crutches. Turn on the lights when you go into a dark area. Replace any light bulbs as soon as they burn out. Set up your  furniture so you have a clear path. Avoid moving your furniture around. If any of your floors are uneven, fix them. If there are any pets around you, be aware of where they are. Review your medicines with your doctor. Some medicines can make you feel dizzy. This can increase your chance of falling. Ask your doctor what other things that you can do to help prevent falls. This information is not intended to replace advice given to you by your health care provider. Make sure you discuss any questions you have with your health care provider. Document Released: 06/18/2009 Document Revised: 01/28/2016 Document Reviewed: 09/26/2014 Elsevier Interactive Patient Education  2017 [employer].

## 2024-08-23 ENCOUNTER — Encounter: Payer: Self-pay | Admitting: Family Medicine

## 2024-08-26 NOTE — Addendum Note (Signed)
 Addended by: LEVORA PURCHASE R on: 08/26/2024 10:46 AM   Modules accepted: Level of Service

## 2025-01-01 ENCOUNTER — Ambulatory Visit: Admitting: Family Medicine

## 2025-06-26 ENCOUNTER — Ambulatory Visit: Admitting: Internal Medicine
